# Patient Record
Sex: Female | Born: 1960 | Race: White | Hispanic: No | Marital: Married | Smoking: Never smoker
Health system: Southern US, Community
[De-identification: ages and names within clinical notes are randomized; demographics above are authoritative.]

## PROBLEM LIST (undated history)

## (undated) DIAGNOSIS — Z8041 Family history of malignant neoplasm of ovary: Secondary | ICD-10-CM

## (undated) DIAGNOSIS — Z807 Family history of other malignant neoplasms of lymphoid, hematopoietic and related tissues: Secondary | ICD-10-CM

## (undated) DIAGNOSIS — Z803 Family history of malignant neoplasm of breast: Secondary | ICD-10-CM

## (undated) DIAGNOSIS — K219 Gastro-esophageal reflux disease without esophagitis: Secondary | ICD-10-CM

## (undated) DIAGNOSIS — M542 Cervicalgia: Secondary | ICD-10-CM

## (undated) DIAGNOSIS — Z923 Personal history of irradiation: Secondary | ICD-10-CM

## (undated) DIAGNOSIS — F419 Anxiety disorder, unspecified: Secondary | ICD-10-CM

## (undated) DIAGNOSIS — C801 Malignant (primary) neoplasm, unspecified: Secondary | ICD-10-CM

## (undated) DIAGNOSIS — M1990 Unspecified osteoarthritis, unspecified site: Secondary | ICD-10-CM

## (undated) DIAGNOSIS — Z800 Family history of malignant neoplasm of digestive organs: Secondary | ICD-10-CM

## (undated) DIAGNOSIS — E039 Hypothyroidism, unspecified: Secondary | ICD-10-CM

## (undated) HISTORY — PX: CHOLECYSTECTOMY: SHX55

## (undated) HISTORY — DX: Family history of other malignant neoplasms of lymphoid, hematopoietic and related tissues: Z80.7

## (undated) HISTORY — DX: Unspecified osteoarthritis, unspecified site: M19.90

## (undated) HISTORY — DX: Family history of malignant neoplasm of digestive organs: Z80.0

## (undated) HISTORY — PX: TUBAL LIGATION: SHX77

## (undated) HISTORY — PX: ANKLE FRACTURE SURGERY: SHX122

## (undated) HISTORY — DX: Family history of malignant neoplasm of breast: Z80.3

## (undated) HISTORY — DX: Family history of malignant neoplasm of ovary: Z80.41

---

## 1998-09-01 ENCOUNTER — Other Ambulatory Visit: Admission: RE | Admit: 1998-09-01 | Discharge: 1998-09-01 | Payer: Self-pay | Admitting: Obstetrics & Gynecology

## 1999-12-17 ENCOUNTER — Other Ambulatory Visit: Admission: RE | Admit: 1999-12-17 | Discharge: 1999-12-17 | Payer: Self-pay | Admitting: Obstetrics & Gynecology

## 2001-04-03 ENCOUNTER — Other Ambulatory Visit: Admission: RE | Admit: 2001-04-03 | Discharge: 2001-04-03 | Payer: Self-pay | Admitting: Obstetrics & Gynecology

## 2001-04-23 ENCOUNTER — Encounter: Admission: RE | Admit: 2001-04-23 | Discharge: 2001-05-09 | Payer: Self-pay | Admitting: Orthopedic Surgery

## 2002-04-16 ENCOUNTER — Other Ambulatory Visit: Admission: RE | Admit: 2002-04-16 | Discharge: 2002-04-16 | Payer: Self-pay | Admitting: Obstetrics & Gynecology

## 2002-08-20 ENCOUNTER — Ambulatory Visit (HOSPITAL_BASED_OUTPATIENT_CLINIC_OR_DEPARTMENT_OTHER): Admission: RE | Admit: 2002-08-20 | Discharge: 2002-08-20 | Payer: Self-pay | Admitting: Orthopaedic Surgery

## 2003-08-19 ENCOUNTER — Other Ambulatory Visit: Admission: RE | Admit: 2003-08-19 | Discharge: 2003-08-19 | Payer: Self-pay | Admitting: Obstetrics & Gynecology

## 2004-08-23 ENCOUNTER — Other Ambulatory Visit: Admission: RE | Admit: 2004-08-23 | Discharge: 2004-08-23 | Payer: Self-pay | Admitting: Obstetrics & Gynecology

## 2004-12-21 ENCOUNTER — Ambulatory Visit: Payer: Self-pay | Admitting: Family Medicine

## 2005-02-01 ENCOUNTER — Ambulatory Visit: Payer: Self-pay | Admitting: Family Medicine

## 2005-07-27 ENCOUNTER — Ambulatory Visit: Payer: Self-pay | Admitting: Family Medicine

## 2005-09-21 ENCOUNTER — Other Ambulatory Visit: Admission: RE | Admit: 2005-09-21 | Discharge: 2005-09-21 | Payer: Self-pay | Admitting: Obstetrics & Gynecology

## 2006-01-04 ENCOUNTER — Ambulatory Visit: Payer: Self-pay | Admitting: Family Medicine

## 2006-11-17 ENCOUNTER — Ambulatory Visit: Payer: Self-pay | Admitting: Family Medicine

## 2006-11-27 ENCOUNTER — Ambulatory Visit: Payer: Self-pay | Admitting: Family Medicine

## 2010-06-17 ENCOUNTER — Encounter: Admission: RE | Admit: 2010-06-17 | Discharge: 2010-06-17 | Payer: Self-pay | Admitting: Obstetrics & Gynecology

## 2011-01-24 ENCOUNTER — Other Ambulatory Visit: Payer: Self-pay | Admitting: Obstetrics & Gynecology

## 2011-01-27 ENCOUNTER — Other Ambulatory Visit: Payer: Self-pay | Admitting: Obstetrics & Gynecology

## 2011-01-27 DIAGNOSIS — Z1231 Encounter for screening mammogram for malignant neoplasm of breast: Secondary | ICD-10-CM

## 2011-02-02 ENCOUNTER — Ambulatory Visit
Admission: RE | Admit: 2011-02-02 | Discharge: 2011-02-02 | Disposition: A | Discharge status: Home / Self Care | Payer: BC Managed Care – PPO | Source: Ambulatory Visit | Attending: Obstetrics & Gynecology | Admitting: Obstetrics & Gynecology

## 2011-02-02 DIAGNOSIS — Z1231 Encounter for screening mammogram for malignant neoplasm of breast: Secondary | ICD-10-CM

## 2011-03-25 NOTE — Op Note (Signed)
NAME:  Dawn Rogers, Dawn Rogers                          ACCOUNT NO.:  [id number]   MEDICAL RECORD NO.:  [id number]                   PATIENT TYPE:  AMB   LOCATION:  DSC                                  FACILITY:  [facility]   PHYSICIAN:  Lubertha Basque. Jerl Santos, M.D.             DATE OF BIRTH:  1960/12/08   DATE OF PROCEDURE:  DATE OF DISCHARGE:                                 OPERATIVE REPORT   PREOPERATIVE DIAGNOSIS:  Left ankle bimalleolar fracture.   POSTOPERATIVE DIAGNOSIS:  Left ankle bimalleolar fracture.   PROCEDURE:  Open reduction, internal fixation, left ankle bimalleolar  fracture.   ATTENDING SURGEON:  Lubertha Basque. Jerl Santos, M.D.   ASSISTANT:  Lindwood Qua, PA   ANESTHESIA:  General.   INDICATIONS FOR PROCEDURE:  The patient is a 50 year old woman, who fell  about five days ago and twisted her ankle.  She suffered a displaced  bimalleolar ankle fracture.  She was seen in the office a few days ago and  was offered ORIF, in hopes of realigning her joint and minimizing chance of  degenerative arthritis once this heals.  The procedure was discussed with  the patient and informed operative consent was obtained after discussing the  possibility complications of reaction to anesthesia, infection,  neurovascular injury, and nonunion.   DESCRIPTION OF PROCEDURE:  The patient was taken to the operating suite,  where general anesthetic was induced without difficulty.  She was positioned  supine with a bump under the left hip.  She was prepped and draped in the  normal sterile fashion.  After administration of IV antibiotic, the left leg  was elevated, exsanguinated, and a tourniquet inflated about the calf.  A  lateral incision was made with dissection down to the fibular fracture.  This was reduced by manipulation with a crab claw forceps.  Once this was  brought out to length, fluoroscopy was used to confirm adequacy of  reduction.  I then stabilized this with a six hole, one-third tubular  side  plate with good purchase achieved on each of the six screws, three of which  were on either side of the fracture.   Attention was then turned toward the medial aspect.  A separate incision was  made there with dissection down to the large medial malleolar fracture which  was displaced about 5 mm.  This was reduced anatomically and stabilized with  two partially threaded cancellous screws, also from the Synthes set.  Fluoroscopy was again used to confirm adequacy of reduction and placement of  hardware.  We did end up changing one of the lower screws on the fibula  plate, as it appeared to stick into the mortise a millimeter or two.  The  wounds were then irrigated.  I made some final x-ray views and saved these,  which I read myself.  The tourniquet was deflated and a mild amount of  bleeding was easily controlled with  Bovie cautery.  Subcutaneous tissues  were approximated with 2-0 undyed Vicryl and skin was closed with staples on  both sides.  Marcaine was injected about the wounds, followed by Adaptic  with a dry gauze dressing and a posterior splint of plaster with the ankle  in neutral position.  Estimated blood use and intraoperative fluids can be  obtained from Anesthesia records.  Accurate tourniquet which was less than  an hour.   DISPOSITION:  The patient was extubated in the operating room and taken to  recovery room in stable condition.  The plans were for her to go home the  same day and follow up in the office in less than a week.  I will contact  her by phone tonight.                                               Lubertha Basque Jerl Santos, M.D.    PGD/MEDQ  D:  08/20/2002  T:  08/21/2002  Job:  [id number]

## 2011-07-01 ENCOUNTER — Other Ambulatory Visit: Payer: Self-pay | Admitting: Family Medicine

## 2011-07-01 DIAGNOSIS — M5412 Radiculopathy, cervical region: Secondary | ICD-10-CM

## 2011-07-04 ENCOUNTER — Ambulatory Visit
Admission: RE | Admit: 2011-07-04 | Discharge: 2011-07-04 | Disposition: A | Discharge status: Home / Self Care | Payer: BC Managed Care – PPO | Source: Ambulatory Visit | Attending: Family Medicine | Admitting: Family Medicine

## 2011-07-04 DIAGNOSIS — M5412 Radiculopathy, cervical region: Secondary | ICD-10-CM

## 2011-07-18 ENCOUNTER — Ambulatory Visit: Payer: BC Managed Care – PPO | Attending: Family Medicine | Admitting: Physical Therapy

## 2011-07-18 DIAGNOSIS — M2560 Stiffness of unspecified joint, not elsewhere classified: Secondary | ICD-10-CM | POA: Insufficient documentation

## 2011-07-18 DIAGNOSIS — IMO0001 Reserved for inherently not codable concepts without codable children: Secondary | ICD-10-CM | POA: Insufficient documentation

## 2011-07-18 DIAGNOSIS — M542 Cervicalgia: Secondary | ICD-10-CM | POA: Insufficient documentation

## 2011-07-18 DIAGNOSIS — R5381 Other malaise: Secondary | ICD-10-CM | POA: Insufficient documentation

## 2011-07-21 ENCOUNTER — Ambulatory Visit: Payer: BC Managed Care – PPO | Admitting: Physical Therapy

## 2011-07-26 ENCOUNTER — Ambulatory Visit: Payer: BC Managed Care – PPO | Admitting: Physical Therapy

## 2011-07-29 ENCOUNTER — Ambulatory Visit: Payer: BC Managed Care – PPO | Admitting: *Deleted

## 2011-08-01 ENCOUNTER — Ambulatory Visit: Payer: BC Managed Care – PPO | Admitting: Physical Therapy

## 2011-08-03 ENCOUNTER — Ambulatory Visit: Payer: BC Managed Care – PPO | Admitting: Physical Therapy

## 2011-08-04 ENCOUNTER — Encounter: Payer: BC Managed Care – PPO | Admitting: Physical Therapy

## 2011-08-09 ENCOUNTER — Ambulatory Visit: Payer: BC Managed Care – PPO | Attending: Family Medicine | Admitting: *Deleted

## 2011-08-09 DIAGNOSIS — R5381 Other malaise: Secondary | ICD-10-CM | POA: Insufficient documentation

## 2011-08-09 DIAGNOSIS — M2560 Stiffness of unspecified joint, not elsewhere classified: Secondary | ICD-10-CM | POA: Insufficient documentation

## 2011-08-09 DIAGNOSIS — IMO0001 Reserved for inherently not codable concepts without codable children: Secondary | ICD-10-CM | POA: Insufficient documentation

## 2011-08-09 DIAGNOSIS — M542 Cervicalgia: Secondary | ICD-10-CM | POA: Insufficient documentation

## 2011-08-11 ENCOUNTER — Ambulatory Visit: Payer: BC Managed Care – PPO | Admitting: *Deleted

## 2011-08-15 ENCOUNTER — Ambulatory Visit: Payer: BC Managed Care – PPO | Admitting: Physical Therapy

## 2011-08-18 ENCOUNTER — Ambulatory Visit: Payer: BC Managed Care – PPO | Admitting: *Deleted

## 2011-08-23 ENCOUNTER — Ambulatory Visit: Payer: BC Managed Care – PPO | Admitting: Physical Therapy

## 2011-08-25 ENCOUNTER — Ambulatory Visit: Payer: BC Managed Care – PPO | Admitting: Physical Therapy

## 2011-08-29 ENCOUNTER — Encounter: Payer: BC Managed Care – PPO | Admitting: Physical Therapy

## 2011-08-31 ENCOUNTER — Encounter: Payer: BC Managed Care – PPO | Admitting: Physical Therapy

## 2012-01-13 ENCOUNTER — Other Ambulatory Visit: Payer: Self-pay | Admitting: Obstetrics & Gynecology

## 2012-01-13 DIAGNOSIS — Z1231 Encounter for screening mammogram for malignant neoplasm of breast: Secondary | ICD-10-CM

## 2012-02-07 ENCOUNTER — Ambulatory Visit
Admission: RE | Admit: 2012-02-07 | Discharge: 2012-02-07 | Disposition: A | Discharge status: Home / Self Care | Payer: BC Managed Care – PPO | Source: Ambulatory Visit | Attending: Obstetrics & Gynecology | Admitting: Obstetrics & Gynecology

## 2012-02-07 DIAGNOSIS — Z1231 Encounter for screening mammogram for malignant neoplasm of breast: Secondary | ICD-10-CM

## 2012-12-28 ENCOUNTER — Other Ambulatory Visit: Payer: Self-pay | Admitting: Obstetrics & Gynecology

## 2012-12-28 DIAGNOSIS — Z1231 Encounter for screening mammogram for malignant neoplasm of breast: Secondary | ICD-10-CM

## 2013-02-13 ENCOUNTER — Ambulatory Visit
Admission: RE | Admit: 2013-02-13 | Discharge: 2013-02-13 | Disposition: A | Discharge status: Home / Self Care | Payer: BC Managed Care – PPO | Source: Ambulatory Visit | Attending: Obstetrics & Gynecology | Admitting: Obstetrics & Gynecology

## 2013-02-13 DIAGNOSIS — Z1231 Encounter for screening mammogram for malignant neoplasm of breast: Secondary | ICD-10-CM

## 2014-02-12 ENCOUNTER — Other Ambulatory Visit: Payer: Self-pay | Admitting: Obstetrics & Gynecology

## 2014-02-12 DIAGNOSIS — Z1231 Encounter for screening mammogram for malignant neoplasm of breast: Secondary | ICD-10-CM

## 2014-02-21 ENCOUNTER — Other Ambulatory Visit: Payer: Self-pay | Admitting: Obstetrics & Gynecology

## 2014-02-21 DIAGNOSIS — N644 Mastodynia: Secondary | ICD-10-CM

## 2014-03-03 DIAGNOSIS — E039 Hypothyroidism, unspecified: Secondary | ICD-10-CM | POA: Insufficient documentation

## 2014-03-04 ENCOUNTER — Ambulatory Visit
Admission: RE | Admit: 2014-03-04 | Discharge: 2014-03-04 | Disposition: A | Discharge status: Home / Self Care | Payer: BC Managed Care – PPO | Source: Ambulatory Visit | Attending: Obstetrics & Gynecology | Admitting: Obstetrics & Gynecology

## 2014-03-04 DIAGNOSIS — N644 Mastodynia: Secondary | ICD-10-CM

## 2014-10-03 NOTE — Care Management Note (Signed)
    Page 1 of 1   10/01/2014     5:02:00 PM CARE MANAGEMENT NOTE 10/01/2014  Patient:  Dawn Rogers,Dawn Rogers   Account Number:  [id number]  Date Initiated:  10/01/2014  Documentation initiated by:  Kemaya Dorner  Subjective/Objective Assessment:   hypotension     Action/Plan:   CM to follow for disposition needs   Anticipated DC Date:  10/03/2014   Anticipated DC Plan:  HOME/SELF CARE         Choice offered to / List presented to:             Status of service:  Completed, signed off Medicare Important Message given?  YES (If response is "NO", the following Medicare IM given date fields will be blank) Date Medicare IM given:  10/01/2014 Medicare IM given by:  Saga Balthazar Date Additional Medicare IM given:   Additional Medicare IM given by:    Discharge Disposition:  HOME/SELF CARE  Per UR Regulation:  Reviewed for med. necessity/level of care/duration of stay  If discussed at [place] of Stay Meetings, dates discussed:    Comments:  Dawn Brock RN, BSN, M[facility], CCM  Nurse - Case Manager,  ([facility] [place])  [phone]  10/01/2014 Social:  From home Home DME:  Home oxygen active. Dispo Plan:  Home / [place] CM will continue to monitor for disposition needs

## 2015-03-02 ENCOUNTER — Other Ambulatory Visit: Payer: Self-pay

## 2015-03-02 DIAGNOSIS — Z1231 Encounter for screening mammogram for malignant neoplasm of breast: Secondary | ICD-10-CM

## 2015-03-11 ENCOUNTER — Other Ambulatory Visit: Payer: Self-pay | Admitting: Obstetrics & Gynecology

## 2015-03-11 ENCOUNTER — Other Ambulatory Visit: Payer: Self-pay

## 2015-03-11 ENCOUNTER — Ambulatory Visit
Admission: RE | Admit: 2015-03-11 | Discharge: 2015-03-11 | Disposition: A | Discharge status: Home / Self Care | Payer: BC Managed Care – PPO | Source: Ambulatory Visit

## 2015-03-11 ENCOUNTER — Encounter (INDEPENDENT_AMBULATORY_CARE_PROVIDER_SITE_OTHER): Payer: Self-pay

## 2015-03-11 DIAGNOSIS — R234 Changes in skin texture: Secondary | ICD-10-CM

## 2015-03-11 DIAGNOSIS — Z1231 Encounter for screening mammogram for malignant neoplasm of breast: Secondary | ICD-10-CM

## 2015-10-07 ENCOUNTER — Other Ambulatory Visit: Payer: Self-pay | Admitting: Obstetrics & Gynecology

## 2015-10-07 DIAGNOSIS — N6459 Other signs and symptoms in breast: Secondary | ICD-10-CM

## 2015-10-16 ENCOUNTER — Ambulatory Visit
Admission: RE | Admit: 2015-10-16 | Discharge: 2015-10-16 | Disposition: A | Discharge status: Home / Self Care | Payer: BC Managed Care – PPO | Source: Ambulatory Visit | Attending: Obstetrics & Gynecology | Admitting: Obstetrics & Gynecology

## 2015-10-16 DIAGNOSIS — N6459 Other signs and symptoms in breast: Secondary | ICD-10-CM

## 2016-06-21 ENCOUNTER — Other Ambulatory Visit: Payer: Self-pay | Admitting: Obstetrics & Gynecology

## 2016-06-21 DIAGNOSIS — N6310 Unspecified lump in the right breast, unspecified quadrant: Secondary | ICD-10-CM

## 2016-06-24 ENCOUNTER — Other Ambulatory Visit: Payer: BC Managed Care – PPO

## 2016-06-29 ENCOUNTER — Ambulatory Visit
Admission: RE | Admit: 2016-06-29 | Discharge: 2016-06-29 | Disposition: A | Discharge status: Home / Self Care | Payer: BC Managed Care – PPO | Source: Ambulatory Visit | Attending: Obstetrics & Gynecology | Admitting: Obstetrics & Gynecology

## 2016-06-29 DIAGNOSIS — N6310 Unspecified lump in the right breast, unspecified quadrant: Secondary | ICD-10-CM

## 2016-12-06 ENCOUNTER — Other Ambulatory Visit: Payer: Self-pay | Admitting: Obstetrics & Gynecology

## 2016-12-06 DIAGNOSIS — N6310 Unspecified lump in the right breast, unspecified quadrant: Secondary | ICD-10-CM

## 2017-01-03 ENCOUNTER — Other Ambulatory Visit: Payer: BC Managed Care – PPO

## 2017-01-03 ENCOUNTER — Inpatient Hospital Stay: Admission: RE | Admit: 2017-01-03 | Payer: BC Managed Care – PPO | Source: Ambulatory Visit

## 2017-01-11 ENCOUNTER — Ambulatory Visit
Admission: RE | Admit: 2017-01-11 | Discharge: 2017-01-11 | Disposition: A | Discharge status: Home / Self Care | Payer: BC Managed Care – PPO | Source: Ambulatory Visit | Attending: Obstetrics & Gynecology | Admitting: Obstetrics & Gynecology

## 2017-01-11 DIAGNOSIS — N6310 Unspecified lump in the right breast, unspecified quadrant: Secondary | ICD-10-CM

## 2017-06-02 ENCOUNTER — Other Ambulatory Visit: Payer: Self-pay | Admitting: Obstetrics & Gynecology

## 2017-06-02 DIAGNOSIS — Z1231 Encounter for screening mammogram for malignant neoplasm of breast: Secondary | ICD-10-CM

## 2017-07-06 ENCOUNTER — Ambulatory Visit
Admission: RE | Admit: 2017-07-06 | Discharge: 2017-07-06 | Disposition: A | Discharge status: Home / Self Care | Payer: BC Managed Care – PPO | Source: Ambulatory Visit | Attending: Obstetrics & Gynecology | Admitting: Obstetrics & Gynecology

## 2017-07-06 ENCOUNTER — Encounter: Payer: Self-pay | Admitting: Radiology

## 2017-07-06 DIAGNOSIS — Z1231 Encounter for screening mammogram for malignant neoplasm of breast: Secondary | ICD-10-CM

## 2018-07-11 ENCOUNTER — Other Ambulatory Visit: Payer: Self-pay | Admitting: Obstetrics & Gynecology

## 2018-07-11 DIAGNOSIS — Z1231 Encounter for screening mammogram for malignant neoplasm of breast: Secondary | ICD-10-CM

## 2018-07-12 ENCOUNTER — Ambulatory Visit
Admission: RE | Admit: 2018-07-12 | Discharge: 2018-07-12 | Disposition: A | Discharge status: Home / Self Care | Payer: BC Managed Care – PPO | Source: Ambulatory Visit | Attending: Family Medicine | Admitting: Family Medicine

## 2018-07-12 ENCOUNTER — Other Ambulatory Visit: Payer: Self-pay | Admitting: Family Medicine

## 2018-07-12 ENCOUNTER — Ambulatory Visit
Admission: RE | Admit: 2018-07-12 | Discharge: 2018-07-12 | Disposition: A | Discharge status: Home / Self Care | Payer: BC Managed Care – PPO | Source: Ambulatory Visit | Attending: Obstetrics & Gynecology | Admitting: Obstetrics & Gynecology

## 2018-07-12 DIAGNOSIS — M542 Cervicalgia: Secondary | ICD-10-CM

## 2018-07-12 DIAGNOSIS — Z1231 Encounter for screening mammogram for malignant neoplasm of breast: Secondary | ICD-10-CM

## 2018-07-16 ENCOUNTER — Other Ambulatory Visit: Payer: Self-pay | Admitting: Obstetrics & Gynecology

## 2018-07-16 ENCOUNTER — Other Ambulatory Visit: Payer: Self-pay | Admitting: Family Medicine

## 2018-07-16 DIAGNOSIS — R928 Other abnormal and inconclusive findings on diagnostic imaging of breast: Secondary | ICD-10-CM

## 2018-07-16 DIAGNOSIS — M5090 Cervical disc disorder, unspecified, unspecified cervical region: Secondary | ICD-10-CM

## 2018-07-16 DIAGNOSIS — M542 Cervicalgia: Secondary | ICD-10-CM

## 2018-07-19 ENCOUNTER — Ambulatory Visit
Admission: RE | Admit: 2018-07-19 | Discharge: 2018-07-19 | Disposition: A | Discharge status: Home / Self Care | Payer: BC Managed Care – PPO | Source: Ambulatory Visit | Attending: Obstetrics & Gynecology | Admitting: Obstetrics & Gynecology

## 2018-07-19 ENCOUNTER — Other Ambulatory Visit: Payer: Self-pay | Admitting: Obstetrics & Gynecology

## 2018-07-19 DIAGNOSIS — R928 Other abnormal and inconclusive findings on diagnostic imaging of breast: Secondary | ICD-10-CM

## 2018-07-19 DIAGNOSIS — N6489 Other specified disorders of breast: Secondary | ICD-10-CM

## 2018-07-21 ENCOUNTER — Ambulatory Visit
Admission: RE | Admit: 2018-07-21 | Discharge: 2018-07-21 | Disposition: A | Discharge status: Home / Self Care | Payer: BC Managed Care – PPO | Source: Ambulatory Visit | Attending: Family Medicine | Admitting: Family Medicine

## 2018-07-21 DIAGNOSIS — M5090 Cervical disc disorder, unspecified, unspecified cervical region: Secondary | ICD-10-CM

## 2018-07-21 DIAGNOSIS — M542 Cervicalgia: Secondary | ICD-10-CM

## 2018-07-23 ENCOUNTER — Ambulatory Visit
Admission: RE | Admit: 2018-07-23 | Discharge: 2018-07-23 | Disposition: A | Discharge status: Home / Self Care | Payer: BC Managed Care – PPO | Source: Ambulatory Visit | Attending: Obstetrics & Gynecology | Admitting: Obstetrics & Gynecology

## 2018-07-23 DIAGNOSIS — N6489 Other specified disorders of breast: Secondary | ICD-10-CM

## 2018-07-24 ENCOUNTER — Encounter: Payer: Self-pay | Admitting: Student

## 2018-07-26 ENCOUNTER — Telehealth: Payer: Self-pay | Admitting: Hematology

## 2018-07-26 NOTE — Telephone Encounter (Signed)
Spoke to patient to confirm afternoon [facility] appointment for 9/25, packet will be emailed to patient

## 2018-07-27 ENCOUNTER — Telehealth: Payer: Self-pay | Admitting: Hematology

## 2018-07-27 ENCOUNTER — Encounter: Payer: Self-pay | Admitting: *Deleted

## 2018-07-27 DIAGNOSIS — Z170 Estrogen receptor positive status [ER+]: Principal | ICD-10-CM

## 2018-07-27 DIAGNOSIS — C50212 Malignant neoplasm of upper-inner quadrant of left female breast: Secondary | ICD-10-CM | POA: Insufficient documentation

## 2018-07-27 NOTE — Telephone Encounter (Signed)
Spoke with patient to confirm afternoon BC for 9/25, packet e-mailed to patient

## 2018-07-31 ENCOUNTER — Encounter: Payer: Self-pay | Admitting: General Surgery

## 2018-07-31 NOTE — Progress Notes (Signed)
[place]   [phone] Fax:([phone] [place] Note   Patient Care Team: Dione Housekeeper, MD as PCP - General (Family Medicine) Fanny Skates, MD as Consulting Physician (General Surgery) Truitt Merle, MD as Consulting Physician (Hematology) Gery Pray, MD as Consulting Physician (Radiation Oncology) 08/01/2018  Referral from: [facility]:  Newly diagnosed left breast cancer   Oncology History   Cancer Staging Malignant neoplasm of upper-inner quadrant of left breast in female, estrogen receptor positive ([place]) Staging form: Breast, AJCC 8th Edition - Clinical stage from 07/23/2018: Stage IA (cT1b, cN0, cM0, G1, ER+, PR+, HER2-) - Signed by Truitt Merle, MD on 08/01/2018       Malignant neoplasm of upper-inner quadrant of left breast in female, estrogen receptor positive ([place])   07/13/2018 Mammogram    07/13/2018 Screening Mammogram IMPRESSION: Further evaluation is suggested for possible distortion in the left breast    07/19/2018 Breast US     07/19/2018 Breast US IMPRESSION: 0.6 cm irregular mass with associated architectural distortion in the 9:30 position of the left breast 4 cm from the nipple. Findings are suspicious for malignancy.  Negative for left axillary lymphadenopathy    07/19/2018 Mammogram    07/19/2018 Diagnostic Mammogram IMPRESSION: 0.6 cm irregular mass with associated architectural distortion in the 9:30 position of the left breast 4 cm from the nipple. Findings are suspicious for malignancy.  Negative for left axillary lymphadenopathy.    07/23/2018 Cancer Staging    Staging form: Breast, AJCC 8th Edition - Clinical stage from 07/23/2018: Stage IA (cT1b, cN0, cM0, G1, ER+, PR+, HER2-) - Signed by Truitt Merle, MD on 08/01/2018    07/23/2018 Receptors her2    ADDITIONAL INFORMATION: PROGNOSTIC INDICATORS Results: IMMUNOHISTOCHEMICAL AND MORPHOMETRIC ANALYSIS  PERFORMED MANUALLY The tumor cells are Negative for Her2 (1+). Estrogen Receptor: 90%, POSITIVE, STRONG STAINING INTENSITY Progesterone Receptor: 90%, POSITIVE, STRONG STAINING INTENSITY Proliferation Marker Ki67: 10%    07/23/2018 Pathology Results    Diagnosis Breast, left, needle core biopsy, 9:30 o'clock, 4 cm fn - INVASIVE DUCTAL CARCINOMA, GRADE I. SEE NOTE.    07/27/2018 Initial Diagnosis    Malignant neoplasm of upper-inner quadrant of left breast in female, estrogen receptor positive ([place])      HISTORY OF PRESENTING ILLNESS:  Dawn Rogers 57 y.o. female is here because of newly diagnosed left breast cancer.   She had routine screening mammography on 07/13/2018 at the breast center with results showing possible distortion in the left breast.. She underwent left diagnostic mammography with tomography and left breast ultrasonography at [facility] on 07/19/2018 showing a 0.6 cm irregular mass with associated architectural distortion in the 9:30 position of the left breast 4 cm from the nipple. Findings are suspicious for malignancy. It was negative for left axillary lymphadenopathy.  Accordingly on 07/23/2018 she proceeded to left breast biopsy with pathology confirming left invasive ductal carcinoma.  Today, she is here with her family members.She feels well today and denied feeling tired. She states that she has multiple impinged nerves in her neck, for which she tried steroid injections and PT. She can still use her hands, but states that she feels that she lost some of her hand strength.   Family history is positive for multiple cancers.     GYN HISTORY  Menarchal: 13 LMP: March 2019, was irregular before G2P2: 2 pregnancies   MEDICAL HISTORY:  History reviewed. No pertinent past medical history.  SURGICAL HISTORY:  Past Surgical History:  Procedure Laterality Date  . CHOLECYSTECTOMY    . TUBAL LIGATION      SOCIAL HISTORY: Social History   Socioeconomic  History  . Marital status: Married    Spouse name: Not on file  . Number of children: Not on file  . Years of education: Not on file  . Highest education level: Not on file  Occupational History  . Not on file  Social Needs  . Financial resource strain: Not on file  . Food insecurity:    Worry: Not on file    Inability: Not on file  . Transportation needs:    Medical: Not on file    Non-medical: Not on file  Tobacco Use  . Smoking status: Never Smoker  . Smokeless tobacco: Never Used  Substance and Sexual Activity  . Alcohol use: Never    Frequency: Never  . Drug use: Never  . Sexual activity: Not on file  Lifestyle  . Physical activity:    Days per week: Not on file    Minutes per session: Not on file  . Stress: Not on file  Relationships  . Social connections:    Talks on phone: Not on file    Gets together: Not on file    Attends religious service: Not on file    Active member of club or organization: Not on file    Attends meetings of clubs or organizations: Not on file    Relationship status: Not on file  . Intimate partner violence:    Fear of current or ex partner: Not on file    Emotionally abused: Not on file    Physically abused: Not on file    Forced sexual activity: Not on file  Other Topics Concern  . Not on file  Social History Narrative  . Not on file    FAMILY HISTORY: Family History  Problem Relation Age of Onset  . Breast cancer Sister 84  . Lung cancer Sister   . Leukemia Mother        ? possible leukemia?    . Cancer Paternal Grandfather        unsure kind of cancer  . Lymphoma Daughter   . Cancer Paternal Aunt        unsure kind of cancer   . Lung cancer Paternal Aunt   . Esophageal cancer Father   . Pancreatic cancer Paternal Aunt     ALLERGIES:  has No Known Allergies.  MEDICATIONS:  Current Outpatient Medications  Medication Sig Dispense Refill  . cyclobenzaprine (FLEXERIL) 5 MG tablet Take 5 mg by mouth 3 (three) times  daily as needed for muscle spasms.    . ergocalciferol (VITAMIN D2) 50000 units capsule Take 50,000 Units by mouth once a week.    Marland Kitchen FLUoxetine (PROZAC) 10 MG capsule Take 30 mg by mouth daily.    Marland Kitchen levothyroxine (SYNTHROID, LEVOTHROID) 25 MCG tablet Take 25 mcg by mouth daily before breakfast.    . LORazepam (ATIVAN) 0.5 MG tablet Take 0.5 mg by mouth every 6 (six) hours as needed for anxiety.    Marland Kitchen omeprazole (PRILOSEC) 20 MG capsule Take 20 mg by mouth daily.     No current facility-administered medications for this visit.     REVIEW OF SYSTEMS:   Constitutional: Denies fevers, chills or abnormal night sweats Eyes: Denies blurriness of vision, double vision or watery eyes Ears, nose, mouth, throat, and face: Denies mucositis or sore throat Respiratory: Denies cough, dyspnea  or wheezes Cardiovascular: Denies palpitation, chest discomfort or lower extremity swelling Gastrointestinal:  Denies nausea, heartburn or change in bowel habits Skin: Denies abnormal skin rashes Lymphatics: Denies new lymphadenopathy or easy bruising Neurological:Denies numbness, tingling or new weaknesses (+) cervical nerve impingement with mild bilateral hand weakness Behavioral/Psych: Mood is stable, no new changes  All other systems were reviewed with the patient and are negative.  PHYSICAL EXAMINATION:  ECOG PERFORMANCE STATUS: 0 - Asymptomatic  Vitals:   08/01/18 1318  BP: 139/69  Pulse: (!) 58  Resp: 18  Temp: 98.2 F (36.8 C)  SpO2: 100%   Filed Weights   08/01/18 1318  Weight: 158 lb 12.8 oz (72 kg)    GENERAL:alert, no distress and comfortable SKIN: skin color, texture, turgor are normal, no rashes or significant lesions EYES: normal, conjunctiva are pink and non-injected, sclera clear OROPHARYNX:no exudate, no erythema and lips, buccal mucosa, and tongue normal  NECK: supple, thyroid normal size, non-tender, without nodularity LYMPH:  no palpable lymphadenopathy in the cervical, axillary  or inguinal LUNGS: clear to auscultation and percussion with normal breathing effort HEART: regular rate & rhythm and no murmurs and no lower extremity edema ABDOMEN:abdomen soft, non-tender and normal bowel sounds Musculoskeletal:no cyanosis of digits and no clubbing  PSYCH: alert & oriented x 3 with fluent speech NEURO: no focal motor/sensory deficits BREAST: No palpable masses, skin changes, nipple discharge or retraction, or obvious asymmetry   LABORATORY DATA:  I have reviewed the data as listed CBC Latest Ref Rng & Units 08/01/2018  WBC 3.9 - 10.3 K/uL 6.9  Hemoglobin 11.6 - 15.9 g/dL 13.3  Hematocrit 34.8 - 46.6 % 39.6  Platelets 145 - 400 K/uL 255    CMP Latest Ref Rng & Units 08/01/2018  Glucose 70 - 99 mg/dL 90  BUN 6 - 20 mg/dL 10  Creatinine 0.44 - 1.00 mg/dL 0.83  Sodium 135 - 145 mmol/L 139  Potassium 3.5 - 5.1 mmol/L 4.3  Chloride 98 - 111 mmol/L 105  CO2 22 - 32 mmol/L 27  Calcium 8.9 - 10.3 mg/dL 8.9  Total Protein 6.5 - 8.1 g/dL 6.3(L)  Total Bilirubin 0.3 - 1.2 mg/dL 0.4  Alkaline Phos 38 - 126 U/L 62  AST 15 - 41 U/L 18  ALT 0 - 44 U/L 23    PATHOLOGY:   07/23/2018 Surgical Pathology  ADDITIONAL INFORMATION: PROGNOSTIC INDICATORS Results: IMMUNOHISTOCHEMICAL AND MORPHOMETRIC ANALYSIS PERFORMED MANUALLY The tumor cells are Negative for Her2 (1+). Estrogen Receptor: 90%, POSITIVE, STRONG STAINING INTENSITY Progesterone Receptor: 90%, POSITIVE, STRONG STAINING INTENSITY Proliferation Marker Ki67: 10% REFERENCE RANGE ESTROGEN RECEPTOR NEGATIVE 0% POSITIVE =>1% REFERENCE RANGE PROGESTERONE RECEPTOR NEGATIVE 0% POSITIVE =>1% All controls stained appropriately  Diagnosis Breast, left, needle core biopsy, 9:30 o'clock, 4 cm fn - INVASIVE DUCTAL CARCINOMA, GRADE I. SEE NOTE.  Diagnosis Note Dr. Lyndon Code has reviewed this case and concurs with the above interpretation. Breast prognostic profile is pending and will be reported in an addendum. [facility] was notified on 07/24/18. (NDK:gt, 07/24/18)ical Information Specimen Comment In formalin at 2:30; extracted less than 5 min; 5 mm mass with distortion Specimen(s) Obtained: Breast, left, needle core biopsy, 9:30 o'clock, 4 cm fn Specimen Clinical Information [facility] Received in formalin labeled with the patient's name and "Left breast 9:30 4 cm FN", are 3 core(s) of tan-yellow, fibroadipose tissue, ranging from 0.8 x 0.2 x 0.2 cm to 1.4 x 0.2 x 0.2 cm. The specimen is entirely submitted in  1 cassette(s). Time in formalin 2:30 pm on 07/23/18. Cold ischemic time less than 5 mins. Craig Staggers 07/23/18 ) Stain(s) used in Diagnosis: The following stain(s) were used in diagnosing the case: ER-ACIS, PR-ACIS, Her2 by IHC, KI-67-ACIS. The control(s) stained appropriately.  RADIOGRAPHIC STUDIES: I have personally reviewed the radiological images as listed and agreed with the findings in the report.  07/19/2018 Breast US IMPRESSION: 0.6 cm irregular mass with associated architectural distortion in the 9:30 position of the left breast 4 cm from the nipple. Findings are suspicious for malignancy.  Negative for left axillary lymphadenopathy  07/19/2018 Diagnostic Mammogram IMPRESSION: 0.6 cm irregular mass with associated architectural distortion in the 9:30 position of the left breast 4 cm from the nipple. Findings are suspicious for malignancy.  Negative for left axillary lymphadenopathy.  07/13/2018 Screening Mammogram IMPRESSION: Further evaluation is suggested for possible distortion in the left breast   Dg Cervical Spine 2 Or 3 Views  Result Date: 07/13/2018 CLINICAL DATA:  Neck pain with radiation into both hands. No recent injury. EXAM: CERVICAL SPINE - 2-3 VIEW COMPARISON:  MRI 07/04/2011. FINDINGS: Diffuse severe multilevel degenerative change again noted of the cervical spine. Degenerative changes most prominent C5-C6-C6-C7. 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 No acute bony abnormality identified. No evidence of fracture or dislocation. Biapical pleural thickening noted consistent scarring. IMPRESSION: Diffuse severe multilevel degenerative change again noted throughout the cervical spine. Degenerative changes are most prominent at C5-C6 and C6-C7. 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 No acute abnormality identified. Electronically Signed   By: Marcello Moores  Register   On: 07/13/2018 06:44   Mr Cervical Spine Wo Contrast  Result Date: 07/21/2018 CLINICAL DATA:  Cervical disc disease and neck pain EXAM: MRI CERVICAL SPINE WITHOUT CONTRAST TECHNIQUE: Multiplanar, multisequence MR imaging of the cervical spine was performed. No intravenous contrast was administered. COMPARISON:  07/12/2018 radiography and 07/04/2011 MRI FINDINGS: Alignment: Mild C4-5 anterolisthesis, accentuated by ridging Vertebrae: C3-4 incomplete segmentation across the posterior disc space. No fracture, discitis, or aggressive bone lesion. Mild marrow edema about the left C7-T1 facet. Cord: Negative Posterior Fossa, vertebral arteries, paraspinal tissues: Negative Disc levels: C2-3: Chronic bulky left degenerative facet spurring with moderate left foraminal narrowing. C3-4: Incomplete segmentation. No degenerative changes or impingement C4-5: Asymmetric bulky right degenerative facet spurring. Right uncovertebral ridging. Moderate right foraminal narrowing. Mild spinal stenosis C5-6: Disc narrowing with bulging and endplate/uncovertebral ridging. Biforaminal impingement. Mild noncompressive spinal stenosis. C6-7: Disc narrowing and bulging with asymmetric right uncovertebral ridging. Negative facets. Right foraminal narrowing that is mild by T2 and more moderate based on sagittal and gradient imaging. C7-T1:Facet arthropathy on the left with marrow edema. Negative disc. No impingement IMPRESSION: 1. Diffuse disc and facet degeneration except at the  partially segmented C3-4 level. 2. C2-3 moderate left foraminal narrowing. 3. C4-5 moderate right foraminal narrowing. 4. C5-6 advanced bilateral foraminal narrowing. 5. C6-7 mild to moderate right foraminal narrowing. 6. Mild and noncompressive spinal stenosis at C5-6 and C6-7. Electronically Signed   By: Monte Fantasia M.D.   On: 07/21/2018 10:07   US Breast Ltd Uni Left Inc Axilla  Result Date: 07/19/2018 CLINICAL DATA:  57 year old patient recalled from recent screening mammogram for possible distortion in the left breast. EXAM: DIGITAL DIAGNOSTIC LEFT MAMMOGRAM WITH CAD AND TOMO ULTRASOUND LEFT BREAST COMPARISON:  07/12/2018 ACR Breast Density Category d: The breast tissue is extremely dense, which lowers the sensitivity of mammography. FINDINGS: Spot compression views with tomography of the upper inner  quadrant of the left breast confirm an area of architectural distortion with a central mass. There are a few punctate calcifications within the mass. Mammographic images were processed with CAD. On physical exam, no mass is palpated in the upper inner quadrant of the left breast Targeted ultrasound is performed, showing an irregular hypoechoic mass at 9:30 position 4 cm from the nipple with a feeding vessel along the superficial margin. At least one of the punctate calcifications can be seen within the mass on ultrasound. Mass measures 0.6 x 0.5 x 0.5 cm and causes some distortion of the surrounding tissue. Within approximately 8 mm of this solid mass there is a 5 mm complicated cyst. Ultrasound of the left axilla is negative for lymphadenopathy. IMPRESSION: 0.6 cm irregular mass with associated architectural distortion in the 9:30 position of the left breast 4 cm from the nipple. Findings are suspicious for malignancy. Negative for left axillary lymphadenopathy. RECOMMENDATION: Ultrasound-guided left breast biopsy is recommended and is being scheduled for the patient. Today's findings and the procedure of  breast biopsy were discussed with the patient and her husband today. I have discussed the findings and recommendations with the patient. Results were also provided in writing at the conclusion of the visit. If applicable, a reminder letter will be sent to the patient regarding the next appointment. BI-RADS CATEGORY  5: Highly suggestive of malignancy. Electronically Signed   By: Curlene Dolphin M.D.   On: 07/19/2018 12:58   Mm Diag Breast Tomo Uni Left  Result Date: 07/19/2018 CLINICAL DATA:  57 year old patient recalled from recent screening mammogram for possible distortion in the left breast. EXAM: DIGITAL DIAGNOSTIC LEFT MAMMOGRAM WITH CAD AND TOMO ULTRASOUND LEFT BREAST COMPARISON:  07/12/2018 ACR Breast Density Category d: The breast tissue is extremely dense, which lowers the sensitivity of mammography. FINDINGS: Spot compression views with tomography of the upper inner quadrant of the left breast confirm an area of architectural distortion with a central mass. There are a few punctate calcifications within the mass. Mammographic images were processed with CAD. On physical exam, no mass is palpated in the upper inner quadrant of the left breast Targeted ultrasound is performed, showing an irregular hypoechoic mass at 9:30 position 4 cm from the nipple with a feeding vessel along the superficial margin. At least one of the punctate calcifications can be seen within the mass on ultrasound. Mass measures 0.6 x 0.5 x 0.5 cm and causes some distortion of the surrounding tissue. Within approximately 8 mm of this solid mass there is a 5 mm complicated cyst. Ultrasound of the left axilla is negative for lymphadenopathy. IMPRESSION: 0.6 cm irregular mass with associated architectural distortion in the 9:30 position of the left breast 4 cm from the nipple. Findings are suspicious for malignancy. Negative for left axillary lymphadenopathy. RECOMMENDATION: Ultrasound-guided left breast biopsy is recommended and is  being scheduled for the patient. Today's findings and the procedure of breast biopsy were discussed with the patient and her husband today. I have discussed the findings and recommendations with the patient. Results were also provided in writing at the conclusion of the visit. If applicable, a reminder letter will be sent to the patient regarding the next appointment. BI-RADS CATEGORY  5: Highly suggestive of malignancy. Electronically Signed   By: Curlene Dolphin M.D.   On: 07/19/2018 12:58   Mm 3d Screen Breast Bilateral  Result Date: 07/13/2018 CLINICAL DATA:  Screening. EXAM: DIGITAL SCREENING BILATERAL MAMMOGRAM WITH TOMO AND CAD COMPARISON:  Previous exam(s). ACR Breast Density  Category d: The breast tissue is extremely dense, which lowers the sensitivity of mammography. FINDINGS: In the left breast, possible distortion warrants further evaluation. In the right breast, no findings suspicious for malignancy. Images were processed with CAD. IMPRESSION: Further evaluation is suggested for possible distortion in the left breast. RECOMMENDATION: Diagnostic mammogram and possibly ultrasound of the left breast. (Code:[id number]-L-[id number]) The patient will be contacted regarding the findings, and additional imaging will be scheduled. BI-RADS CATEGORY  0: Incomplete. Need additional imaging evaluation and/or prior mammograms for comparison. Electronically Signed   By: Lovey Newcomer M.D.   On: 07/13/2018 13:17   Mm Clip Placement Left  Result Date: 07/23/2018 CLINICAL DATA:  Ultrasound-guided core needle biopsy was performed of a 5-6 mm mass in the upper inner quadrant of the left breast. EXAM: DIAGNOSTIC LEFT MAMMOGRAM POST ULTRASOUND BIOPSY COMPARISON:  Previous exam(s). FINDINGS: Mammographic images were obtained following ultrasound guided biopsy of left breast mass. The ribbon shaped biopsy clip is satisfactorily positioned within the mass/distortion, and confirmed on tomographic images in the MLO projection. 2D images were  performed in the CC and 90 degree lateral projections, and the clip is in good position. IMPRESSION: Satisfactory position of ribbon shaped biopsy clip in the left breast. Final Assessment: Post Procedure Mammograms for Marker Placement Electronically Signed   By: Curlene Dolphin M.D.   On: 07/23/2018 14:55   [place] Lt Breast Bx W Loc Dev 1st Lesion Img Bx Spec US Guide  Addendum Date: 07/30/2018   ADDENDUM REPORT: 07/25/2018 08:00 ADDENDUM: Pathology revealed GRADE I INVASIVE DUCTAL CARCINOMA, of the LEFT breast, 9:30 o'clock, 4 cm fn. This was found to be concordant by Dr. Curlene Dolphin. Pathology results were discussed with the patient by telephone. The patient reported doing well after the biopsy with tenderness at the site. Post biopsy instructions and care were reviewed and questions were answered. The patient was encouraged to call [facility] for any additional concerns. The patient was referred to [facility] on August 01, 2018. A bilateral breast MRI is recommended due to breast density and family history. Pathology results reported by Roselind Messier, RN on 07/25/2018. Electronically Signed   By: Curlene Dolphin M.D.   On: 07/25/2018 08:00   Result Date: 07/30/2018 CLINICAL DATA:  57 year old patient presents for biopsy of a an approximately 5-6 mm hypoechoic mass in the 9:30 position of the left breast 4 cm from the nipple. EXAM: ULTRASOUND GUIDED LEFT BREAST CORE NEEDLE BIOPSY COMPARISON:  Previous exam(s). FINDINGS: I met with the patient and we discussed the procedure of ultrasound-guided biopsy, including benefits and alternatives. We discussed the high likelihood of a successful procedure. We discussed the risks of the procedure, including infection, bleeding, tissue injury, clip migration, and inadequate sampling. Informed written consent was given. The usual time-out protocol was performed  immediately prior to the procedure. Lesion quadrant: Upper inner quadrant Using sterile technique and 1% Lidocaine as local anesthetic, under direct ultrasound visualization, a 12 gauge spring-loaded device was used to perform biopsy of a hypoechoic mass using a medial approach. At the conclusion of the procedure a tissue marker clip was deployed into the biopsy cavity. Follow up 2 view mammogram was performed and dictated separately. IMPRESSION: Ultrasound guided biopsy of the left breast. No apparent complications. Electronically Signed: By: Curlene Dolphin M.D. On: 07/23/2018 14:43    ASSESSMENT & PLAN:  Dawn Rogers is a 57 y.o. lovely Perimenopausal female  with a history of   1. Malignant Neoplasm of Upper-inner Quadrant of Left Breast. 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 Grade I -She had a screening Mammogram on 07/13/2018, which showed possible distortion in the left breast. -A diagnostic Mammogram on 07/19/2018 showed 0.6 cm irregular mass with associated architectural distortion in the 9:30 position of the left breast 4 cm from the nipple. Findings were suspicious for malignancy. -Pathology from left breast biopsy confirmed invasive ductal carcinoma, grade 1, ER PR strongly positive, and HER-2 negative. -Due to her very dense breast tissue, will obtain a breast MRI  -She saw Dr. Sondra Come and Dr Dalbert Batman. Radiation and surgery treatment options were discussed.  Is a candidate for lumpectomy and sentinel lymph node biopsy. -She also was offered genetic testing due to her strong family history of cancer. -I reviewed her imaging and pathology results and discussed treatment options. I discussed that she will likely not need chemotherapy, given the low-grade small tumor, and favorable biology (ER PR strongly positive and HER-2 negative).  -However if her tumor is more than 1 cm, or grade 2 and >45m, I will obtain Oncotype on her surgical sample to see if she would benefit from adjuvant  chemotherapy. ---Giving her strongly ER and PR positivity of the tumor cells, and her peri-menopause status, I recommend adjuvant endocrine therapy with tamoxifen. The potential side effects, which includes but not limited to, hot flash, skin and vaginal dryness, slightly increased risk of cardiovascular disease and cataract, small risk of thrombosis and endometrial cancer, were discussed with her in great details. Preventive strategies for thrombosis, such as being physically active, using compression stocks, avoid cigarette smoking, etc., were reviewed with her. I also recommend her to follow-up with her gynecologist once a year, and watch for vaginal spotting or bleeding, as a clinically sign of endometrial cancer, etc. She voiced good understanding, and agrees to proceed. Will start after she completes adjuvant breast radiation. -I reviewed her lab today, CBC and CMP unremarkable.   PLAN: -She will have genetic testing  -Breast MRI will be scheduled -She will have surgery with Dr. IDalbert Batmanand I will see her after her radiation  -no need Oncotype unless tumor >1cm or Grade 2-3 with tumor >59mon surgical path    No orders of the defined types were placed in this encounter.   All questions were answered. The patient knows to call the clinic with any problems, questions or concerns. I spent 35 minutes counseling the patient face to face. The total time spent in the appointment was 45 minutes and more than 50% was on counseling.     YaTruitt MerleMD 08/01/2018   I,Dierdre Searlesweik am acting as scribe for Dr. YaTruitt Merle I have reviewed the above documentation for accuracy and completeness, and I agree with the above.

## 2018-08-01 ENCOUNTER — Other Ambulatory Visit: Payer: Self-pay | Admitting: *Deleted

## 2018-08-01 ENCOUNTER — Other Ambulatory Visit: Payer: Self-pay | Admitting: General Surgery

## 2018-08-01 ENCOUNTER — Inpatient Hospital Stay: Payer: BC Managed Care – PPO | Attending: Hematology | Admitting: Hematology

## 2018-08-01 ENCOUNTER — Encounter: Payer: Self-pay | Admitting: Hematology

## 2018-08-01 ENCOUNTER — Inpatient Hospital Stay: Payer: BC Managed Care – PPO

## 2018-08-01 ENCOUNTER — Ambulatory Visit
Admission: RE | Admit: 2018-08-01 | Discharge: 2018-08-01 | Disposition: A | Discharge status: Home / Self Care | Payer: BC Managed Care – PPO | Source: Ambulatory Visit | Attending: Radiation Oncology | Admitting: Radiation Oncology

## 2018-08-01 VITALS — BP 139/69 | HR 58 | Temp 98.2°F | Resp 18 | Ht 64.0 in | Wt 158.8 lb

## 2018-08-01 DIAGNOSIS — C50212 Malignant neoplasm of upper-inner quadrant of left female breast: Secondary | ICD-10-CM

## 2018-08-01 DIAGNOSIS — Z170 Estrogen receptor positive status [ER+]: Principal | ICD-10-CM

## 2018-08-01 LAB — CMP (CANCER CENTER ONLY)
ALT: 23 U/L (ref 0–44)
AST: 18 U/L (ref 15–41)
Albumin: 3.5 g/dL (ref 3.5–5.0)
Alkaline Phosphatase: 62 U/L (ref 38–126)
Anion gap: 7 (ref 5–15)
BUN: 10 mg/dL (ref 6–20)
CO2: 27 mmol/L (ref 22–32)
Calcium: 8.9 mg/dL (ref 8.9–10.3)
Chloride: 105 mmol/L (ref 98–111)
Creatinine: 0.83 mg/dL (ref 0.44–1.00)
Glucose, Bld: 90 mg/dL (ref 70–99)
POTASSIUM: 4.3 mmol/L (ref 3.5–5.1)
Sodium: 139 mmol/L (ref 135–145)
Total Bilirubin: 0.4 mg/dL (ref 0.3–1.2)
Total Protein: 6.3 g/dL — ABNORMAL LOW (ref 6.5–8.1)

## 2018-08-01 LAB — CBC WITH DIFFERENTIAL (CANCER CENTER ONLY)
Basophils Absolute: 0 10*3/uL (ref 0.0–0.1)
Basophils Relative: 1 %
Eosinophils Absolute: 0.1 10*3/uL (ref 0.0–0.5)
Eosinophils Relative: 2 %
HEMATOCRIT: 39.6 % (ref 34.8–46.6)
HEMOGLOBIN: 13.3 g/dL (ref 11.6–15.9)
LYMPHS ABS: 1.8 10*3/uL (ref 0.9–3.3)
Lymphocytes Relative: 27 %
MCH: 32.2 pg (ref 25.1–34.0)
MCHC: 33.5 g/dL (ref 31.5–36.0)
MCV: 96.1 fL (ref 79.5–101.0)
Monocytes Absolute: 0.7 10*3/uL (ref 0.1–0.9)
Monocytes Relative: 10 %
NEUTROS ABS: 4.2 10*3/uL (ref 1.5–6.5)
NEUTROS PCT: 60 %
Platelet Count: 255 10*3/uL (ref 145–400)
RBC: 4.12 MIL/uL (ref 3.70–5.45)
RDW: 12.3 % (ref 11.2–14.5)
WBC: 6.9 10*3/uL (ref 3.9–10.3)

## 2018-08-01 NOTE — Progress Notes (Signed)
Radiation Oncology         [phone]) [phone] ________________________________  Multidisciplinary Breast Oncology Clinic [facility]) Initial Outpatient Consultation  Name: Dawn Rogers MRN: [id number]  Date: 08/01/2018  DOB: [birth year]  LF:[id number], Hollice Espy, MD  Fanny Skates, MD   REFERRING PHYSICIAN: Fanny Skates, MD  DIAGNOSIS: There were no encounter diagnoses. 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 female who is presenting to the office today for evaluation of her newly diagnosed breast cancer. She is doing well overall.   She had routine screening mammography on 07/12/2018 showing a possible abnormality in the left breast. She underwent bilateral diagnostic mammography with tomography and left breast ultrasonography on 07/19/2018 showing: 0.6 cm irregular mass with associated architectural distortion in the 9:30 position of the left breast 4 cm from the nipple. Findings are suspicious for malignancy. Negative for left axillary lymphadenopathy.   Accordingly on 07/23/2018 she proceeded to biopsy of the left breast area in question. The pathology from this procedure showed: Breast, left, needle core biopsy, 9:30 o'clock, 4 cm fn with invasive ductal carcinoma, grade I. Prognostic indicators significant for: estrogen receptor, 90% positive and progesterone receptor, 90% positive, both with strong staining intensity. Proliferation marker Ki67 at 10%. HER2 negative.  She has a family hx of breast cancer in her sister (diagnosed at ~49 y.o.).    The patient was referred today for presentation in  the multidisciplinary conference.  Radiology studies and pathology slides were presented there for review and discussion of treatment options.  A consensus was discussed regarding potential next steps.  PREVIOUS RADIATION THERAPY: No  PAST MEDICAL HISTORY:  has no past medical history on file.    PAST SURGICAL HISTORY: Past Surgical History:  Procedure Laterality Date  . CHOLECYSTECTOMY    . TUBAL LIGATION      FAMILY HISTORY: family history includes Breast cancer (age of onset: 70) in her sister; Cancer in her paternal aunt and paternal grandfather; Esophageal cancer in her father; Leukemia in her mother; Lung cancer in her paternal aunt and sister; Lymphoma in her daughter; Pancreatic cancer in her paternal aunt.  SOCIAL HISTORY:  reports that she has never smoked. She has never used smokeless tobacco. She reports that she does not drink alcohol or use drugs.  ALLERGIES: Patient has no known allergies.  MEDICATIONS:  Current Outpatient Medications  Medication Sig Dispense Refill  . cyclobenzaprine (FLEXERIL) 5 MG tablet Take 5 mg by mouth 3 (three) times daily as needed for muscle spasms.    . ergocalciferol (VITAMIN D2) 50000 units capsule Take 50,000 Units by mouth once a week.    Marland Kitchen FLUoxetine (PROZAC) 10 MG capsule Take 30 mg by mouth daily.    Marland Kitchen levothyroxine (SYNTHROID, LEVOTHROID) 25 MCG tablet Take 25 mcg by mouth daily before breakfast.    . LORazepam (ATIVAN) 0.5 MG tablet Take 0.5 mg by mouth every 6 (six) hours as needed for anxiety.    Marland Kitchen omeprazole (PRILOSEC) 20 MG capsule Take 20 mg by mouth daily.     No current facility-administered medications for this encounter.     REVIEW OF  SYSTEMS:  A 10+ POINT REVIEW OF SYSTEMS WAS OBTAINED including neurology, dermatology, psychiatry, cardiac, respiratory, lymph, extremities, GI, GU, musculoskeletal, constitutional, reproductive, HEENT. All pertinent positives are noted in the HPI. All others are negative.   PHYSICAL  EXAM: Vitals with BMI 08/01/2018  Height 5' 4"   Weight 158 lbs 13 oz  BMI 78.46  Systolic 962  Diastolic 69  Pulse 58  Respirations 18  Lungs are clear to auscultation bilaterally. Heart has regular rate and rhythm. No palpable cervical, supraclavicular, or axillary adenopathy. Abdomen soft, non-tender, normal bowel sounds. Breast: Right breast with no palpable mass, nipple discharge, or bleeding. Fibrocystic changes noted in the central aspect of the breast. Left breast with biopsy site in the UIQ, no palpable mass, nipple discharge or bleeding. Patient has fibrocystic changes centrally to the breast.    KPS = 100  100 - Normal; no complaints; no evidence of disease. 90   - Able to carry on normal activity; minor signs or symptoms of disease. 80   - Normal activity with effort; some signs or symptoms of disease. 61   - Cares for self; unable to carry on normal activity or to do active work. 60   - Requires occasional assistance, but is able to care for most of his personal needs. 50   - Requires considerable assistance and frequent medical care. 51   - Disabled; requires special care and assistance. 88   - Severely disabled; hospital admission is indicated although death not imminent. 1   - Very sick; hospital admission necessary; active supportive treatment necessary. 10   - Moribund; fatal processes progressing rapidly. 0     - Dead  Karnofsky DA, Abelmann [place], Craver LS and Burchenal [facility] [phone]) The use of the nitrogen mustards in the palliative treatment of carcinoma: with particular reference to bronchogenic carcinoma Cancer 1 634-56  LABORATORY DATA:  Lab Results  Component Value Date   WBC 6.9 08/01/2018   HGB 13.3 08/01/2018   HCT 39.6 08/01/2018   MCV 96.1 08/01/2018   PLT 255 08/01/2018   Lab Results  Component Value Date   NA 139 08/01/2018   K 4.3 08/01/2018   CL 105 08/01/2018   CO2 27 08/01/2018   Lab Results  Component Value Date   ALT 23 08/01/2018   AST  18 08/01/2018   ALKPHOS 62 08/01/2018   BILITOT 0.4 08/01/2018    RADIOGRAPHY: Dg Cervical Spine 2 Or 3 Views  Result Date: 07/13/2018 CLINICAL DATA:  Neck pain with radiation into both hands. No recent injury. EXAM: CERVICAL SPINE - 2-3 VIEW COMPARISON:  MRI 07/04/2011. FINDINGS: Diffuse severe multilevel degenerative change again noted of the cervical spine. Degenerative changes most prominent C5-C6-C6-C7. 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 No acute bony abnormality identified. No evidence of fracture or dislocation. Biapical pleural thickening noted consistent scarring. IMPRESSION: Diffuse severe multilevel degenerative change again noted throughout the cervical spine. Degenerative changes are most prominent at C5-C6 and C6-C7. 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 No acute abnormality identified. Electronically Signed   By: Marcello Moores  Register   On: 07/13/2018 06:44   Mr Cervical Spine Wo Contrast  Result Date: 07/21/2018 CLINICAL DATA:  Cervical disc disease and neck pain EXAM: MRI CERVICAL SPINE WITHOUT CONTRAST TECHNIQUE: Multiplanar, multisequence MR imaging of the cervical spine was performed. No intravenous contrast was administered. COMPARISON:  07/12/2018 radiography and 07/04/2011 MRI FINDINGS: Alignment: Mild C4-5 anterolisthesis, accentuated by ridging Vertebrae: C3-4 incomplete segmentation across  the posterior disc space. No fracture, discitis, or aggressive bone lesion. Mild marrow edema about the left C7-T1 facet. Cord: Negative Posterior Fossa, vertebral arteries, paraspinal tissues: Negative Disc levels: C2-3: Chronic bulky left degenerative facet spurring with moderate left foraminal narrowing. C3-4: Incomplete segmentation. No degenerative changes or impingement C4-5: Asymmetric bulky right degenerative facet spurring. Right uncovertebral ridging. Moderate right foraminal narrowing. Mild spinal stenosis C5-6: Disc narrowing with bulging  and endplate/uncovertebral ridging. Biforaminal impingement. Mild noncompressive spinal stenosis. C6-7: Disc narrowing and bulging with asymmetric right uncovertebral ridging. Negative facets. Right foraminal narrowing that is mild by T2 and more moderate based on sagittal and gradient imaging. C7-T1:Facet arthropathy on the left with marrow edema. Negative disc. No impingement IMPRESSION: 1. Diffuse disc and facet degeneration except at the partially segmented C3-4 level. 2. C2-3 moderate left foraminal narrowing. 3. C4-5 moderate right foraminal narrowing. 4. C5-6 advanced bilateral foraminal narrowing. 5. C6-7 mild to moderate right foraminal narrowing. 6. Mild and noncompressive spinal stenosis at C5-6 and C6-7. Electronically Signed   By: Monte Fantasia M.D.   On: 07/21/2018 10:07   US Breast Ltd Uni Left Inc Axilla  Result Date: 07/19/2018 CLINICAL DATA:  57 year old patient recalled from recent screening mammogram for possible distortion in the left breast. EXAM: DIGITAL DIAGNOSTIC LEFT MAMMOGRAM WITH CAD AND TOMO ULTRASOUND LEFT BREAST COMPARISON:  07/12/2018 ACR Breast Density Category d: The breast tissue is extremely dense, which lowers the sensitivity of mammography. FINDINGS: Spot compression views with tomography of the upper inner quadrant of the left breast confirm an area of architectural distortion with a central mass. There are a few punctate calcifications within the mass. Mammographic images were processed with CAD. On physical exam, no mass is palpated in the upper inner quadrant of the left breast Targeted ultrasound is performed, showing an irregular hypoechoic mass at 9:30 position 4 cm from the nipple with a feeding vessel along the superficial margin. At least one of the punctate calcifications can be seen within the mass on ultrasound. Mass measures 0.6 x 0.5 x 0.5 cm and causes some distortion of the surrounding tissue. Within approximately 8 mm of this solid mass there is a 5 mm  complicated cyst. Ultrasound of the left axilla is negative for lymphadenopathy. IMPRESSION: 0.6 cm irregular mass with associated architectural distortion in the 9:30 position of the left breast 4 cm from the nipple. Findings are suspicious for malignancy. Negative for left axillary lymphadenopathy. RECOMMENDATION: Ultrasound-guided left breast biopsy is recommended and is being scheduled for the patient. Today's findings and the procedure of breast biopsy were discussed with the patient and her husband today. I have discussed the findings and recommendations with the patient. Results were also provided in writing at the conclusion of the visit. If applicable, a reminder letter will be sent to the patient regarding the next appointment. BI-RADS CATEGORY  5: Highly suggestive of malignancy. Electronically Signed   By: Curlene Dolphin M.D.   On: 07/19/2018 12:58   Mm Diag Breast Tomo Uni Left  Result Date: 07/19/2018 CLINICAL DATA:  57 year old patient recalled from recent screening mammogram for possible distortion in the left breast. EXAM: DIGITAL DIAGNOSTIC LEFT MAMMOGRAM WITH CAD AND TOMO ULTRASOUND LEFT BREAST COMPARISON:  07/12/2018 ACR Breast Density Category d: The breast tissue is extremely dense, which lowers the sensitivity of mammography. FINDINGS: Spot compression views with tomography of the upper inner quadrant of the left breast confirm an area of architectural distortion with a central mass. There are a few punctate calcifications  within the mass. Mammographic images were processed with CAD. On physical exam, no mass is palpated in the upper inner quadrant of the left breast Targeted ultrasound is performed, showing an irregular hypoechoic mass at 9:30 position 4 cm from the nipple with a feeding vessel along the superficial margin. At least one of the punctate calcifications can be seen within the mass on ultrasound. Mass measures 0.6 x 0.5 x 0.5 cm and causes some distortion of the surrounding  tissue. Within approximately 8 mm of this solid mass there is a 5 mm complicated cyst. Ultrasound of the left axilla is negative for lymphadenopathy. IMPRESSION: 0.6 cm irregular mass with associated architectural distortion in the 9:30 position of the left breast 4 cm from the nipple. Findings are suspicious for malignancy. Negative for left axillary lymphadenopathy. RECOMMENDATION: Ultrasound-guided left breast biopsy is recommended and is being scheduled for the patient. Today's findings and the procedure of breast biopsy were discussed with the patient and her husband today. I have discussed the findings and recommendations with the patient. Results were also provided in writing at the conclusion of the visit. If applicable, a reminder letter will be sent to the patient regarding the next appointment. BI-RADS CATEGORY  5: Highly suggestive of malignancy. Electronically Signed   By: Curlene Dolphin M.D.   On: 07/19/2018 12:58   Mm 3d Screen Breast Bilateral  Result Date: 07/13/2018 CLINICAL DATA:  Screening. EXAM: DIGITAL SCREENING BILATERAL MAMMOGRAM WITH TOMO AND CAD COMPARISON:  Previous exam(s). ACR Breast Density Category d: The breast tissue is extremely dense, which lowers the sensitivity of mammography. FINDINGS: In the left breast, possible distortion warrants further evaluation. In the right breast, no findings suspicious for malignancy. Images were processed with CAD. IMPRESSION: Further evaluation is suggested for possible distortion in the left breast. RECOMMENDATION: Diagnostic mammogram and possibly ultrasound of the left breast. (Code:[id number]-87M) The patient will be contacted regarding the findings, and additional imaging will be scheduled. BI-RADS CATEGORY  0: Incomplete. Need additional imaging evaluation and/or prior mammograms for comparison. Electronically Signed   By: Lovey Newcomer M.D.   On: 07/13/2018 13:17   Mm Clip Placement Left  Result Date: 07/23/2018 CLINICAL DATA:   Ultrasound-guided core needle biopsy was performed of a 5-6 mm mass in the upper inner quadrant of the left breast. EXAM: DIAGNOSTIC LEFT MAMMOGRAM POST ULTRASOUND BIOPSY COMPARISON:  Previous exam(s). FINDINGS: Mammographic images were obtained following ultrasound guided biopsy of left breast mass. The ribbon shaped biopsy clip is satisfactorily positioned within the mass/distortion, and confirmed on tomographic images in the MLO projection. 2D images were performed in the CC and 90 degree lateral projections, and the clip is in good position. IMPRESSION: Satisfactory position of ribbon shaped biopsy clip in the left breast. Final Assessment: Post Procedure Mammograms for Marker Placement Electronically Signed   By: Curlene Dolphin M.D.   On: 07/23/2018 14:55   [place] Lt Breast Bx W Loc Dev 1st Lesion Img Bx Spec US Guide  Addendum Date: 07/30/2018   ADDENDUM REPORT: 07/25/2018 08:00 ADDENDUM: Pathology revealed GRADE I INVASIVE DUCTAL CARCINOMA, of the LEFT breast, 9:30 o'clock, 4 cm fn. This was found to be concordant by Dr. Curlene Dolphin. Pathology results were discussed with the patient by telephone. The patient reported doing well after the biopsy with tenderness at the site. Post biopsy instructions and care were reviewed and questions were answered. The patient was encouraged to call [facility] for any additional concerns. The patient was referred to  [facility] on August 01, 2018. A bilateral breast MRI is recommended due to breast density and family history. Pathology results reported by Roselind Messier, RN on 07/25/2018. Electronically Signed   By: Curlene Dolphin M.D.   On: 07/25/2018 08:00   Result Date: 07/30/2018 CLINICAL DATA:  57 year old patient presents for biopsy of a an approximately 5-6 mm hypoechoic mass in the 9:30 position of the left breast 4 cm from the nipple. EXAM: ULTRASOUND GUIDED LEFT  BREAST CORE NEEDLE BIOPSY COMPARISON:  Previous exam(s). FINDINGS: I met with the patient and we discussed the procedure of ultrasound-guided biopsy, including benefits and alternatives. We discussed the high likelihood of a successful procedure. We discussed the risks of the procedure, including infection, bleeding, tissue injury, clip migration, and inadequate sampling. Informed written consent was given. The usual time-out protocol was performed immediately prior to the procedure. Lesion quadrant: Upper inner quadrant Using sterile technique and 1% Lidocaine as local anesthetic, under direct ultrasound visualization, a 12 gauge spring-loaded device was used to perform biopsy of a hypoechoic mass using a medial approach. At the conclusion of the procedure a tissue marker clip was deployed into the biopsy cavity. Follow up 2 view mammogram was performed and dictated separately. IMPRESSION: Ultrasound guided biopsy of the left breast. No apparent complications. Electronically Signed: By: Curlene Dolphin M.D. 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   Today, I talked to the patient and family about the findings and work-up thus far.  We discussed the natural history of breast cancer and general treatment, highlighting the role of radiotherapy in the management.  We discussed the available radiation techniques, and focused on the details of logistics and delivery.  We reviewed the anticipated acute and late sequelae associated with radiation in this setting.  The patient was encouraged to ask questions that I answered to the best of my ability.    PLAN:  1. Left Lumpectomy with sentinel node procedure 2. MRI of the breasts given the breast density    3. Adjuvant Radiation Therapy 4. Aromatase Inhibitor 5.  Genetics   ------------------------------------------------  Blair Promise, PhD, MD   This document serves as a record of services personally performed by Gery Pray, MD. It was created on his behalf by Wilburn Mylar, a trained medical scribe. The creation of this record is based on the scribe's personal observations and the provider's statements to them. This document has been checked and approved by the attending provider.

## 2018-08-01 NOTE — Progress Notes (Signed)
[place] Psychosocial Distress Screening Spiritual Care  Met with Hester in [facility] to introduce [facility] team/resources, reviewing distress screen per protocol.  The patient scored a 2 on the Psychosocial Distress Thermometer which indicates mild distress. Also assessed for distress and other psychosocial needs.   ONCBCN DISTRESS SCREENING 08/01/2018  Screening Type Initial Screening  Distress experienced in past week (1-10) 2  Emotional problem type Adjusting to illness  Information Concerns Type Lack of info about treatment  Physical Problem type Bathing/dressing;Tingling hands/feet;Skin dry/itchy  Referral to support programs Yes    The patient brought her husband and two daughters to [facility] for support. The patient reported having "peace" and feeling supported by her family and church community. The patient expressed that her distress is now at a 0, since she has received information about her treatment. The patient is not currently interested in utilizing any Patient and Family Support resources, but she is aware of them if a need arises.  Follow up needed: No.  Doris Cheadle, Counseling Intern [phone]

## 2018-08-02 DIAGNOSIS — C801 Malignant (primary) neoplasm, unspecified: Secondary | ICD-10-CM

## 2018-08-02 HISTORY — DX: Malignant (primary) neoplasm, unspecified: C80.1

## 2018-08-03 ENCOUNTER — Telehealth: Payer: Self-pay | Admitting: *Deleted

## 2018-08-03 NOTE — Telephone Encounter (Signed)
Spoke with pt concerning [place] from 9.25.19. Denies questions or concerns regarding dx or treatment care plan. Discussed oncotype testing and recommendations per Dr. Burr Medico. Encourage pt to call with needs. Received verbal understanding.

## 2018-08-06 ENCOUNTER — Inpatient Hospital Stay: Payer: BC Managed Care – PPO

## 2018-08-06 ENCOUNTER — Ambulatory Visit (HOSPITAL_COMMUNITY)
Admission: RE | Admit: 2018-08-06 | Discharge: 2018-08-06 | Disposition: A | Discharge status: Home / Self Care | Payer: BC Managed Care – PPO | Source: Ambulatory Visit | Attending: General Surgery | Admitting: General Surgery

## 2018-08-06 ENCOUNTER — Inpatient Hospital Stay (HOSPITAL_BASED_OUTPATIENT_CLINIC_OR_DEPARTMENT_OTHER): Payer: BC Managed Care – PPO | Admitting: Genetics

## 2018-08-06 DIAGNOSIS — Z803 Family history of malignant neoplasm of breast: Secondary | ICD-10-CM

## 2018-08-06 DIAGNOSIS — Z800 Family history of malignant neoplasm of digestive organs: Secondary | ICD-10-CM

## 2018-08-06 DIAGNOSIS — Z7183 Encounter for nonprocreative genetic counseling: Secondary | ICD-10-CM

## 2018-08-06 DIAGNOSIS — C50212 Malignant neoplasm of upper-inner quadrant of left female breast: Secondary | ICD-10-CM | POA: Diagnosis not present

## 2018-08-06 DIAGNOSIS — Z8041 Family history of malignant neoplasm of ovary: Secondary | ICD-10-CM

## 2018-08-06 DIAGNOSIS — Z807 Family history of other malignant neoplasms of lymphoid, hematopoietic and related tissues: Secondary | ICD-10-CM

## 2018-08-06 DIAGNOSIS — N6342 Unspecified lump in left breast, subareolar: Secondary | ICD-10-CM | POA: Insufficient documentation

## 2018-08-06 DIAGNOSIS — Z170 Estrogen receptor positive status [ER+]: Secondary | ICD-10-CM | POA: Insufficient documentation

## 2018-08-06 MED ORDER — GADOBUTROL 1 MMOL/ML IV SOLN
7.0000 mL | Freq: Once | INTRAVENOUS | Status: AC | PRN
  Administered 2018-08-06: 7 mL via INTRAVENOUS

## 2018-08-07 ENCOUNTER — Encounter: Payer: Self-pay | Admitting: Genetics

## 2018-08-07 ENCOUNTER — Other Ambulatory Visit: Payer: Self-pay | Admitting: General Surgery

## 2018-08-07 DIAGNOSIS — Z8041 Family history of malignant neoplasm of ovary: Principal | ICD-10-CM

## 2018-08-07 DIAGNOSIS — Z807 Family history of other malignant neoplasms of lymphoid, hematopoietic and related tissues: Secondary | ICD-10-CM | POA: Insufficient documentation

## 2018-08-07 DIAGNOSIS — Z803 Family history of malignant neoplasm of breast: Secondary | ICD-10-CM

## 2018-08-07 DIAGNOSIS — Z800 Family history of malignant neoplasm of digestive organs: Secondary | ICD-10-CM

## 2018-08-07 HISTORY — PX: BREAST LUMPECTOMY: SHX2

## 2018-08-07 NOTE — Progress Notes (Signed)
REFERRING PROVIDER: Truitt Merle, [place] [place], [place] [address]  PRIMARY PROVIDER:  Dione Housekeeper, MD  PRIMARY REASON FOR VISIT:  1. Family history of ovarian cancer   2. Family history of lymphoma   3. Family history of breast cancer   4. Family history of esophageal cancer     HISTORY OF PRESENT ILLNESS:   Dawn Rogers, a 57 y.o. female, was seen for a [facility] cancer genetics consultation at the request of Dr. Burr Medico due to a personal and family history of cancer.  Dawn Rogers presents to clinic today to discuss the possibility of a hereditary predisposition to cancer, genetic testing, and to further clarify her future cancer risks, as well as potential cancer risks for family members.   In Sept 2019, at the age of 85, Dawn Rogers was diagnosed with Invasive ductal carcinoma, ER/PR+, HER2- of the left breast. She is undergoing MRI later today and currently plans to have a lumpectomy followed by adjuvant radiation and antiestrogen therapy.  However, she states the results of genetic testing may impact her surgical decision.   CANCER HISTORY:  Oncology History   Cancer Staging Malignant neoplasm of upper-inner quadrant of left breast in female, estrogen receptor positive ([place]) Staging form: Breast, AJCC 8th Edition - Clinical stage from 07/23/2018: Stage IA (cT1b, cN0, cM0, G1, ER+, PR+, HER2-) - Signed by Truitt Merle, MD on 08/01/2018       Malignant neoplasm of upper-inner quadrant of left breast in female, estrogen receptor positive ([place])   07/13/2018 Mammogram    07/13/2018 Screening Mammogram IMPRESSION: Further evaluation is suggested for possible distortion in the left breast    07/19/2018 Breast US     07/19/2018 Breast US IMPRESSION: 0.6 cm irregular mass with associated architectural distortion in the 9:30 position of the left breast 4 cm from the nipple. Findings are suspicious for malignancy.  Negative for left axillary lymphadenopathy    07/19/2018  Mammogram    07/19/2018 Diagnostic Mammogram IMPRESSION: 0.6 cm irregular mass with associated architectural distortion in the 9:30 position of the left breast 4 cm from the nipple. Findings are suspicious for malignancy.  Negative for left axillary lymphadenopathy.    07/23/2018 Cancer Staging    Staging form: Breast, AJCC 8th Edition - Clinical stage from 07/23/2018: Stage IA (cT1b, cN0, cM0, G1, ER+, PR+, HER2-) - Signed by Truitt Merle, MD on 08/01/2018    07/23/2018 Receptors her2    ADDITIONAL INFORMATION: PROGNOSTIC INDICATORS Results: IMMUNOHISTOCHEMICAL AND MORPHOMETRIC ANALYSIS PERFORMED MANUALLY The tumor cells are Negative for Her2 (1+). Estrogen Receptor: 90%, POSITIVE, STRONG STAINING INTENSITY Progesterone Receptor: 90%, POSITIVE, STRONG STAINING INTENSITY Proliferation Marker Ki67: 10%    07/23/2018 Pathology Results    Diagnosis Breast, left, needle core biopsy, 9:30 o'clock, 4 cm fn - INVASIVE DUCTAL CARCINOMA, GRADE I. SEE NOTE.    07/27/2018 Initial Diagnosis    Malignant neoplasm of upper-inner quadrant of left breast in female, estrogen receptor positive ([place])      HORMONAL RISK FACTORS:  Menarche was at age 27.  First live birth at age 75.  Ovaries intact: yes.  Hysterectomy: no.  Menopausal status: perimenopausal.  Colonoscopy: yes; at 30, reportedly normal, told next one in 10 years.  Past Medical History:  Diagnosis Date  . Family history of breast cancer   . Family history of esophageal cancer   . Family history of lymphoma   . Family history of ovarian cancer     Past Surgical History:  Procedure Laterality  Date  . CHOLECYSTECTOMY    . TUBAL LIGATION      Social History   Socioeconomic History  . Marital status: Married    Spouse name: Not on file  . Number of children: Not on file  . Years of education: Not on file  . Highest education level: Not on file  Occupational History  . Not on file  Social Needs  . Financial resource  strain: Not on file  . Food insecurity:    Worry: Not on file    Inability: Not on file  . Transportation needs:    Medical: Not on file    Non-medical: Not on file  Tobacco Use  . Smoking status: Never Smoker  . Smokeless tobacco: Never Used  Substance and Sexual Activity  . Alcohol use: Never    Frequency: Never  . Drug use: Never  . Sexual activity: Not on file  Lifestyle  . Physical activity:    Days per week: Not on file    Minutes per session: Not on file  . Stress: Not on file  Relationships  . Social connections:    Talks on phone: Not on file    Gets together: Not on file    Attends religious service: Not on file    Active member of club or organization: Not on file    Attends meetings of clubs or organizations: Not on file    Relationship status: Not on file  Other Topics Concern  . Not on file  Social History Narrative  . Not on file     FAMILY HISTORY:  We obtained a detailed, 4-generation family history.  Significant diagnoses are listed below: Family History  Problem Relation Age of Onset  . Breast cancer Sister 43  . Lung cancer Sister   . Leukemia Mother        'on the verge of leukemia'  . Melanoma Mother   . Cancer Paternal Grandfather        unsure kind of cancer- mass on neck  . Lymphoma Daughter   . Cancer Paternal Aunt        unsure kind of cancer   . Lung cancer Paternal Aunt   . Esophageal cancer Father 53  . Ovarian cancer Paternal Aunt 32       also cancer in colon and liver-unk if primary or met    Dawn Rogers has 2 daughters ages 27 and 32.  Her oldest daughter has a history of lymphoma.  She has 1 grandson and 1 granddaughter.  Dawn Rogers has 2 full brothers (ages 20 and 52), and a paternal half brother who is 69- no history of cancer in these relatives.  Dawn Rogers has a sister who was dx with breast cancer at 35.  She also had lung cancer and died at 21.   Dawn Rogers father: died of esophageal cancer at 57.  Paternal aunts/Uncles:  9-10 paternal aunts/uncles.  1 paternal aunt was dx with ovarian cancer at 60.  She also had colon and liver cancer- unk if primaries or mets. 1 other paternal aunt died of cancer, but the type of cancer is unknown.  Paternal cousins: no known history of cancer. Limited info  Paternal grandfather: died of cancer- type unk but she remembers a mass on his neck Paternal grandmother:died on old age, no hx of cancer  Ms. Pilat's mother: died at 46.  She had a hx of melanoma- she had several mohs procedures.  She also was "on  the verge of leukemia".   Maternal Aunts/Uncles: 9-10 maternal aunts/uncles, no cancer the patient is aware of.  Maternal cousins: 1 cousin with cancer- type unk.  No other known hx of cancer Maternal grandfather: died of age related disease, no cancer Maternal grandmother:died of age related disease, no hx of cancer  Ms. Mcneary is unaware of previous family history of genetic testing for hereditary cancer risks. Patient's maternal ancestors are of N. European descent, and paternal ancestors are of N. European descent. There is no reported Ashkenazi Jewish ancestry. There is no known consanguinity.  GENETIC COUNSELING ASSESSMENT: TRISTYN PHARRIS is a 57 y.o. female with a personal and family history which is somewhat suggestive of a Hereditary Cancer Predisposition Syndrome. We, therefore, discussed and recommended the following at today's visit.   DISCUSSION: We reviewed the characteristics, features and inheritance patterns of hereditary cancer syndromes. We also discussed genetic testing, including the appropriate family members to test, the process of testing, insurance coverage and turn-around-time for results. We discussed the implications of a negative, positive and/or variant of uncertain significant result. In order to get genetic test results in a timely manner so that Ms. Bundrick can use these genetic test results for surgical decisions, we recommended Ms. Lines pursue genetic  testing for the Breast Cancer STAT panel. The STAT Breast cancer panel offered by [facility] includes sequencing and rearrangement analysis for the following 9 genes:  ATM, BRCA1, BRCA2, CDH1, CHEK2, PALB2, PTEN, STK11 and TP53.    We then recommend Ms. Chivers pursue reflex genetic testing to the Multi-Cancer gene panel.   The Multi-Cancer Panel offered by [facility] includes sequencing and/or deletion duplication testing of the following 91 genes: AIP, ALK, APC, ATM, AXIN2, BAP1, BARD1, BLM, BMPR1A, BRCA1, BRCA2, BRIP1, BUB1B, CASR, CDC73, CDH1, CDK4, CDKN1B, CDKN1C, CDKN2A, CEBPA, CEP57, CHEK2, CTNNA1, DICER1, DIS3L2, EGFR, ENG, EPCAM, FH, FLCN, GALNT12, GATA2, GPC3, GREM1, HOXB13, HRAS, KIT, MAX, MEN1, MET, MITF, MLH1, MLH3, MSH2, MSH3, MSH6, MUTYH, NBN, NF1, NF2, NTHL1, PALB2, PDGFRA, PHOX2B, PMS2, POLD1, POLE, POT1, PRKAR1A, PTCH1, PTEN, RAD50, RAD51C, RAD51D, RB1, RECQL4, RET, RNF43, RPS20, RUNX1, SDHA, SDHAF2, SDHB, SDHC, SDHD, SMAD4, SMARCA4, SMARCB1, SMARCE1, STK11, SUFU, TERC, TERT, TMEM127, TP53, TSC1, TSC2, VHL, WRN, WT1  We discussed that only 5-10% of cancers are associated with a Hereditary cancer predisposition syndrome.  One of the most common hereditary cancer syndromes that increases breast cancer risk is called Hereditary Breast and Ovarian Cancer (HBOC) syndrome.  This syndrome is caused by mutations in the BRCA1 and BRCA2 genes.  This syndrome increases an individual's lifetime risk to develop breast, ovarian, pancreatic, and other types of cancer.  There are also many other cancer predisposition syndromes caused by mutations in several other genes.  We discussed that if she is found to have a mutation in one of these genes, it may impact surgical decisions, and alter future medical management recommendations such as increased cancer screenings and consideration of risk reducing surgeries.  A positive result could also have implications for the patient's family members.  A Negative result  would mean we were unable to identify a hereditary component to her cancer, but does not rule out the possibility of a hereditary basis for her cancer.  There could be mutations that are undetectable by current technology, or in genes not yet tested or identified to increase cancer risk.    We discussed the potential to find a Variant of Uncertain Significance or VUS.  These are variants that have not yet been identified as  pathogenic or benign, and it is unknown if this variant is associated with increased cancer risk or if this is a normal finding.  Most VUS's are reclassified to benign or likely benign.   It should not be used to make medical management decisions. With time, we suspect the lab will determine the significance of any VUS's identified if any.   Based on Ms. Wix's personal and family history of cancer, she meets medical criteria for genetic testing. Despite that she meets criteria, she may still have an out of pocket cost. The laboratory can provide her with an estimate of her OOP cost. she was given the contact information of the laboratory if she has further questions.   PLAN: After considering the risks, benefits, and limitations, Ms. Brazell  provided informed consent to pursue genetic testing and the blood sample was sent to [facility] for analysis of the Breast Cancer STAT panel with plans to reflex to the Multi-cancer panel. Preliminary results should be available within approximately 5-10 days' time, at which point they will be disclosed by telephone to Ms. Kalmbach, as will any additional recommendations warranted by these results. Ms. Rinella will receive a summary of her genetic counseling visit and a copy of her results once available. This information will also be available in Epic. We encouraged Ms. Eichelberger to remain in contact with cancer genetics annually so that we can continuously update the family history and inform her of any changes in cancer genetics and testing that  may be of benefit for her family. Ms. Cancelliere questions were answered to her satisfaction today. Our contact information was provided should additional questions or concerns arise.  Based on Ms. Vitug's family history, we recommended her paternal relatives also, have genetic counseling and testing. Ms. Narang will let us know if we can be of any assistance in coordinating genetic counseling and/or testing for this family member.   Lastly, we encouraged Ms. Mcdougald to remain in contact with cancer genetics annually so that we can continuously update the family history and inform her of any changes in cancer genetics and testing that may be of benefit for this family.   Ms.  Atchley questions were answered to her satisfaction today. Our contact information was provided should additional questions or concerns arise. Thank you for the referral and allowing [place] to share in the care of your patient.   Tana Felts, MS, [facility] Certified Genetic Counselor [email or web address] phone: [phone]  The patient was seen for a total of 40 minutes in face-to-face genetic counseling.  The patient was accompanied today by her daughter and grandson. This patient was discussed with Drs. Magrinat, Lindi Adie and/or Burr Medico who agrees with the above.

## 2018-08-08 ENCOUNTER — Other Ambulatory Visit: Payer: Self-pay | Admitting: General Surgery

## 2018-08-08 DIAGNOSIS — C50212 Malignant neoplasm of upper-inner quadrant of left female breast: Secondary | ICD-10-CM

## 2018-08-14 ENCOUNTER — Ambulatory Visit
Admission: RE | Admit: 2018-08-14 | Discharge: 2018-08-14 | Disposition: A | Discharge status: Home / Self Care | Payer: BC Managed Care – PPO | Source: Ambulatory Visit | Attending: General Surgery | Admitting: General Surgery

## 2018-08-14 DIAGNOSIS — C50212 Malignant neoplasm of upper-inner quadrant of left female breast: Secondary | ICD-10-CM

## 2018-08-15 ENCOUNTER — Other Ambulatory Visit: Payer: Self-pay | Admitting: General Surgery

## 2018-08-15 DIAGNOSIS — R9389 Abnormal findings on diagnostic imaging of other specified body structures: Secondary | ICD-10-CM

## 2018-08-16 ENCOUNTER — Telehealth: Payer: Self-pay | Admitting: Genetics

## 2018-08-16 NOTE — Telephone Encounter (Signed)
Revealed negative genetic testing.   This normal result is reassuring and indicates that it is unlikely Dawn Rogers's cancer is due to a hereditary cause.  It is unlikely that there is an increased risk of another cancer due to a mutation in one of these genes.  However, genetic testing is not perfect, and cannot definitively rule out a hereditary cause.  It will be important for her to keep in contact with genetics to learn if any additional testing may be needed in the future.     Recommended she and her family continue to follow all of their healthcare providers' recommendations regarding cancer management and screening.

## 2018-08-17 ENCOUNTER — Encounter: Payer: Self-pay | Admitting: Genetics

## 2018-08-17 ENCOUNTER — Ambulatory Visit: Payer: Self-pay | Admitting: Genetics

## 2018-08-17 DIAGNOSIS — Z803 Family history of malignant neoplasm of breast: Secondary | ICD-10-CM

## 2018-08-17 DIAGNOSIS — Z170 Estrogen receptor positive status [ER+]: Secondary | ICD-10-CM

## 2018-08-17 DIAGNOSIS — Z807 Family history of other malignant neoplasms of lymphoid, hematopoietic and related tissues: Secondary | ICD-10-CM

## 2018-08-17 DIAGNOSIS — Z1379 Encounter for other screening for genetic and chromosomal anomalies: Secondary | ICD-10-CM

## 2018-08-17 DIAGNOSIS — Z8041 Family history of malignant neoplasm of ovary: Secondary | ICD-10-CM

## 2018-08-17 DIAGNOSIS — C50212 Malignant neoplasm of upper-inner quadrant of left female breast: Secondary | ICD-10-CM

## 2018-08-17 DIAGNOSIS — Z800 Family history of malignant neoplasm of digestive organs: Secondary | ICD-10-CM

## 2018-08-17 NOTE — Progress Notes (Signed)
HPI:  Dawn Rogers was previously seen in the [place] clinic on 08/06/2018 due to a personal and family history of cancer and concerns regarding a hereditary predisposition to cancer. Please refer to our prior cancer genetics clinic note for more information regarding Dawn Rogers's medical, social and family histories, and our assessment and recommendations, at the time. Dawn Rogers recent genetic test results were disclosed to her, as well as recommendations warranted by these results. These results and recommendations are discussed in more detail below.  CANCER HISTORY:  Oncology History   Cancer Staging Malignant neoplasm of upper-inner quadrant of left breast in female, estrogen receptor positive ([place]) Staging form: Breast, AJCC 8th Edition - Clinical stage from 07/23/2018: Stage IA (cT1b, cN0, cM0, G1, ER+, PR+, HER2-) - Signed by Truitt Merle, MD on 08/01/2018       Malignant neoplasm of upper-inner quadrant of left breast in female, estrogen receptor positive ([place])   07/13/2018 Mammogram    07/13/2018 Screening Mammogram IMPRESSION: Further evaluation is suggested for possible distortion in the left breast    07/19/2018 Breast US     07/19/2018 Breast US IMPRESSION: 0.6 cm irregular mass with associated architectural distortion in the 9:30 position of the left breast 4 cm from the nipple. Findings are suspicious for malignancy.  Negative for left axillary lymphadenopathy    07/19/2018 Mammogram    07/19/2018 Diagnostic Mammogram IMPRESSION: 0.6 cm irregular mass with associated architectural distortion in the 9:30 position of the left breast 4 cm from the nipple. Findings are suspicious for malignancy.  Negative for left axillary lymphadenopathy.    07/23/2018 Cancer Staging    Staging form: Breast, AJCC 8th Edition - Clinical stage from 07/23/2018: Stage IA (cT1b, cN0, cM0, G1, ER+, PR+, HER2-) - Signed by Truitt Merle, MD on 08/01/2018    07/23/2018 Receptors her2   ADDITIONAL INFORMATION: PROGNOSTIC INDICATORS Results: IMMUNOHISTOCHEMICAL AND MORPHOMETRIC ANALYSIS PERFORMED MANUALLY The tumor cells are Negative for Her2 (1+). Estrogen Receptor: 90%, POSITIVE, STRONG STAINING INTENSITY Progesterone Receptor: 90%, POSITIVE, STRONG STAINING INTENSITY Proliferation Marker Ki67: 10%    07/23/2018 Pathology Results    Diagnosis Breast, left, needle core biopsy, 9:30 o'clock, 4 cm fn - INVASIVE DUCTAL CARCINOMA, GRADE I. SEE NOTE.    07/27/2018 Initial Diagnosis    Malignant neoplasm of upper-inner quadrant of left breast in female, estrogen receptor positive ([place])    08/16/2018 Genetic Testing    The Multi-Cancer Panel offered by [facility] includes sequencing and/or deletion duplication testing of the following 91 genes: AIP, ALK, APC, ATM, AXIN2, BAP1, BARD1, BLM, BMPR1A, BRCA1, BRCA2, BRIP1, BUB1B, CASR, CDC73, CDH1, CDK4, CDKN1B, CDKN1C, CDKN2A, CEBPA, CEP57, CHEK2, CTNNA1, DICER1, DIS3L2, EGFR, ENG, EPCAM, FH, FLCN, GALNT12, GATA2, GPC3, GREM1, HOXB13, HRAS, KIT, MAX, MEN1, MET, MITF, MLH1, MLH3, MSH2, MSH3, MSH6, MUTYH, NBN, NF1, NF2, NTHL1, PALB2, PDGFRA, PHOX2B, PMS2, POLD1, POLE, POT1, PRKAR1A, PTCH1, PTEN, RAD50, RAD51C, RAD51D, RB1, RECQL4, RET, RNF43, RPS20, RUNX1, SDHA, SDHAF2, SDHB, SDHC, SDHD, SMAD4, SMARCA4, SMARCB1, SMARCE1, STK11, SUFU, TERC, TERT, TMEM127, TP53, TSC1, TSC2, VHL, WRN, WT1  Results: Negative, no pathogenic variants identified.  The date of this test report is 08/16/2018.       FAMILY HISTORY:  We obtained a detailed, 4-generation family history.  Significant diagnoses are listed below: Family History  Problem Relation Age of Onset  . Breast cancer Sister 58  . Lung cancer Sister   . Leukemia Mother        'on the verge of leukemia'  . Melanoma  Mother   . Cancer Paternal Grandfather        unsure kind of cancer- mass on neck  . Lymphoma Daughter   . Cancer Paternal Aunt        unsure kind of cancer   . Lung cancer  Paternal Aunt   . Esophageal cancer Father 28  . Ovarian cancer Paternal Aunt 39       also cancer in colon and liver-unk if primary or met    Dawn Rogers has 2 daughters ages 44 and 70.  Her oldest daughter has a history of lymphoma.  She has 1 grandson and 1 granddaughter.  Dawn Rogers has 2 full brothers (ages 46 and 43), and a paternal half brother who is 40- no history of cancer in these relatives.  Dawn Rogers has a sister who was dx with breast cancer at 31.  She also had lung cancer and died at 87.   Dawn Rogers father: died of esophageal cancer at 66.  Paternal aunts/Uncles: 9-10 paternal aunts/uncles.  1 paternal aunt was dx with ovarian cancer at 36.  She also had colon and liver cancer- unk if primaries or mets. 1 other paternal aunt died of cancer, but the type of cancer is unknown.  Paternal cousins: no known history of cancer. Limited info  Paternal grandfather: died of cancer- type unk but she remembers a mass on his neck Paternal grandmother:died on old age, no hx of cancer  Dawn Rogers's mother: died at 48.  She had a hx of melanoma- she had several mohs procedures.  She also was "on the verge of leukemia".   Maternal Aunts/Uncles: 9-10 maternal aunts/uncles, no cancer the patient is aware of.  Maternal cousins: 1 cousin with cancer- type unk.  No other known hx of cancer Maternal grandfather: died of age related disease, no cancer Maternal grandmother:died of age related disease, no hx of cancer  Dawn Rogers is unaware of previous family history of genetic testing for hereditary cancer risks. Patient's maternal ancestors are of N. European descent, and paternal ancestors are of N. European descent. There is no reported Ashkenazi Jewish ancestry. There is no known consanguinity.  GENETIC TEST RESULTS: Genetic testing performed through [facility]'s Multi-Cancer Panel reported out on 08/16/2018 showed no pathogenic mutations. The Multi-Cancer Panel offered by [facility] includes sequencing  and/or deletion duplication testing of the following 91 genes: AIP, ALK, APC, ATM, AXIN2, BAP1, BARD1, BLM, BMPR1A, BRCA1, BRCA2, BRIP1, BUB1B, CASR, CDC73, CDH1, CDK4, CDKN1B, CDKN1C, CDKN2A, CEBPA, CEP57, CHEK2, CTNNA1, DICER1, DIS3L2, EGFR, ENG, EPCAM, FH, FLCN, GALNT12, GATA2, GPC3, GREM1, HOXB13, HRAS, KIT, MAX, MEN1, MET, MITF, MLH1, MLH3, MSH2, MSH3, MSH6, MUTYH, NBN, NF1, NF2, NTHL1, PALB2, PDGFRA, PHOX2B, PMS2, POLD1, POLE, POT1, PRKAR1A, PTCH1, PTEN, RAD50, RAD51C, RAD51D, RB1, RECQL4, RET, RNF43, RPS20, RUNX1, SDHA, SDHAF2, SDHB, SDHC, SDHD, SMAD4, SMARCA4, SMARCB1, SMARCE1, STK11, SUFU, TERC, TERT, TMEM127, TP53, TSC1, TSC2, VHL, WRN, WT1  The test report will be scanned into EPIC and will be located under the Molecular Pathology section of the Results Review tab. A portion of the result report is included below for reference.     We discussed with Dawn Rogers that because current genetic testing is not perfect, it is possible there may be a gene mutation in one of these genes that current testing cannot detect, but that chance is small.  We also discussed, that there could be another gene that has not yet been discovered, or that we have not yet tested, that is responsible for  the cancer diagnoses in the family. It is also possible there is a hereditary cause for the cancer in the family that Dawn Rogers did not inherit and therefore was not identified in her testing.  Therefore, it is important to remain in touch with cancer genetics in the future so that we can continue to offer Dawn Rogers the most up to date genetic testing.   ADDITIONAL GENETIC TESTING: We discussed with Dawn Rogers that her genetic testing was fairly extensive.  If there are are genes identified to increase cancer risk that can be analyzed in the future, we would be happy to discuss and coordinate this testing at that time.    CANCER SCREENING RECOMMENDATIONS: Dawn Rogers test result is considered negative (normal).  This means  that we have not identified a hereditary cause for her personal and family history of cancer at this time.   While reassuring, this does not definitively rule out a hereditary predisposition to cancer. It is still possible that there could be genetic mutations that are undetectable by current technology, or genetic mutations in genes that have not been tested or identified to increase cancer risk.  Therefore, it is recommended she continue to follow the cancer management and screening guidelines provided by her oncology and primary healthcare provider. An individual's cancer risk is not determined by genetic test results alone.  Overall cancer risk assessment includes additional factors such as personal medical history, family history, etc.  These should be used to make a personalized plan for cancer prevention and surveillance.    RECOMMENDATIONS FOR FAMILY MEMBERS:  Relatives in this family might be at some increased risk of developing cancer, over the general population risk, simply due to the family history of cancer.  We recommended women in this family have a yearly mammogram beginning at age 10, or 53 years younger than the earliest onset of cancer, an annual clinical breast exam, and perform monthly breast self-exams. Women in this family should also have a gynecological exam as recommended by their primary provider. All family members should have a colonoscopy by age 49 (or as directed by their doctors).  All family members should inform their physicians about the family history of cancer so their doctors can make the most appropriate screening recommendations for them.   It is also possible there is a hereditary cause for the cancer in Dawn Rogers's family that she did not inherit and therefore was not identified in her.   Therefore, we recommended siblings and maternal relatives also have genetic counseling and testing. Dawn Rogers will let us know if we can be of any assistance in coordinating genetic  counseling and/or testing for these family members.   FOLLOW-UP: Lastly, we discussed with Dawn Rogers that cancer genetics is a rapidly advancing field and it is possible that new genetic tests will be appropriate for her and/or her family members in the future. We encouraged her to remain in contact with cancer genetics on an annual basis so we can update her personal and family histories and let her know of advances in cancer genetics that may benefit this family.   Our contact number was provided. Dawn Rogers questions were answered to her satisfaction, and she knows she is welcome to call us at anytime with additional questions or concerns.   Ferol Luz, MS, [facility] Certified Genetic Counselor [email or web address]

## 2018-08-20 ENCOUNTER — Ambulatory Visit
Admission: RE | Admit: 2018-08-20 | Discharge: 2018-08-20 | Disposition: A | Discharge status: Home / Self Care | Payer: BC Managed Care – PPO | Source: Ambulatory Visit | Attending: General Surgery | Admitting: General Surgery

## 2018-08-20 DIAGNOSIS — R9389 Abnormal findings on diagnostic imaging of other specified body structures: Secondary | ICD-10-CM

## 2018-08-20 MED ORDER — GADOBENATE DIMEGLUMINE 529 MG/ML IV SOLN
15.0000 mL | Freq: Once | INTRAVENOUS | Status: AC | PRN
  Administered 2018-08-20: 15 mL via INTRAVENOUS

## 2018-08-22 ENCOUNTER — Other Ambulatory Visit: Payer: Self-pay | Admitting: General Surgery

## 2018-08-22 DIAGNOSIS — Z170 Estrogen receptor positive status [ER+]: Principal | ICD-10-CM

## 2018-08-22 DIAGNOSIS — C50212 Malignant neoplasm of upper-inner quadrant of left female breast: Secondary | ICD-10-CM

## 2018-08-24 ENCOUNTER — Encounter (HOSPITAL_BASED_OUTPATIENT_CLINIC_OR_DEPARTMENT_OTHER): Payer: Self-pay | Admitting: *Deleted

## 2018-08-24 ENCOUNTER — Other Ambulatory Visit: Payer: Self-pay

## 2018-08-26 NOTE — H&P (Signed)
Dawn Rogers Location: [facility] Patient #: [id number] DOB: January 31, 1961 Married / Language: English / Race: White Female        History of Present Illness        This is a 57 year old female who returns to discuss surgical management of her left breast cancer. Her PCP is Dr. Edrick Oh. She was originally seen in the [facility] by Dr. Burr Medico, Dr. Sondra Come and me on August 01, 2018. Originally referred by Curlene Dolphin at [facility]. Her husband and 2 daughters are with her throughout the encounter today.      Recent mammogram showed an area of distortion on the left with a 6 mm mass at the 9:30 position on the left 4 cm from the nipple. Ultrasound of the left axilla was negative. Very dense breast category D. 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 HER-2 negative. Ki-67 10%. Genetic testing is negative. She is interested in breast conservation Subsequently she has had breast MRI which shows a 2.4 cm area of non-mass-like enhancement in the left rest of the 9 o'clock position and a 4 mm nodule behind the left nipple. Second look ultrasound did not show any abnormality in the left breast or the left subareolar area.  MRI guided biopsies were performed on October 14. I discussed the imaging findings in the pathologic findings with Dr. Miquel Dunn today.  Subareolar biopsy is fibrocystic change and Dr. Miquel Dunn thinks this is concordant. The non-mass enhancement also shows fibrocystic disease but Dr. Miquel Dunn thinks this is discordant. She states the clip migrated a bit. She thinks that the entire area should be excised with double bracketed radio active seed localization medial and lateral and that is what we are going to do.      Comorbidities are minimal. Laparoscopic cholecystectomy. Tubal ligation. Left ankle fracture Family history reveals sister was diagnosed with breast cancer age 63. The sister died of lung cancer. Father had esophageal  cancer. Mother had borderline leukemia. One daughter was treated for heart disease Social history reveals she is married with 2 daughter, lives in [place], denies alcohol or tobacco.      I had a long talk with the patient and her family today. We talked about lumpectomy, sentinel node biopsy, radiation therapy. We talked about mastectomy with or without reconstruction. We talked about survival risk. We talked about recurrence risk. In the end she is interested in breast conservation and I think that she is a good candidate for that. She knows the decisions about radiation therapy and chemotherapy will be made after the final pathology is done and she sees the other doctors       She'll be scheduled for injection blue dye left breast, left breast lumpectomy with double bracketed radioactive seed localization, and left axillary sentinel lymph node biopsy We going to ask that the radioactive seeds be placed at the medial and lateral extent of the enhancement. This was Dr. Clarise Cruz recommendation I discussed the indications, details, techniques, and numerous risk of the surgery with her and her family. She is aware of the risk of bleeding, infection, cosmetic deformity, nerve damage with chronic pain, reoperation for positive margins were multiple positive nodes, arm swelling, shoulder disability, arm numbness. She understands all these issues. All her questions are answered. She agrees with this plan.   Allergies  No Known Allergies  Allergies Reconciled   Medication History  Ativan (0.5MG Tablet, Oral) Active. CeleBREX (200MG Capsule, Oral) Active. Cyclobenzaprine HCl (10MG Tablet, Oral)  Active. Duexis (800-26.6MG Tablet, Oral) Active. Finasteride (5MG Tablet, Oral) Active. Flonase (50MCG/DOSE Inhaler, Nasal) Active. FLUoxetine HCl (20MG Capsule, Oral) Active. Synthroid (25MCG Tablet, Oral) Active. Vitamin D (Ergocalciferol) (50000UNIT Capsule, Oral) Active. Medications  Reconciled  Vitals  Weight: 157.13 lb Height: 64in Body Surface Area: 1.77 m Body Mass Index: 26.97 kg/m  Temp.: 98.62F  Pulse: 86 (Regular)  Resp.: 18 (Unlabored)  P.OX: 98% (Room air)       Physical Exam  General Mental Status-Alert. General Appearance-Not in acute distress. Build & Nutrition-Well nourished. Posture-Normal posture. Gait-Normal. Note: BMI 26   Head and Neck Head-normocephalic, atraumatic with no lesions or palpable masses. Trachea-midline. Thyroid Gland Characteristics - normal size and consistency and no palpable nodules.  Chest and Lung Exam Chest and lung exam reveals -on auscultation, normal breath sounds, no adventitious sounds and normal vocal resonance.  Breast Note: Breasts are medium to medium large in size. Ecchymoses have mostly resolved. No mass or hematoma. No adenopathy on either side.   Cardiovascular Cardiovascular examination reveals -normal heart sounds, regular rate and rhythm with no murmurs and femoral artery auscultation bilaterally reveals normal pulses, no bruits, no thrills.  Abdomen Inspection Inspection of the abdomen reveals - No Hernias. Palpation/Percussion Palpation and Percussion of the abdomen reveal - Soft, Non Tender, No Rigidity (guarding), No hepatosplenomegaly and No Palpable abdominal masses. Note: Scars from laparoscopic cholecystectomy and tubal ligation well-healed.   Neurologic Neurologic evaluation reveals -alert and oriented x 3 with no impairment of recent or remote memory, normal attention span and ability to concentrate, normal sensation and normal coordination.  Musculoskeletal Normal Exam - Bilateral-Upper Extremity Strength Normal and Lower Extremity Strength Normal.    Assessment & Plan PRIMARY CANCER OF UPPER INNER QUADRANT OF LEFT FEMALE BREAST (C50.212)   Your recent MRI guided biopsies show no cancer I have reviewed the pathology and the x-ray  findings with the radiologist, Dr. Miquel Dunn. The biopsy behind the nipple is benign fibrocystic disease, and she and I  are both  comfortable that nothing further needs to be done there However, the additional biopsy in the medial left breast, which is benign fibrocystic disease, is felt to be discordant. The radiologist is suspicious and wants this area excised This means that to do a lumpectomy would have to excise a 2.5 cm area of tissue with a margin of extra tissue around it. We think that your lymph nodes are negative, but they will have to be tested as a precaution  We have discussed the options of lumpectomy and sentinel node biopsy and compared that to mastectomy with or without reconstruction. We have discussed the issues of recurrence and survival Your genetic testing is negative, and so  your risk of recurrence and or a second cancer in the future is average for women that have had one breast cancer, , not increased. It is your preference to choose breast conservation surgery, and I think that you're a good candidate for that  you'll be scheduled for injection blue dye left breast, left breast lumpectomy with dual bracketed radioactive seed localization, left axillary sentinel lymph node biopsy. I have discussed the indications, techniques, and risk of the surgery in detail with you, her husband and family  After the surgery is done you will see the other cancer doctors to decide about radiation therapy, antiestrogen therapy, and possibly chemotherapy depending on the results Dr. Dalbert Batman will also continue to follow you  FAMILY HISTORY OF BREAST CANCER IN SISTER (Z80.3) HISTORY OF LAPAROSCOPIC CHOLECYSTECTOMY (Z90.49)  Edsel Petrin. Dalbert Batman, M.D., [facility] General and Minimally invasive Surgery Breast and Colorectal Surgery Office:   [phone] Pager:   [phone]

## 2018-08-27 ENCOUNTER — Other Ambulatory Visit: Payer: Self-pay | Admitting: General Surgery

## 2018-08-27 DIAGNOSIS — Z170 Estrogen receptor positive status [ER+]: Principal | ICD-10-CM

## 2018-08-27 DIAGNOSIS — C50212 Malignant neoplasm of upper-inner quadrant of left female breast: Secondary | ICD-10-CM

## 2018-08-30 ENCOUNTER — Ambulatory Visit
Admission: RE | Admit: 2018-08-30 | Discharge: 2018-08-30 | Disposition: A | Discharge status: Home / Self Care | Payer: BC Managed Care – PPO | Source: Ambulatory Visit | Attending: General Surgery | Admitting: General Surgery

## 2018-08-30 DIAGNOSIS — C50212 Malignant neoplasm of upper-inner quadrant of left female breast: Secondary | ICD-10-CM

## 2018-08-30 DIAGNOSIS — Z170 Estrogen receptor positive status [ER+]: Principal | ICD-10-CM

## 2018-08-30 NOTE — Progress Notes (Signed)
CHG pre-surgical wash given to patient with instructions for use. Pre-surgical Ensure given to patient as well with instructions to finish before 07:45am DOS. Patient verbalizes understanding.

## 2018-08-31 ENCOUNTER — Ambulatory Visit (HOSPITAL_BASED_OUTPATIENT_CLINIC_OR_DEPARTMENT_OTHER)
Admission: RE | Admit: 2018-08-31 | Discharge: 2018-08-31 | Disposition: A | Discharge status: Home / Self Care | Payer: BC Managed Care – PPO | Source: Ambulatory Visit | Attending: General Surgery | Admitting: General Surgery

## 2018-08-31 ENCOUNTER — Ambulatory Visit
Admission: RE | Admit: 2018-08-31 | Discharge: 2018-08-31 | Disposition: A | Discharge status: Home / Self Care | Payer: BC Managed Care – PPO | Source: Ambulatory Visit | Attending: General Surgery | Admitting: General Surgery

## 2018-08-31 ENCOUNTER — Encounter (HOSPITAL_BASED_OUTPATIENT_CLINIC_OR_DEPARTMENT_OTHER)
Admission: RE | Disposition: A | Discharge status: Home / Self Care | Payer: Self-pay | Source: Ambulatory Visit | Attending: General Surgery

## 2018-08-31 ENCOUNTER — Other Ambulatory Visit: Payer: Self-pay

## 2018-08-31 ENCOUNTER — Encounter (HOSPITAL_COMMUNITY)
Admission: RE | Admit: 2018-08-31 | Discharge: 2018-08-31 | Disposition: A | Discharge status: Home / Self Care | Payer: BC Managed Care – PPO | Source: Ambulatory Visit | Attending: General Surgery | Admitting: General Surgery

## 2018-08-31 ENCOUNTER — Ambulatory Visit (HOSPITAL_BASED_OUTPATIENT_CLINIC_OR_DEPARTMENT_OTHER): Payer: BC Managed Care – PPO | Admitting: Anesthesiology

## 2018-08-31 ENCOUNTER — Encounter (HOSPITAL_BASED_OUTPATIENT_CLINIC_OR_DEPARTMENT_OTHER): Payer: Self-pay | Admitting: Certified Registered"

## 2018-08-31 DIAGNOSIS — C50212 Malignant neoplasm of upper-inner quadrant of left female breast: Secondary | ICD-10-CM

## 2018-08-31 DIAGNOSIS — N6082 Other benign mammary dysplasias of left breast: Secondary | ICD-10-CM | POA: Diagnosis not present

## 2018-08-31 DIAGNOSIS — Z170 Estrogen receptor positive status [ER+]: Principal | ICD-10-CM

## 2018-08-31 DIAGNOSIS — K219 Gastro-esophageal reflux disease without esophagitis: Secondary | ICD-10-CM | POA: Insufficient documentation

## 2018-08-31 DIAGNOSIS — E039 Hypothyroidism, unspecified: Secondary | ICD-10-CM | POA: Diagnosis not present

## 2018-08-31 DIAGNOSIS — N6489 Other specified disorders of breast: Secondary | ICD-10-CM | POA: Diagnosis not present

## 2018-08-31 DIAGNOSIS — Z79899 Other long term (current) drug therapy: Secondary | ICD-10-CM | POA: Diagnosis not present

## 2018-08-31 DIAGNOSIS — N6012 Diffuse cystic mastopathy of left breast: Secondary | ICD-10-CM | POA: Diagnosis not present

## 2018-08-31 DIAGNOSIS — Z803 Family history of malignant neoplasm of breast: Secondary | ICD-10-CM | POA: Insufficient documentation

## 2018-08-31 HISTORY — DX: Hypothyroidism, unspecified: E03.9

## 2018-08-31 HISTORY — DX: Anxiety disorder, unspecified: F41.9

## 2018-08-31 HISTORY — PX: BREAST LUMPECTOMY WITH RADIOACTIVE SEED AND SENTINEL LYMPH NODE BIOPSY: SHX6550

## 2018-08-31 HISTORY — DX: Cervicalgia: M54.2

## 2018-08-31 HISTORY — DX: Malignant (primary) neoplasm, unspecified: C80.1

## 2018-08-31 HISTORY — DX: Gastro-esophageal reflux disease without esophagitis: K21.9

## 2018-08-31 SURGERY — BREAST LUMPECTOMY WITH RADIOACTIVE SEED AND SENTINEL LYMPH NODE BIOPSY
Anesthesia: General | Site: Breast | Laterality: Left

## 2018-08-31 MED ORDER — CELECOXIB 200 MG PO CAPS
ORAL_CAPSULE | ORAL | Status: AC
  Filled 2018-08-31: qty 1

## 2018-08-31 MED ORDER — EPHEDRINE SULFATE 50 MG/ML IJ SOLN
INTRAMUSCULAR | Status: DC | PRN
  Administered 2018-08-31: 10 mg via INTRAVENOUS

## 2018-08-31 MED ORDER — TECHNETIUM TC 99M SULFUR COLLOID FILTERED
1.0000 | Freq: Once | INTRAVENOUS | Status: AC | PRN
  Administered 2018-08-31: 1 via INTRADERMAL

## 2018-08-31 MED ORDER — DEXAMETHASONE SODIUM PHOSPHATE 4 MG/ML IJ SOLN
INTRAMUSCULAR | Status: DC | PRN
  Administered 2018-08-31: 10 mg via INTRAVENOUS

## 2018-08-31 MED ORDER — GABAPENTIN 300 MG PO CAPS: 300.0000 mg | ORAL_CAPSULE | ORAL | Status: DC

## 2018-08-31 MED ORDER — FENTANYL CITRATE (PF) 100 MCG/2ML IJ SOLN
INTRAMUSCULAR | Status: AC
  Filled 2018-08-31: qty 2

## 2018-08-31 MED ORDER — ACETAMINOPHEN 500 MG PO TABS: 1000.0000 mg | ORAL_TABLET | ORAL | Status: DC

## 2018-08-31 MED ORDER — CEFAZOLIN SODIUM-DEXTROSE 2-4 GM/100ML-% IV SOLN
2.0000 g | INTRAVENOUS | Status: AC
  Administered 2018-08-31: 2 g via INTRAVENOUS

## 2018-08-31 MED ORDER — ACETAMINOPHEN 500 MG PO TABS
1000.0000 mg | ORAL_TABLET | ORAL | Status: AC
  Administered 2018-08-31: 1000 mg via ORAL

## 2018-08-31 MED ORDER — CHLORHEXIDINE GLUCONATE CLOTH 2 % EX PADS: 6.0000 | MEDICATED_PAD | Freq: Once | CUTANEOUS | Status: DC

## 2018-08-31 MED ORDER — GABAPENTIN 300 MG PO CAPS
300.0000 mg | ORAL_CAPSULE | ORAL | Status: AC
  Administered 2018-08-31: 300 mg via ORAL

## 2018-08-31 MED ORDER — LIDOCAINE HCL (CARDIAC) PF 100 MG/5ML IV SOSY
PREFILLED_SYRINGE | INTRAVENOUS | Status: DC | PRN
  Administered 2018-08-31: 30 mg via INTRAVENOUS

## 2018-08-31 MED ORDER — GABAPENTIN 300 MG PO CAPS
ORAL_CAPSULE | ORAL | Status: AC
  Filled 2018-08-31: qty 1

## 2018-08-31 MED ORDER — SODIUM CHLORIDE 0.9 % IJ SOLN
INTRAVENOUS | Status: DC | PRN
  Administered 2018-08-31: 5 mL via INTRAMUSCULAR

## 2018-08-31 MED ORDER — CLONIDINE HCL (ANALGESIA) 100 MCG/ML EP SOLN
EPIDURAL | Status: DC | PRN
  Administered 2018-08-31: 75 ug

## 2018-08-31 MED ORDER — HYDROCODONE-ACETAMINOPHEN 5-325 MG PO TABS: 1.0000 | ORAL_TABLET | Freq: Four times a day (QID) | ORAL | 0 refills | Status: DC | PRN

## 2018-08-31 MED ORDER — ONDANSETRON HCL 4 MG/2ML IJ SOLN: 4.0000 mg | Freq: Once | INTRAMUSCULAR | Status: DC | PRN

## 2018-08-31 MED ORDER — BUPIVACAINE-EPINEPHRINE (PF) 0.25% -1:200000 IJ SOLN
INTRAMUSCULAR | Status: DC | PRN
  Administered 2018-08-31: 16 mL via PERINEURAL

## 2018-08-31 MED ORDER — PROPOFOL 500 MG/50ML IV EMUL
INTRAVENOUS | Status: DC | PRN
  Administered 2018-08-31: 25 ug/kg/min via INTRAVENOUS

## 2018-08-31 MED ORDER — FENTANYL CITRATE (PF) 100 MCG/2ML IJ SOLN
50.0000 ug | INTRAMUSCULAR | Status: DC | PRN
  Administered 2018-08-31: 50 ug via INTRAVENOUS
  Administered 2018-08-31: 100 ug via INTRAVENOUS

## 2018-08-31 MED ORDER — MIDAZOLAM HCL 2 MG/2ML IJ SOLN
INTRAMUSCULAR | Status: AC
  Filled 2018-08-31: qty 2

## 2018-08-31 MED ORDER — ROPIVACAINE HCL 7.5 MG/ML IJ SOLN
INTRAMUSCULAR | Status: DC | PRN
  Administered 2018-08-31: 25 mL via PERINEURAL

## 2018-08-31 MED ORDER — SCOPOLAMINE 1 MG/3DAYS TD PT72: 1.0000 | MEDICATED_PATCH | Freq: Once | TRANSDERMAL | Status: DC | PRN

## 2018-08-31 MED ORDER — LACTATED RINGERS IV SOLN
INTRAVENOUS | Status: DC
  Administered 2018-08-31 (×2): via INTRAVENOUS

## 2018-08-31 MED ORDER — METHYLENE BLUE 0.5 % INJ SOLN
INTRAVENOUS | Status: AC
  Filled 2018-08-31: qty 10

## 2018-08-31 MED ORDER — CELECOXIB 200 MG PO CAPS: 200.0000 mg | ORAL_CAPSULE | ORAL | Status: DC

## 2018-08-31 MED ORDER — CELECOXIB 200 MG PO CAPS
200.0000 mg | ORAL_CAPSULE | ORAL | Status: AC
  Administered 2018-08-31: 200 mg via ORAL

## 2018-08-31 MED ORDER — ONDANSETRON HCL 4 MG/2ML IJ SOLN
INTRAMUSCULAR | Status: DC | PRN
  Administered 2018-08-31: 4 mg via INTRAVENOUS

## 2018-08-31 MED ORDER — ACETAMINOPHEN 500 MG PO TABS
ORAL_TABLET | ORAL | Status: AC
  Filled 2018-08-31: qty 2

## 2018-08-31 MED ORDER — MIDAZOLAM HCL 2 MG/2ML IJ SOLN
1.0000 mg | INTRAMUSCULAR | Status: DC | PRN
  Administered 2018-08-31: 2 mg via INTRAVENOUS

## 2018-08-31 MED ORDER — OXYCODONE HCL 5 MG PO TABS: 5.0000 mg | ORAL_TABLET | Freq: Once | ORAL | Status: DC | PRN

## 2018-08-31 MED ORDER — CEFAZOLIN SODIUM-DEXTROSE 2-4 GM/100ML-% IV SOLN: 2.0000 g | INTRAVENOUS | Status: DC

## 2018-08-31 MED ORDER — FENTANYL CITRATE (PF) 100 MCG/2ML IJ SOLN: 25.0000 ug | INTRAMUSCULAR | Status: DC | PRN

## 2018-08-31 MED ORDER — ACETAMINOPHEN 160 MG/5ML PO SOLN: 325.0000 mg | ORAL | Status: DC | PRN

## 2018-08-31 MED ORDER — MEPERIDINE HCL 25 MG/ML IJ SOLN: 6.2500 mg | INTRAMUSCULAR | Status: DC | PRN

## 2018-08-31 MED ORDER — CEFAZOLIN SODIUM-DEXTROSE 2-4 GM/100ML-% IV SOLN
INTRAVENOUS | Status: AC
  Filled 2018-08-31: qty 100

## 2018-08-31 MED ORDER — PROPOFOL 10 MG/ML IV BOLUS
INTRAVENOUS | Status: DC | PRN
  Administered 2018-08-31: 120 mg via INTRAVENOUS

## 2018-08-31 MED ORDER — OXYCODONE HCL 5 MG/5ML PO SOLN: 5.0000 mg | Freq: Once | ORAL | Status: DC | PRN

## 2018-08-31 MED ORDER — ACETAMINOPHEN 325 MG PO TABS: 325.0000 mg | ORAL_TABLET | ORAL | Status: DC | PRN

## 2018-08-31 SURGICAL SUPPLY — 65 items
ADH SKN CLS APL DERMABOND .7 (GAUZE/BANDAGES/DRESSINGS) ×1
APPLIER CLIP 11 MED OPEN (CLIP) ×3
APR CLP MED 11 20 MLT OPN (CLIP) ×1
BANDAGE ACE 6X5 VEL STRL LF (GAUZE/BANDAGES/DRESSINGS) IMPLANT
BINDER BREAST LRG (GAUZE/BANDAGES/DRESSINGS) ×2 IMPLANT
BINDER BREAST MEDIUM (GAUZE/BANDAGES/DRESSINGS) IMPLANT
BINDER BREAST XLRG (GAUZE/BANDAGES/DRESSINGS) IMPLANT
BINDER BREAST XXLRG (GAUZE/BANDAGES/DRESSINGS) IMPLANT
BLADE HEX COATED 2.75 (ELECTRODE) ×3 IMPLANT
BLADE SURG 15 STRL LF DISP TIS (BLADE) ×2 IMPLANT
BLADE SURG 15 STRL SS (BLADE) ×3
CANISTER SUCT 1200ML W/VALVE (MISCELLANEOUS) ×3 IMPLANT
CHLORAPREP W/TINT 26ML (MISCELLANEOUS) ×3 IMPLANT
CLIP APPLIE 11 MED OPEN (CLIP) ×1 IMPLANT
COVER BACK TABLE 60X90IN (DRAPES) ×3 IMPLANT
COVER MAYO STAND STRL (DRAPES) ×3 IMPLANT
COVER PROBE W GEL 5X96 (DRAPES) ×3 IMPLANT
COVER WAND RF STERILE (DRAPES) IMPLANT
DECANTER SPIKE VIAL GLASS SM (MISCELLANEOUS) IMPLANT
DERMABOND ADVANCED (GAUZE/BANDAGES/DRESSINGS) ×2
DERMABOND ADVANCED .7 DNX12 (GAUZE/BANDAGES/DRESSINGS) IMPLANT
DEVICE DUBIN W/COMP PLATE 8390 (MISCELLANEOUS) ×3 IMPLANT
DRAPE LAPAROSCOPIC ABDOMINAL (DRAPES) ×3 IMPLANT
DRAPE UTILITY XL STRL (DRAPES) ×3 IMPLANT
DRSG PAD ABDOMINAL 8X10 ST (GAUZE/BANDAGES/DRESSINGS) ×2 IMPLANT
ELECT REM PT RETURN 9FT ADLT (ELECTROSURGICAL) ×3
ELECTRODE REM PT RTRN 9FT ADLT (ELECTROSURGICAL) ×1 IMPLANT
GAUZE SPONGE 4X4 12PLY STRL (GAUZE/BANDAGES/DRESSINGS) ×3 IMPLANT
GAUZE SPONGE 4X4 12PLY STRL LF (GAUZE/BANDAGES/DRESSINGS) IMPLANT
GLOVE BIOGEL PI IND STRL 7.0 (GLOVE) IMPLANT
GLOVE BIOGEL PI INDICATOR 7.0 (GLOVE) ×2
GLOVE ECLIPSE 6.5 STRL STRAW (GLOVE) ×2 IMPLANT
GLOVE EUDERMIC 7 POWDERFREE (GLOVE) ×3 IMPLANT
GOWN STRL REUS W/ TWL LRG LVL3 (GOWN DISPOSABLE) ×1 IMPLANT
GOWN STRL REUS W/ TWL XL LVL3 (GOWN DISPOSABLE) ×1 IMPLANT
GOWN STRL REUS W/TWL LRG LVL3 (GOWN DISPOSABLE) ×6
GOWN STRL REUS W/TWL XL LVL3 (GOWN DISPOSABLE) ×3
ILLUMINATOR WAVEGUIDE N/F (MISCELLANEOUS) IMPLANT
KIT MARKER MARGIN INK (KITS) ×3 IMPLANT
LIGHT WAVEGUIDE WIDE FLAT (MISCELLANEOUS) IMPLANT
NDL HYPO 25X1 1.5 SAFETY (NEEDLE) ×2 IMPLANT
NDL SAFETY ECLIPSE 18X1.5 (NEEDLE) ×1 IMPLANT
NEEDLE HYPO 18GX1.5 SHARP (NEEDLE) ×3
NEEDLE HYPO 25X1 1.5 SAFETY (NEEDLE) ×6 IMPLANT
NS IRRIG 1000ML POUR BTL (IV SOLUTION) ×3 IMPLANT
PACK BASIN DAY SURGERY FS (CUSTOM PROCEDURE TRAY) ×3 IMPLANT
PAD ALCOHOL SWAB (MISCELLANEOUS) ×3 IMPLANT
PENCIL BUTTON HOLSTER BLD 10FT (ELECTRODE) ×3 IMPLANT
SHEET MEDIUM DRAPE 40X70 STRL (DRAPES) ×3 IMPLANT
SLEEVE SCD COMPRESS KNEE MED (MISCELLANEOUS) ×3 IMPLANT
SPONGE LAP 18X18 RF (DISPOSABLE) ×4 IMPLANT
SPONGE LAP 4X18 RFD (DISPOSABLE) ×1 IMPLANT
SUT MNCRL AB 4-0 PS2 18 (SUTURE) ×6 IMPLANT
SUT SILK 2 0 SH (SUTURE) ×3 IMPLANT
SUT VIC AB 2-0 CT1 27 (SUTURE)
SUT VIC AB 2-0 CT1 TAPERPNT 27 (SUTURE) IMPLANT
SUT VIC AB 3-0 SH 27 (SUTURE)
SUT VIC AB 3-0 SH 27X BRD (SUTURE) IMPLANT
SUT VICRYL 3-0 CR8 SH (SUTURE) ×5 IMPLANT
SYR 10ML LL (SYRINGE) ×6 IMPLANT
TOWEL GREEN STERILE FF (TOWEL DISPOSABLE) ×6 IMPLANT
TOWEL OR NON WOVEN STRL DISP B (DISPOSABLE) ×1 IMPLANT
TUBE CONNECTING 20'X1/4 (TUBING) ×1
TUBE CONNECTING 20X1/4 (TUBING) ×2 IMPLANT
YANKAUER SUCT BULB TIP NO VENT (SUCTIONS) ×3 IMPLANT

## 2018-08-31 NOTE — Progress Notes (Signed)
Emotional support during breast injections °

## 2018-08-31 NOTE — Interval H&P Note (Signed)
History and Physical Interval Note:  08/31/2018 11:40 AM  Dawn Rogers  has presented today for surgery, with the diagnosis of LEFT BREAST CANCER  The various methods of treatment have been discussed with the patient and family. After consideration of risks, benefits and other options for treatment, the patient has consented to  Procedure(s): LEFT DOUBLE BRACKETED LUMPECTOMY WITH RADIOACTIVE SEED X'S 2, LEFT SENTINEL LYMPH NODE BIOPSY, INJECT BLUE DYE LEFT BREAST (Left) as a surgical intervention .  The patient's history has been reviewed, patient examined, no change in status, stable for surgery.  I have reviewed the patient's chart and labs.  Questions were answered to the patient's satisfaction.     Adin Hector

## 2018-08-31 NOTE — Anesthesia Preprocedure Evaluation (Signed)
Anesthesia Evaluation  Patient identified by MRN, date of birth, ID band Patient awake    Reviewed: Allergy & Precautions, H&P , NPO status , Patient's Chart, lab work & pertinent test results, reviewed documented beta blocker date and time   Airway Mallampati: II  TM Distance: >3 FB Neck ROM: full    Dental no notable dental hx.    Pulmonary neg pulmonary ROS,    Pulmonary exam normal breath sounds clear to auscultation       Cardiovascular Exercise Tolerance: Good negative cardio ROS   Rhythm:regular Rate:Normal     Neuro/Psych negative neurological ROS  negative psych ROS   GI/Hepatic Neg liver ROS, GERD  Medicated,  Endo/Other  Hypothyroidism   Renal/GU negative Renal ROS  negative genitourinary   Musculoskeletal   Abdominal   Peds  Hematology negative hematology ROS (+)   Anesthesia Other Findings   Reproductive/Obstetrics negative OB ROS                             Anesthesia Physical Anesthesia Plan  ASA: II  Anesthesia Plan: General   Post-op Pain Management: GA combined w/ Regional for post-op pain   Induction:   PONV Risk Score and Plan: 3 and Treatment may vary due to age or medical condition, Dexamethasone and Midazolam  Airway Management Planned: Oral ETT and LMA  Additional Equipment: None  Intra-op Plan:   Post-operative Plan: Extubation in OR  Informed Consent: I have reviewed the patients History and Physical, chart, labs and discussed the procedure including the risks, benefits and alternatives for the proposed anesthesia with the patient or authorized representative who has indicated his/her understanding and acceptance.   Dental Advisory Given  Plan Discussed with: CRNA, Anesthesiologist and Surgeon  Anesthesia Plan Comments: (Discussed both nerve block for pain relief post-op and GA; including NV, sore throat, dental injury, and pulmonary  complications)        Anesthesia Quick Evaluation

## 2018-08-31 NOTE — Op Note (Signed)
Patient Name:           Dawn Rogers   Date of Surgery:        08/31/2018  Pre op Diagnosis:      Cancer left breast upper inner quadrant  Post op Diagnosis:    Same  Procedure:                 Inject blue dye left breast                                      Left breast lumpectomy with double bracketed radioactive seed localization                                      Left axillary deep sentinel lymph node biopsy  Surgeon:                     Edsel Petrin. Dalbert Batman, M.D., FACS  Assistant:                      OR staff  Operative Indications:    This is a 57 year old female who is brought to the operating room for definitive surgical management of her left breast cancer. Her PCP is Dr. Edrick Oh. She was originally seen in the [facility] by Dr. Burr Medico, Dr. Sondra Come and me on August 01, 2018. Originally referred by Curlene Dolphin at [facility]. Her husband and 2 daughters are with her throughout the encounter today.      Recent mammogram showed an area of distortion on the left with a 6 mm mass at the 9:30 position on the left 4 cm from the nipple. Ultrasound of the left axilla was negative. Very dense breast category D. 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 HER-2 negative. Ki-67 10%. Genetic testing is negative. She is interested in breast conservation Subsequently she has had breast MRI which shows a 2.4 cm area of non-mass-like enhancement in the left rest of the 9 o'clock position and a 4 mm nodule behind the left nipple. Second look ultrasound did not show any abnormality in the left breast or the left subareolar area.  MRI guided biopsies were performed on October 14. I discussed the imaging findings in the pathologic findings with Dr. Miquel Dunn today.  Subareolar biopsy is fibrocystic change and Dr. Miquel Dunn thinks this is concordant. The non-mass enhancement also shows fibrocystic disease but Dr. Miquel Dunn thinks this is discordant. She states the clip  migrated a bit. She thinks that the entire area should be excised with double bracketed radio active seed localization medial and lateral and that is what we are going to do      I had a long talk with the patient and her family today. We talked about lumpectomy, sentinel node biopsy, radiation therapy. We talked about mastectomy with or without reconstruction. We talked about survival risk. We talked about recurrence risk. In the end she is interested in breast conservation and I think that she is a good candidate for that. She knows the decisions about radiation therapy and chemotherapy will be made after the final pathology is done and she sees the other doctors       She'll be scheduled for injection blue dye left breast, left breast lumpectomy with double bracketed radioactive  seed localization, and left axillary sentinel lymph node biopsy We going to ask that the radioactive seeds be placed at the medial and lateral extent of the enhancement. This was Dr. Clarise Cruz recommendation   Operative Findings:       The radioactive seeds were placed on a medial and lateral position.  They appear to be 5 or 6 cm apart on the imaging studies and at the time of surgery.  To get all of this out I had to make a superiorly placed circumareolar incision and extend that medially.  The specimen was probably 7 cm in transverse dimension at least.  The specimen mammogram looked good and contained both seeds and both marker clips.  I found 3 axillary sentinel lymph nodes.  Procedure in Detail:          Pectoral block was performed in the holding area.  Radionuclide was injected into the left breast in the holding area.  The patient was taken to the operating room and underwent general anesthesia with LMA device.  Surgical timeout was performed.  Intravenous antibiotics were given.  Following alcohol prep I injected 5 cc of dilute methylene blue into the left breast subareolar area and massaged the breast for a few  minutes.  The entire left chest wall and axilla were then prepped and draped in a sterile fashion.  0.25% Marcaine with epinephrine was used as a local infiltration anesthetic.      Using the neoprobe I scanned the left breast and found both radioactive seeds.  One was directly behind the nipple and one was somewhat medially placed.  I made a superiorly placed circumareolar incision at the areolar margin and extended that medially for about 2-1/2 cm.  Lumpectomy was performed using neoprobe and electrocautery.  Relatively large specimen was removed.  Specimen mammogram looked good as described above.  The specimen was sent to the lab where the seeds were retrieved.  The wound was irrigated.  Hemostasis was achieved with electrocautery.  5 metal marker clips were placed in the walls of the lumpectomy cavity.  The breast tissues were reapproximated in several layers with interrupted 3-0 Vicryl sutures and the skin reapproximated with subcuticular 4-0 Monocryl and Dermabond.     A transverse incision was made at the hairline.  Of the left axilla.  Dissection was carried down through the subcutaneous tissue and the clavipectoral fascia was divided.Marland Kitchen  Retractors were placed.  Using the neoprobe I found 3 sentinel lymph nodes.  These were all very hot and all very blue.  That was all that I found.  These were sent as separate specimens.  Hemostasis was excellent and achieved with electrocautery and metal clips.  The wound was irrigated.  The clavipectoral fascia was closed with interrupted 3-0 Vicryl sutures and the skin closed with a running subcuticular 4-0 Monocryl and Dermabond.  Dry bandages and a breast binder were placed.  The patient tolerated the procedure well was taken to PACU in stable condition.  EBL 20-30 cc.  Counts correct.  Complications none.    Addendum: I logged onto the [employer] and reviewed her prescription medication history.     Edsel Petrin. Dalbert Batman, M.D., FACS General and Minimally  Invasive Surgery Breast and Colorectal Surgery  08/31/2018 2:02 PM

## 2018-08-31 NOTE — Anesthesia Procedure Notes (Signed)
Procedure Name: LMA Insertion Date/Time: 08/31/2018 12:25 PM Performed by: Signe Colt, CRNA Pre-anesthesia Checklist: Patient identified, Emergency Drugs available, Suction available and Patient being monitored Patient Re-evaluated:Patient Re-evaluated prior to induction Oxygen Delivery Method: Circle system utilized Preoxygenation: Pre-oxygenation with 100% oxygen Induction Type: IV induction Ventilation: Mask ventilation without difficulty LMA: LMA inserted LMA Size: 4.0 Number of attempts: 1 Airway Equipment and Method: Bite block Placement Confirmation: positive ETCO2 Tube secured with: Tape Dental Injury: Teeth and Oropharynx as per pre-operative assessment

## 2018-08-31 NOTE — Progress Notes (Signed)
Assisted Dr. Oddono with left, ultrasound guided, pectoralis block. Side rails up, monitors on throughout procedure. See vital signs in flow sheet. Tolerated Procedure well. °

## 2018-08-31 NOTE — Anesthesia Procedure Notes (Signed)
Anesthesia Regional Block: Pectoralis block   Pre-Anesthetic Checklist: ,, timeout performed, Correct Patient, Correct Site, Correct Laterality, Correct Procedure, Correct Position, site marked, Risks and benefits discussed,  Surgical consent,  Pre-op evaluation,  At surgeon's request and post-op pain management  Laterality: Right  Prep: chloraprep       Needles:  Injection technique: Single-shot  Needle Type: Echogenic Stimulator Needle     Needle Length: 5cm  Needle Gauge: 22     Additional Needles:   Procedures:, nerve stimulator,,, ultrasound used (permanent image in chart),,,,  Narrative:  Start time: 08/31/2018 10:20 AM End time: 08/31/2018 10:25 AM Injection made incrementally with aspirations every 5 mL.  Performed by: Personally  Anesthesiologist: Janeece Riggers, MD  Additional Notes: Functioning IV was confirmed and monitors were applied.  A 22mm 22ga Arrow echogenic stimulator needle was used. Sterile prep and drape,hand hygiene and sterile gloves were used. Ultrasound guidance: relevant anatomy identified, needle position confirmed, local anesthetic spread visualized around nerve(s)., vascular puncture avoided.  Image printed for medical record. Negative aspiration and negative test dose prior to incremental administration of local anesthetic. The patient tolerated the procedure well.

## 2018-08-31 NOTE — Transfer of Care (Signed)
Immediate Anesthesia Transfer of Care Note  Patient: Dawn Rogers  Procedure(s) Performed: LEFT DOUBLE BRACKETED LUMPECTOMY WITH RADIOACTIVE SEED X'S 2, LEFT SENTINEL LYMPH NODE BIOPSY, INJECT BLUE DYE LEFT BREAST (Left Breast)  Patient Location: PACU  Anesthesia Type:GA combined with regional for post-op pain  Level of Consciousness: sedated and patient cooperative  Airway & Oxygen Therapy: Patient Spontanous Breathing and Patient connected to face mask oxygen  Post-op Assessment: Report given to RN and Post -op Vital signs reviewed and stable  Post vital signs: Reviewed and stable  Last Vitals:  Vitals Value Taken Time  BP    Temp    Pulse 92 08/31/2018  1:58 PM  Resp    SpO2 100 % 08/31/2018  1:58 PM  Vitals shown include unvalidated device data.  Last Pain:  Vitals:   08/31/18 0956  TempSrc: Oral  PainSc: 0-No pain      Patients Stated Pain Goal: 1 (58/59/29 2446)  Complications: No apparent anesthesia complications

## 2018-08-31 NOTE — Discharge Instructions (Signed)
[facility] °Office Phone Number [phone] ° °BREAST BIOPSY/ LUMPECTOMY: POST OP INSTRUCTIONS ° °Always review your discharge instruction sheet given to you by the facility where your surgery was performed. ° °IF YOU HAVE DISABILITY OR FAMILY LEAVE FORMS, YOU MUST BRING THEM TO THE OFFICE FOR PROCESSING.  DO NOT GIVE THEM TO YOUR DOCTOR. ° °1. A prescription for pain medication may be given to you upon discharge.  Take your pain medication as prescribed, if needed.  If narcotic pain medicine is not needed, then you may take acetaminophen (Tylenol) or ibuprofen (Advil) as needed. °2. Take your usually prescribed medications unless otherwise directed °3. If you need a refill on your pain medication, please contact your pharmacy.  They will contact our office to request authorization.  Prescriptions will not be filled after 5pm or on week-ends. °4. You should eat very light the first 24 hours after surgery, such as soup, crackers, pudding, etc.  Resume your normal diet the day after surgery. °5. Most patients will experience some swelling and bruising in the breast.  Ice packs and a good support bra will help.  Swelling and bruising can take several days to resolve.  °6. It is common to experience some constipation if taking pain medication after surgery.  Increasing fluid intake and taking a stool softener will usually help or prevent this problem from occurring.  A mild laxative (Milk of Magnesia or Miralax) should be taken according to package directions if there are no bowel movements after 48 hours. °7. Unless discharge instructions indicate otherwise, you may remove your bandages 24-48 hours after surgery, and you may shower at that time.  You may have steri-strips (small skin tapes) in place directly over the incision.  These strips should be left on the skin for 7-10 days.  If your surgeon used skin glue on the incision, you may shower in 24 hours.  The glue will flake off over the next 2-3  weeks.  Any sutures or staples will be removed at the office during your follow-up visit. °8. ACTIVITIES:  You may resume regular daily activities (gradually increasing) beginning the next day.  Wearing a good support bra or sports bra minimizes pain and swelling.  You may have sexual intercourse when it is comfortable. °a. You may drive when you no longer are taking prescription pain medication, you can comfortably wear a seatbelt, and you can safely maneuver your car and apply brakes. °b. RETURN TO WORK:  ______________________________________________________________________________________ °9. You should see your doctor in the office for a follow-up appointment approximately two weeks after your surgery.  Your doctor’s nurse will typically make your follow-up appointment when she calls you with your pathology report.  Expect your pathology report 2-3 business days after your surgery.  You may call to check if you do not hear from us after three days. °10. OTHER INSTRUCTIONS: _______________________________________________________________________________________________ _____________________________________________________________________________________________________________________________________ °_____________________________________________________________________________________________________________________________________ °_____________________________________________________________________________________________________________________________________ ° °WHEN TO CALL YOUR DOCTOR: °1. Fever over 101.0 °2. Nausea and/or vomiting. °3. Extreme swelling or bruising. °4. Continued bleeding from incision. °5. Increased pain, redness, or drainage from the incision. ° °The clinic staff is available to answer your questions during regular business hours.  Please don’t hesitate to call and ask to speak to one of the nurses for clinical concerns.  If you have a medical emergency, go to the nearest emergency  room or call 911.  A surgeon from [facility] is always on call at the hospital. ° °For further questions, please visit [email or web address]  ° °  Post Anesthesia Home Care Instructions ° °Activity: °Get plenty of rest for the remainder of the day. A responsible individual must stay with you for 24 hours following the procedure.  °For the next 24 hours, DO NOT: °-Drive a car °-Operate machinery °-Drink alcoholic beverages °-Take any medication unless instructed by your physician °-Make any legal decisions or sign important papers. ° °Meals: °Start with liquid foods such as gelatin or soup. Progress to regular foods as tolerated. Avoid greasy, spicy, heavy foods. If nausea and/or vomiting occur, drink only clear liquids until the nausea and/or vomiting subsides. Call your physician if vomiting continues. ° °Special Instructions/Symptoms: °Your throat may feel dry or sore from the anesthesia or the breathing tube placed in your throat during surgery. If this causes discomfort, gargle with warm salt water. The discomfort should disappear within 24 hours. ° °If you had a scopolamine patch placed behind your ear for the management of post- operative nausea and/or vomiting: ° °1. The medication in the patch is effective for 72 hours, after which it should be removed.  Wrap patch in a tissue and discard in the trash. Wash hands thoroughly with soap and water. °2. You may remove the patch earlier than 72 hours if you experience unpleasant side effects which may include dry mouth, dizziness or visual disturbances. °3. Avoid touching the patch. Wash your hands with soap and water after contact with the patch. °  ° ° °

## 2018-09-03 ENCOUNTER — Encounter (HOSPITAL_BASED_OUTPATIENT_CLINIC_OR_DEPARTMENT_OTHER): Payer: Self-pay | Admitting: General Surgery

## 2018-09-03 NOTE — Anesthesia Postprocedure Evaluation (Signed)
Anesthesia Post Note  Patient: MONIFAH FREEHLING  Procedure(s) Performed: LEFT DOUBLE BRACKETED LUMPECTOMY WITH RADIOACTIVE SEED X'S 2, LEFT SENTINEL LYMPH NODE BIOPSY, INJECT BLUE DYE LEFT BREAST (Left Breast)     Patient location during evaluation: PACU Anesthesia Type: General Level of consciousness: awake and alert Pain management: pain level controlled Vital Signs Assessment: post-procedure vital signs reviewed and stable Respiratory status: spontaneous breathing, nonlabored ventilation, respiratory function stable and patient connected to nasal cannula oxygen Cardiovascular status: blood pressure returned to baseline and stable Postop Assessment: no apparent nausea or vomiting Anesthetic complications: no    Last Vitals:  Vitals:   08/31/18 1445 08/31/18 1502  BP: 128/66 131/69  Pulse: 92 97  Resp: 11 16  Temp:  36.7 C  SpO2: 97% 97%    Last Pain:  Vitals:   08/31/18 0956  TempSrc: Oral  PainSc: 0-No pain                 Katya Rolston

## 2018-09-04 ENCOUNTER — Encounter: Payer: Self-pay | Admitting: *Deleted

## 2018-09-04 DIAGNOSIS — C50212 Malignant neoplasm of upper-inner quadrant of left female breast: Secondary | ICD-10-CM

## 2018-09-04 DIAGNOSIS — Z170 Estrogen receptor positive status [ER+]: Principal | ICD-10-CM

## 2018-09-04 NOTE — Progress Notes (Signed)
Inform patient of Pathology report,. The cancer in her breast was 7 mm diameter.  Very small. Margins are negative All lymph nodes are negative This is very good news and she will not need any further surgery Let me know that you reached her.   hmi

## 2018-09-11 DIAGNOSIS — M4712 Other spondylosis with myelopathy, cervical region: Secondary | ICD-10-CM | POA: Insufficient documentation

## 2018-09-11 DIAGNOSIS — M5412 Radiculopathy, cervical region: Secondary | ICD-10-CM | POA: Insufficient documentation

## 2018-09-11 NOTE — Progress Notes (Signed)
Location of Breast Cancer:  Malignant neoplasm of upper-inner quadrant of left breast in female, estrogen receptor positive [facility])  Histology per Pathology Report: 08/31/18: 1. Breast, lumpectomy, left - INVASIVE DUCTAL CARCINOMA, NOTTINGHAM GRADE 1 OF 3, 0.7 CM - CALCIFICATIONS ASSOCIATED WITH CARCINOMA - MARGINS UNINVOLVED BY CARCINOMA (0.5 CM; POSTERIOR MARGIN) - COLUMNAR CELL AND FIBROCYSTIC CHANGES INCLUDING APOCRINE METAPLASIA - PSEUDOANGIOMATOUS STROMAL HYPERPLASIA - PREVIOUS BIOPSY SITE CHANGES PRESENT - SEE ONCOLOGY TABLE AND COMMENT BELOW 2. Lymph node, sentinel, biopsy, left axillary #1 - NO CARCINOMA IDENTIFIED IN ONE LYMPH NODE (0/1) 3. Lymph node, sentinel, biopsy, left axillary #2 - NO CARCINOMA IDENTIFIED IN ONE LYMPH NODE (0/1) 4. Lymph node, sentinel, biopsy, left axillary #3 - NO CARCINOMA IDENTIFIED IN ONE LYMPH NODE (0/1)    Receptor Status: ER(90%), PR (90%), Her2-neu (negative), Ki-(10%)  Did patient present with symptoms (if so, please note symptoms) or was this found on screening mammography?: She had routine screening mammography on 07/12/2018 showing a possible abnormality in the left breast. She underwent bilateral diagnostic mammography with tomography and left breast ultrasonography on 07/19/2018 showing: 0.6 cm irregular mass with associated architectural distortion in the 9:30 position of the left breast 4 cm from the nipple.   Past/Anticipated interventions by surgeon, if any:  08/31/18 Procedure:                 Inject blue dye left breast                                      Left breast lumpectomy with double bracketed radioactive seed localization                                      Left axillary deep sentinel lymph node biopsy  Surgeon:                     Edsel Petrin. Dalbert Batman, M.D., FACS  Past/Anticipated interventions by medical oncology, if any: Chemotherapy Per Dr. Burr Medico: PLAN: -She will have genetic testing  -Breast MRI will be scheduled -She will  have surgery with Dr. Dalbert Batman and I will see her after her radiation  -no need Oncotype unless tumor >1cm or Grade 2-3 with tumor >52m on surgical path    Lymphedema issues, if any: No  Pain issues, if any:  Pt reports BREAST pain, rare shooting pain in breast, which is resolving the farther out from surgery she gets. Pt reports area above axilla "feels raw" and describes the pain as "tender burning raw".   SAFETY ISSUES:  Prior radiation? No  Pacemaker/ICD? No  Possible current pregnancy? No, Pt is peri-menopausal  Is the patient on methotrexate? No  Current Complaints / other details:  Pt presents today for consult with Dr. KSondra Comefor Radiation Oncology. Pt is unaccompanied. Pt scored 4/10 on initial distress screening. SW consult offered to pt, pt declined.     JLoma Sousa RN 09/17/2018,2:17 PM

## 2018-09-17 ENCOUNTER — Encounter: Payer: Self-pay | Admitting: Radiation Oncology

## 2018-09-17 ENCOUNTER — Ambulatory Visit
Admission: RE | Admit: 2018-09-17 | Discharge: 2018-09-17 | Disposition: A | Discharge status: Home / Self Care | Payer: BC Managed Care – PPO | Source: Ambulatory Visit | Attending: Radiation Oncology | Admitting: Radiation Oncology

## 2018-09-17 ENCOUNTER — Ambulatory Visit: Payer: BC Managed Care – PPO | Admitting: Radiation Oncology

## 2018-09-17 ENCOUNTER — Other Ambulatory Visit: Payer: Self-pay

## 2018-09-17 VITALS — BP 121/75 | HR 66 | Temp 98.3°F | Resp 18 | Ht 64.0 in | Wt 160.0 lb

## 2018-09-17 DIAGNOSIS — C50212 Malignant neoplasm of upper-inner quadrant of left female breast: Secondary | ICD-10-CM | POA: Diagnosis present

## 2018-09-17 DIAGNOSIS — Z510 Encounter for antineoplastic radiation therapy: Secondary | ICD-10-CM | POA: Diagnosis not present

## 2018-09-17 DIAGNOSIS — Z170 Estrogen receptor positive status [ER+]: Secondary | ICD-10-CM | POA: Insufficient documentation

## 2018-09-17 NOTE — Progress Notes (Addendum)
Radiation Oncology         (336) [phone] ________________________________  Name: Dawn Rogers MRN: [id number]  Date: 09/17/2018  DOB: October 12, 1961  Reevaluation Visit Note  CC: Dione Housekeeper, MD  Truitt Merle, MD    ICD-10-CM   1. 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  she is doing well overall.    Since they were last seen in the office, she had on 08/31/18 a lumpectomy and sentinel node biopsies of her left side. Breast, lumpectomy, left showed invasive ductal carcinoma, Nottingham grade 1 of 3, 0.7 cm, as well as calcifications associated with carcinoma, margins uninvolved by carcinoma (0.5 cm; posterior margin), columnar cell and fibrocystic changes including apocrine metaplasia, psuedoangiomatous stromal hyperplasia, and previous bx site changes present. Her lymph node, sentinel, biopsy, left axillary #1 showed no carcinoma identified in one lymph node (0/1). Her lymph node, sentinel, biopsy, left axillary #2 showed no carcinoma identified in one lymph node (0/1). Her lymph node, sentinel, biopsy, left axillary #3 also showed no carcinoma identified in one lymph node (0/1).  On 08/20/18, she had Breast, left, needle core biopsy, medial which showed fibrocystic changes. The lateral left breast biopsy also showed fibrocystic changes.      On 08/06/18, she had a bilateral MRI of the breasts with and without contrast, which showed biopsy-proven left breast 9:30 o'clock malignancy measures 6 x 6 x 31m, additional 2.4 cm area of linear non mass enhancement in the left 9 o'clock breast, middle depth, indeterminate, as well as 4 mm enhancing nodule in the subareolar left breast, anterior depth,  indeterminate. There was no MRI evidence of lymphadenopathy  On review of systems, she reports numbness and altered sensation in her axilla region. She recently switched to natural charcoal deodorant.  she is otherwise healthy and denies any other symptoms.                  ALLERGIES:  has No Known Allergies.  Meds: Current Outpatient Medications  Medication Sig Dispense Refill  . cyclobenzaprine (FLEXERIL) 5 MG tablet Take 5 mg by mouth 3 (three) times daily as needed for muscle spasms.    . ergocalciferol (VITAMIN D2) 50000 units capsule Take 50,000 Units by mouth once a week.    .Marland KitchenFLUoxetine (PROZAC) 10 MG capsule Take 30 mg by mouth daily.    .Marland KitchenHYDROcodone-acetaminophen (NORCO) 5-325 MG tablet Take 1-2 tablets by mouth every 6 (six) hours as needed for moderate pain or severe pain. 30 tablet 0  . Ibuprofen-Famotidine (DUEXIS) 800-26.6 MG TABS Take by mouth.    . levothyroxine (SYNTHROID, LEVOTHROID) 25 MCG tablet Take 25 mcg by mouth daily before breakfast.    . LORazepam (ATIVAN) 0.5 MG tablet Take 0.5 mg by mouth every 6 (six) hours as needed for anxiety.    .Marland Kitchenomeprazole (PRILOSEC) 20 MG capsule Take 20 mg by mouth daily.     No current facility-administered medications for this encounter.     Physical Findings: The patient is in no acute distress. Patient is alert and oriented.  height is 5' 4"  (1.626 m) and weight is 160 lb (72.6 kg). Her oral temperature is 98.3 F (36.8 C). Her blood pressure  is 121/75 and her pulse is 66. Her respiration is 18 and oxygen saturation is 98%. .  No significant changes. Lungs are clear to auscultation bilaterally. Heart has regular rate and rhythm. No palpable cervical, supraclavicular, or axillary adenopathy. Abdomen soft, non-tender, normal bowel sounds. Right breast, no palpable mass, nipple discharge or bleeding. Patient does have fibrocystic changes in the central aspect of the right breast. Left breast, patient has a scar in the UIQ which is  healing well, but there is some erythema superior to the scar, question cellulitis. Patient also has a scar in the axillary region from her sentinel node procedure.   Lab Findings: Lab Results  Component Value Date   WBC 6.9 08/01/2018   HGB 13.3 08/01/2018   HCT 39.6 08/01/2018   MCV 96.1 08/01/2018   PLT 255 08/01/2018    Radiographic Findings: Nm Sentinel Node Inj-no Rpt (breast)  Result Date: 08/31/2018 Sulfur colloid was injected by the nuclear medicine technologist for melanoma sentinel node.   Mm Breast Surgical Specimen  Result Date: 08/31/2018 CLINICAL DATA:  Patient status post left breast lumpectomy. EXAM: SPECIMEN RADIOGRAPH OF THE LEFT BREAST COMPARISON:  Previous exam(s). FINDINGS: Status post excision of the left breast. The radioactive seeds and biopsy marker clips are present, completely intact, and were marked for pathology. IMPRESSION: Specimen radiograph of the left breast. Electronically Signed   By: Lovey Newcomer M.D.   On: 08/31/2018 13:53   Mm Lt Radioactive Seed Loc Mammo Guide  Result Date: 08/30/2018 CLINICAL DATA:  Patient presents for radioactive seed localization a left breast carcinoma and 2 areas of abnormal MRI enhancement, previously biopsied yielding fibrocystic changes which felt to be discordant with excision recommended. The 2 areas of abnormal MRI enhancement or localized with a cylinder and dumbbell shaped clip with both clips lying close approximation. The dumbbell shaped clip had migrated 0.6 cm lateral to the biopsy cavity. EXAM: MAMMOGRAPHIC GUIDED RADIOACTIVE SEED LOCALIZATION OF THE LEFT BREAST COMPARISON:  Previous exam(s). FINDINGS: Patient presents for radioactive seed localization prior to surgical excision. I met with the patient and we discussed the procedure of seed localization including benefits and alternatives. We discussed the high likelihood of a successful procedure. We discussed the risks of the procedure including infection,  bleeding, tissue injury and further surgery. We discussed the low dose of radioactivity involved in the procedure. Informed, written consent was given. The usual time-out protocol was performed immediately prior to the procedure. Ribbon clip: Invasive ductal carcinoma. Using mammographic guidance, sterile technique, 1% lidocaine and an I-125 radioactive seed, the ribbon shaped biopsy clip was localized using a medial approach. The follow-up mammogram images confirm the seed in the expected location and were marked for Dr. Dalbert Batman. Follow-up survey of the patient confirms presence of the radioactive seed. Order number of I-125 seed:  [id number]. Total activity:  9.450 millicuries reference Date: 07/20/2018 Dumbell and Cylinder clips: Discordant fibrocystic change. Using mammographic guidance, sterile technique, 1% lidocaine and an I-125 radioactive seed, the cylinder shaped clip and the adjacent dumbbell shaped clip were localized using a medial approach. The follow-up mammogram images confirm the seed in the expected location and were marked for Dr. Dalbert Batman. Follow-up survey of the patient confirms presence of the radioactive seed. Order number of I-125 seed:  [id number]. Total activity:  4.917 millicuries reference Date: 07/20/2018 The patient tolerated the procedure well and was released from the [facility]. She was given instructions regarding seed removal. IMPRESSION: Radioactive seed localization of the left breast. Two seeds placed.  No apparent complications. Electronically Signed   By: Lajean Manes M.D.   On: 08/30/2018 14:24   Mm Lt Rad Seed Ea Add Lesion Loc Mammo  Result Date: 08/30/2018 CLINICAL DATA:  Patient presents for radioactive seed localization a left breast carcinoma and 2 areas of abnormal MRI enhancement, previously biopsied yielding fibrocystic changes which felt to be discordant with excision recommended. The 2 areas of abnormal MRI enhancement or localized with a cylinder and dumbbell  shaped clip with both clips lying close approximation. The dumbbell shaped clip had migrated 0.6 cm lateral to the biopsy cavity. EXAM: MAMMOGRAPHIC GUIDED RADIOACTIVE SEED LOCALIZATION OF THE LEFT BREAST COMPARISON:  Previous exam(s). FINDINGS: Patient presents for radioactive seed localization prior to surgical excision. I met with the patient and we discussed the procedure of seed localization including benefits and alternatives. We discussed the high likelihood of a successful procedure. We discussed the risks of the procedure including infection, bleeding, tissue injury and further surgery. We discussed the low dose of radioactivity involved in the procedure. Informed, written consent was given. The usual time-out protocol was performed immediately prior to the procedure. Ribbon clip: Invasive ductal carcinoma. Using mammographic guidance, sterile technique, 1% lidocaine and an I-125 radioactive seed, the ribbon shaped biopsy clip was localized using a medial approach. The follow-up mammogram images confirm the seed in the expected location and were marked for Dr. Dalbert Batman. Follow-up survey of the patient confirms presence of the radioactive seed. Order number of I-125 seed:  [id number]. Total activity:  9.480 millicuries reference Date: 07/20/2018 Dumbell and Cylinder clips: Discordant fibrocystic change. Using mammographic guidance, sterile technique, 1% lidocaine and an I-125 radioactive seed, the cylinder shaped clip and the adjacent dumbbell shaped clip were localized using a medial approach. The follow-up mammogram images confirm the seed in the expected location and were marked for Dr. Dalbert Batman. Follow-up survey of the patient confirms presence of the radioactive seed. Order number of I-125 seed:  [id number]. Total activity:  7.078 millicuries reference Date: 07/20/2018 The patient tolerated the procedure well and was released from the [facility]. She was given instructions regarding seed removal.  IMPRESSION: Radioactive seed localization of the left breast. Two seeds placed. No apparent complications. Electronically Signed   By: Lajean Manes M.D.   On: 08/30/2018 14:24   Mm Clip Placement Left  Result Date: 08/20/2018 CLINICAL DATA:  Biopsy proven left breast cancer. Recent MRI showed enhancing 4 mm nodule in the subareolar region of the left breast and non masslike enhancement in the medial aspect of the left breast. EXAM: DIAGNOSTIC LEFT MAMMOGRAM POST MRI BIOPSIES COMPARISON:  Previous exam(s). FINDINGS: Mammographic images were obtained following MR guided biopsy of the left breast. Mammographic images show there is a ribbon shaped clip in the area of the previously biopsied invasive ductal carcinoma. MR guided biopsy of non masslike enhancement in the medial aspect of the left breast was performed. There is a barbell shaped clip that migrated approximately 1.6 cm lateral to the biopsied site. The biopsied area is located 1 cm from the ribbon shaped clip and the barbell shaped clip is located 2.4 cm from the ribbon shaped clip. In addition there is a cylindrical shaped clip in the central aspect of the left clip where the 4 mm nodule was biopsied. The cylindrical shaped clip is located 2.9 cm from the ribbon shaped clip. IMPRESSION: Status post MR guided core biopsies of the left breast with pathology pending. Final Assessment: Post Procedure Mammograms for Marker Placement Electronically Signed  By: Lillia Mountain M.D.   On: 08/20/2018 10:54   Mr Aundra Millet Breast Bx Johnella Moloney Dev 1st Lesion Image Bx Spec Mr Guide  Addendum Date: 08/22/2018   ADDENDUM REPORT: 08/22/2018 14:24 ADDENDUM: Pathology revealed FIBROCYSTIC CHANGES of the Left breast, both locations, medial and lateral. This was found to be discordant by Dr. Lillia Mountain, with wide excision recommended. Recommend wide excision including medial and anterior breast tissue where the non masslike enhancement was visualized. Pathology results were  discussed with the patient by telephone. The patient reported doing well after the biopsies with tenderness at the sites. Post biopsy instructions and care were reviewed and questions were answered. The patient was encouraged to call [facility] for any additional concerns. The patient has a recent diagnosis of left breast cancer and should follow her outlined treatment plan. Pathology results reported by Terie Purser, RN on 08/22/2018. Electronically Signed   By: Lillia Mountain M.D.   On: 08/22/2018 14:24   Result Date: 08/22/2018 CLINICAL DATA:  Biopsy proven malignancy in the 9:30 region of the left breast. MRI showed nonlinear masslike enhancement in the medial aspect of the left breast and a 4 mm nodule in the subareolar aspect of the left breast. MR guided core biopsies recommended. EXAM: MRI GUIDED CORE NEEDLE BIOPSY OF THE LEFT BREAST TECHNIQUE: Multiplanar, multisequence MR imaging of the left breast was performed both before and after administration of intravenous contrast. CONTRAST:  80m MULTIHANCE GADOBENATE DIMEGLUMINE 529 MG/ML IV SOLN COMPARISON:  Previous exams. FINDINGS: I met with the patient, and we discussed the procedure of MRI guided biopsy, including risks, benefits, and alternatives. Specifically, we discussed the risks of infection, bleeding, tissue injury, clip migration, and inadequate sampling. Informed, written consent was given. The usual time out protocol was performed immediately prior to the procedure. Using sterile technique, 1% Lidocaine, MRI guidance, and a 9 gauge vacuum assisted device, biopsy was performed of non masslike enhancement in the medial aspect of the left breast using a medial to lateral approach. At the conclusion of the procedure, a barbell tissue marker clip was deployed into the biopsy cavity. Follow-up 2-view mammogram was performed and dictated separately. Using sterile technique, 1% Lidocaine, MRI guidance, and a 9 gauge vacuum  assisted device, biopsy was performed of a nodule in the subareolar region of the left breast using a lateral to medial approach. At the conclusion of the procedure, a cylindrical shaped tissue marker clip was deployed into the biopsy cavity. Follow-up 2-view mammogram was performed and dictated separately. IMPRESSION: MRI guided biopsies of a nodule in the subareolar region the left breast (cylindrical shaped clip) and non masslike enhancement in the medial aspect of the left breast (dumbbell-shaped clip) no apparent complications. Electronically Signed: By: DLillia MountainM.D. On: 08/20/2018 10:49   Mr LAundra MilletBreast Bx W Loc Dev Ea Add Lesion Image Bx Spec Mr Guide  Addendum Date: 08/22/2018   ADDENDUM REPORT: 08/22/2018 14:24 ADDENDUM: Pathology revealed FIBROCYSTIC CHANGES of the Left breast, both locations, medial and lateral. This was found to be discordant by Dr. DLillia Mountain with wide excision recommended. Recommend wide excision including medial and anterior breast tissue where the non masslike enhancement was visualized. Pathology results were discussed with the patient by telephone. The patient reported doing well after the biopsies with tenderness at the sites. Post biopsy instructions and care were reviewed and questions were answered. [facility]  The patient has a recent diagnosis of left breast cancer and should follow her outlined treatment plan. Pathology results reported by Terie Purser, RN on 08/22/2018. Electronically Signed   By: Lillia Mountain M.D.   On: 08/22/2018 14:24   Result Date: 08/22/2018 CLINICAL DATA:  Biopsy proven malignancy in the 9:30 region of the left breast. MRI showed nonlinear masslike enhancement in the medial aspect of the left breast and a 4 mm nodule in the subareolar aspect of the left breast. MR guided core biopsies recommended. EXAM: MRI GUIDED CORE NEEDLE BIOPSY OF THE LEFT BREAST  TECHNIQUE: Multiplanar, multisequence MR imaging of the left breast was performed both before and after administration of intravenous contrast. CONTRAST:  70m MULTIHANCE GADOBENATE DIMEGLUMINE 529 MG/ML IV SOLN COMPARISON:  Previous exams. FINDINGS: I met with the patient, and we discussed the procedure of MRI guided biopsy, including risks, benefits, and alternatives. Specifically, we discussed the risks of infection, bleeding, tissue injury, clip migration, and inadequate sampling. Informed, written consent was given. The usual time out protocol was performed immediately prior to the procedure. Using sterile technique, 1% Lidocaine, MRI guidance, and a 9 gauge vacuum assisted device, biopsy was performed of non masslike enhancement in the medial aspect of the left breast using a medial to lateral approach. At the conclusion of the procedure, a barbell tissue marker clip was deployed into the biopsy cavity. Follow-up 2-view mammogram was performed and dictated separately. Using sterile technique, 1% Lidocaine, MRI guidance, and a 9 gauge vacuum assisted device, biopsy was performed of a nodule in the subareolar region of the left breast using a lateral to medial approach. At the conclusion of the procedure, a cylindrical shaped tissue marker clip was deployed into the biopsy cavity. Follow-up 2-view mammogram was performed and dictated separately. IMPRESSION: MRI guided biopsies of a nodule in the subareolar region the left breast (cylindrical shaped clip) and non masslike enhancement in the medial aspect of the left breast (dumbbell-shaped clip) no apparent complications. Electronically Signed: By: DLillia MountainM.D. 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  Per Dr. FErnestina Pennanote September 25 no indication for Oncotype with her tumor being less than a centimeter and grade 1.  Patient would be a good  candidate for breast conservation with radiation therapy directed at the left breast. Today, I talked to the patient about the findings and work-up thus far.  We discussed the natural history of breast cancer and general treatment, highlighting the role of radiotherapy in the management.  We discussed the available radiation techniques, and focused on the details of logistics and delivery.  We reviewed the anticipated acute and late sequelae associated with radiation in this setting.  The patient was encouraged to ask questions that I answered to the best of my ability.  A patient consent form was discussed and signed.  We retained a copy for our records.  The patient would like to proceed with radiation and will be scheduled for CT simulation.  The patient may have early cellulitis in the left breast, she is scheduled to see Dr. IDalbert Batman her surgeon, tomorrow for post-operative follow-up.    Plan:  CT simulation November 19th at 10:00 AM, with treatments likely to start after Thanksgiving.The patient would appear to be a good candidate for hypo-fractionated accelerated radiation therapy.  ____________________________________   JBlair Promise PhD, MD    This document serves as  a record of services personally performed by Gery Pray, MD. It was created on his behalf by Mary-Margaret Loma Messing, a trained medical scribe. The creation of this record is based on the scribe's personal observations and the provider's statements to them. This document has been checked and approved by the attending provider.

## 2018-09-25 ENCOUNTER — Ambulatory Visit
Admission: RE | Admit: 2018-09-25 | Discharge: 2018-09-25 | Disposition: A | Discharge status: Home / Self Care | Payer: BC Managed Care – PPO | Source: Ambulatory Visit | Attending: Radiation Oncology | Admitting: Radiation Oncology

## 2018-09-25 DIAGNOSIS — Z510 Encounter for antineoplastic radiation therapy: Secondary | ICD-10-CM | POA: Diagnosis not present

## 2018-09-25 DIAGNOSIS — Z170 Estrogen receptor positive status [ER+]: Principal | ICD-10-CM

## 2018-09-25 DIAGNOSIS — C50212 Malignant neoplasm of upper-inner quadrant of left female breast: Secondary | ICD-10-CM

## 2018-09-25 NOTE — Progress Notes (Signed)
  Radiation Oncology         (336) [phone] ________________________________  Name: Dawn Rogers MRN: [id number]  Date: 09/25/2018  DOB: 07-Feb-1961  SIMULATION AND TREATMENT PLANNING NOTE    ICD-10-CM   1. 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  Identity was confirmed.  All relevant records and images related to the planned course of therapy were reviewed.  The patient freely provided informed written consent to proceed with treatment after reviewing the details related to the planned course of therapy. The consent form was witnessed and verified by the simulation staff.  Then, the patient was set-up in a stable reproducible  supine position for radiation therapy.  CT images were obtained.  Surface markings were placed.  The CT images were loaded into the planning software.  Then the target and avoidance structures were contoured.  Treatment planning then occurred.  The radiation prescription was entered and confirmed.  Then, I designed and supervised the construction of a total of 3 medically necessary complex treatment devices.  I have requested : 3D Simulation  I have requested a DVH of the following structures: heart, lungs, lumpectomy cavity.  I have ordered:CBC  PLAN:  The patient will receive 40.05 Gy in 15 fractions followed by a boost to the lumpectomy cavity of 10 gray in 5 fractions.   Optical Surface Tracking Plan:  Since intensity modulated radiotherapy (IMRT) and 3D conformal radiation treatment methods are predicated on accurate and precise positioning for treatment, intrafraction motion monitoring is medically necessary to ensure accurate and safe treatment  delivery.  The ability to quantify intrafraction motion without excessive ionizing radiation dose can only be performed with optical surface tracking. Accordingly, surface imaging offers the opportunity to obtain 3D measurements of patient position throughout IMRT and 3D treatments without excessive radiation exposure.  I am ordering optical surface tracking for this patient's upcoming course of radiotherapy. ________________________________  Special treatment procedure was performed today due to the extra time and effort required by myself to plan and prepare this patient for deep inspiration breath hold technique.  I have determined cardiac sparing to be of benefit to this patient to prevent long term cardiac damage due to radiation of the heart.  Bellows were placed on the patient's abdomen. To facilitate cardiac sparing, the patient was coached by the radiation therapists on breath hold techniques and breathing practice was performed. Practice waveforms were obtained. The patient was then scanned while maintaining breath hold in the treatment position.  This image was then transferred over to the imaging specialist. The imaging specialist then created a fusion of the free breathing and breath hold scans using the chest wall as the stable structure. I personally reviewed the fusion in axial, coronal and sagittal image planes.  Excellent cardiac sparing was obtained.  I felt the patient is an appropriate candidate for breath hold and the patient will be treated as such.  The image fusion was then reviewed with the patient to reinforce the necessity of reproducible breath hold.    -----------------------------------  Blair Promise, PhD, MD

## 2018-09-26 ENCOUNTER — Telehealth: Payer: Self-pay | Admitting: Hematology

## 2018-09-26 NOTE — Telephone Encounter (Signed)
Called regarding 12/27

## 2018-10-01 ENCOUNTER — Ambulatory Visit: Payer: BC Managed Care – PPO | Admitting: Radiation Oncology

## 2018-10-01 DIAGNOSIS — Z510 Encounter for antineoplastic radiation therapy: Secondary | ICD-10-CM | POA: Diagnosis not present

## 2018-10-02 ENCOUNTER — Ambulatory Visit: Payer: BC Managed Care – PPO

## 2018-10-03 ENCOUNTER — Telehealth: Payer: Self-pay

## 2018-10-03 ENCOUNTER — Ambulatory Visit: Payer: BC Managed Care – PPO

## 2018-10-03 NOTE — Telephone Encounter (Signed)
Received VM from pt that surgeon, Dr. Dalbert Batman, would like to delay start of radiation for another 3 weeks due to concerns with breast. Left VM with this RN's direct number for pt conveying that once cleared by surgeon, pt would be rescheduled. Loma Sousa, RN BSN

## 2018-10-08 ENCOUNTER — Ambulatory Visit: Payer: BC Managed Care – PPO | Admitting: Radiation Oncology

## 2018-10-08 ENCOUNTER — Ambulatory Visit: Payer: BC Managed Care – PPO

## 2018-10-09 ENCOUNTER — Ambulatory Visit: Payer: BC Managed Care – PPO

## 2018-10-10 ENCOUNTER — Ambulatory Visit: Payer: BC Managed Care – PPO

## 2018-10-10 ENCOUNTER — Telehealth: Payer: Self-pay | Admitting: Hematology

## 2018-10-10 NOTE — Telephone Encounter (Signed)
Scheduled appt per 12/4 [facility] message - sent reminder letter in the mail with appt date and time

## 2018-10-11 ENCOUNTER — Ambulatory Visit: Payer: BC Managed Care – PPO

## 2018-10-12 ENCOUNTER — Ambulatory Visit: Payer: BC Managed Care – PPO

## 2018-10-15 ENCOUNTER — Ambulatory Visit: Payer: BC Managed Care – PPO

## 2018-10-16 ENCOUNTER — Ambulatory Visit: Payer: BC Managed Care – PPO

## 2018-10-17 ENCOUNTER — Ambulatory Visit: Payer: BC Managed Care – PPO

## 2018-10-18 ENCOUNTER — Ambulatory Visit: Payer: BC Managed Care – PPO

## 2018-10-19 ENCOUNTER — Ambulatory Visit: Payer: BC Managed Care – PPO

## 2018-10-22 ENCOUNTER — Telehealth: Payer: Self-pay | Admitting: Hematology

## 2018-10-22 ENCOUNTER — Ambulatory Visit: Payer: BC Managed Care – PPO

## 2018-10-22 NOTE — Telephone Encounter (Signed)
patient called about MD seeing her at the end of radiation

## 2018-10-23 ENCOUNTER — Ambulatory Visit: Payer: BC Managed Care – PPO

## 2018-10-23 ENCOUNTER — Ambulatory Visit: Payer: BC Managed Care – PPO | Admitting: Radiation Oncology

## 2018-10-24 ENCOUNTER — Ambulatory Visit: Payer: BC Managed Care – PPO

## 2018-10-25 ENCOUNTER — Ambulatory Visit: Payer: BC Managed Care – PPO

## 2018-10-26 ENCOUNTER — Ambulatory Visit: Payer: BC Managed Care – PPO

## 2018-10-29 ENCOUNTER — Ambulatory Visit
Admission: RE | Admit: 2018-10-29 | Discharge: 2018-10-29 | Disposition: A | Discharge status: Home / Self Care | Payer: BC Managed Care – PPO | Source: Ambulatory Visit | Attending: Radiation Oncology | Admitting: Radiation Oncology

## 2018-10-29 DIAGNOSIS — Z170 Estrogen receptor positive status [ER+]: Secondary | ICD-10-CM | POA: Diagnosis not present

## 2018-10-29 DIAGNOSIS — C50212 Malignant neoplasm of upper-inner quadrant of left female breast: Secondary | ICD-10-CM | POA: Insufficient documentation

## 2018-10-29 DIAGNOSIS — Z510 Encounter for antineoplastic radiation therapy: Secondary | ICD-10-CM | POA: Diagnosis not present

## 2018-10-30 ENCOUNTER — Ambulatory Visit: Payer: BC Managed Care – PPO

## 2018-10-30 ENCOUNTER — Ambulatory Visit
Admission: RE | Admit: 2018-10-30 | Discharge: 2018-10-30 | Disposition: A | Discharge status: Home / Self Care | Payer: BC Managed Care – PPO | Source: Ambulatory Visit | Attending: Radiation Oncology | Admitting: Radiation Oncology

## 2018-10-30 DIAGNOSIS — Z510 Encounter for antineoplastic radiation therapy: Secondary | ICD-10-CM | POA: Diagnosis not present

## 2018-11-01 ENCOUNTER — Ambulatory Visit
Admission: RE | Admit: 2018-11-01 | Discharge: 2018-11-01 | Disposition: A | Discharge status: Home / Self Care | Payer: BC Managed Care – PPO | Source: Ambulatory Visit | Attending: Radiation Oncology | Admitting: Radiation Oncology

## 2018-11-01 DIAGNOSIS — Z510 Encounter for antineoplastic radiation therapy: Secondary | ICD-10-CM | POA: Diagnosis not present

## 2018-11-02 ENCOUNTER — Ambulatory Visit: Payer: BC Managed Care – PPO | Admitting: Hematology

## 2018-11-02 ENCOUNTER — Ambulatory Visit
Admission: RE | Admit: 2018-11-02 | Discharge: 2018-11-02 | Disposition: A | Discharge status: Home / Self Care | Payer: BC Managed Care – PPO | Source: Ambulatory Visit | Attending: Radiation Oncology | Admitting: Radiation Oncology

## 2018-11-02 DIAGNOSIS — Z510 Encounter for antineoplastic radiation therapy: Secondary | ICD-10-CM | POA: Diagnosis not present

## 2018-11-05 ENCOUNTER — Ambulatory Visit
Admission: RE | Admit: 2018-11-05 | Discharge: 2018-11-05 | Disposition: A | Discharge status: Home / Self Care | Payer: BC Managed Care – PPO | Source: Ambulatory Visit | Attending: Radiation Oncology | Admitting: Radiation Oncology

## 2018-11-05 DIAGNOSIS — Z510 Encounter for antineoplastic radiation therapy: Secondary | ICD-10-CM | POA: Diagnosis not present

## 2018-11-06 ENCOUNTER — Ambulatory Visit
Admission: RE | Admit: 2018-11-06 | Discharge: 2018-11-06 | Disposition: A | Discharge status: Home / Self Care | Payer: BC Managed Care – PPO | Source: Ambulatory Visit | Attending: Radiation Oncology | Admitting: Radiation Oncology

## 2018-11-06 ENCOUNTER — Ambulatory Visit: Payer: BC Managed Care – PPO

## 2018-11-06 DIAGNOSIS — Z170 Estrogen receptor positive status [ER+]: Principal | ICD-10-CM

## 2018-11-06 DIAGNOSIS — C50212 Malignant neoplasm of upper-inner quadrant of left female breast: Secondary | ICD-10-CM

## 2018-11-06 DIAGNOSIS — Z510 Encounter for antineoplastic radiation therapy: Secondary | ICD-10-CM | POA: Diagnosis not present

## 2018-11-06 MED ORDER — RADIAPLEXRX EX GEL
Freq: Once | CUTANEOUS | Status: AC
  Administered 2018-11-06: 17:00:00 via TOPICAL

## 2018-11-06 MED ORDER — ALRA NON-METALLIC DEODORANT (RAD-ONC)
1.0000 "application " | Freq: Once | TOPICAL | Status: AC
  Administered 2018-11-06: 1 via TOPICAL

## 2018-11-08 ENCOUNTER — Ambulatory Visit
Admission: RE | Admit: 2018-11-08 | Discharge: 2018-11-08 | Disposition: A | Discharge status: Home / Self Care | Payer: BC Managed Care – PPO | Source: Ambulatory Visit | Attending: Radiation Oncology | Admitting: Radiation Oncology

## 2018-11-08 DIAGNOSIS — Z170 Estrogen receptor positive status [ER+]: Secondary | ICD-10-CM | POA: Insufficient documentation

## 2018-11-08 DIAGNOSIS — Z510 Encounter for antineoplastic radiation therapy: Secondary | ICD-10-CM | POA: Insufficient documentation

## 2018-11-08 DIAGNOSIS — C50212 Malignant neoplasm of upper-inner quadrant of left female breast: Secondary | ICD-10-CM | POA: Diagnosis present

## 2018-11-09 ENCOUNTER — Ambulatory Visit
Admission: RE | Admit: 2018-11-09 | Discharge: 2018-11-09 | Disposition: A | Discharge status: Home / Self Care | Payer: BC Managed Care – PPO | Source: Ambulatory Visit | Attending: Radiation Oncology | Admitting: Radiation Oncology

## 2018-11-09 DIAGNOSIS — Z510 Encounter for antineoplastic radiation therapy: Secondary | ICD-10-CM | POA: Diagnosis not present

## 2018-11-11 ENCOUNTER — Ambulatory Visit: Payer: BC Managed Care – PPO

## 2018-11-12 ENCOUNTER — Ambulatory Visit
Admission: RE | Admit: 2018-11-12 | Discharge: 2018-11-12 | Disposition: A | Discharge status: Home / Self Care | Payer: BC Managed Care – PPO | Source: Ambulatory Visit | Attending: Radiation Oncology | Admitting: Radiation Oncology

## 2018-11-12 DIAGNOSIS — Z510 Encounter for antineoplastic radiation therapy: Secondary | ICD-10-CM | POA: Diagnosis not present

## 2018-11-13 ENCOUNTER — Ambulatory Visit
Admission: RE | Admit: 2018-11-13 | Discharge: 2018-11-13 | Disposition: A | Discharge status: Home / Self Care | Payer: BC Managed Care – PPO | Source: Ambulatory Visit | Attending: Radiation Oncology | Admitting: Radiation Oncology

## 2018-11-13 DIAGNOSIS — Z510 Encounter for antineoplastic radiation therapy: Secondary | ICD-10-CM | POA: Diagnosis not present

## 2018-11-14 ENCOUNTER — Ambulatory Visit
Admission: RE | Admit: 2018-11-14 | Discharge: 2018-11-14 | Disposition: A | Discharge status: Home / Self Care | Payer: BC Managed Care – PPO | Source: Ambulatory Visit | Attending: Radiation Oncology | Admitting: Radiation Oncology

## 2018-11-14 DIAGNOSIS — Z510 Encounter for antineoplastic radiation therapy: Secondary | ICD-10-CM | POA: Diagnosis not present

## 2018-11-15 ENCOUNTER — Ambulatory Visit
Admission: RE | Admit: 2018-11-15 | Discharge: 2018-11-15 | Disposition: A | Discharge status: Home / Self Care | Payer: BC Managed Care – PPO | Source: Ambulatory Visit | Attending: Radiation Oncology | Admitting: Radiation Oncology

## 2018-11-15 DIAGNOSIS — Z510 Encounter for antineoplastic radiation therapy: Secondary | ICD-10-CM | POA: Diagnosis not present

## 2018-11-16 ENCOUNTER — Ambulatory Visit
Admission: RE | Admit: 2018-11-16 | Discharge: 2018-11-16 | Disposition: A | Discharge status: Home / Self Care | Payer: BC Managed Care – PPO | Source: Ambulatory Visit | Attending: Radiation Oncology | Admitting: Radiation Oncology

## 2018-11-16 DIAGNOSIS — Z510 Encounter for antineoplastic radiation therapy: Secondary | ICD-10-CM | POA: Diagnosis not present

## 2018-11-19 ENCOUNTER — Ambulatory Visit
Admission: RE | Admit: 2018-11-19 | Discharge: 2018-11-19 | Disposition: A | Discharge status: Home / Self Care | Payer: BC Managed Care – PPO | Source: Ambulatory Visit | Attending: Radiation Oncology | Admitting: Radiation Oncology

## 2018-11-19 ENCOUNTER — Ambulatory Visit: Payer: BC Managed Care – PPO | Admitting: Radiation Oncology

## 2018-11-19 DIAGNOSIS — Z510 Encounter for antineoplastic radiation therapy: Secondary | ICD-10-CM | POA: Diagnosis not present

## 2018-11-20 ENCOUNTER — Ambulatory Visit: Payer: BC Managed Care – PPO | Admitting: Radiation Oncology

## 2018-11-20 ENCOUNTER — Ambulatory Visit
Admission: RE | Admit: 2018-11-20 | Discharge: 2018-11-20 | Disposition: A | Discharge status: Home / Self Care | Payer: BC Managed Care – PPO | Source: Ambulatory Visit | Attending: Radiation Oncology | Admitting: Radiation Oncology

## 2018-11-20 DIAGNOSIS — Z510 Encounter for antineoplastic radiation therapy: Secondary | ICD-10-CM | POA: Diagnosis not present

## 2018-11-21 ENCOUNTER — Ambulatory Visit: Payer: BC Managed Care – PPO | Admitting: Radiation Oncology

## 2018-11-21 ENCOUNTER — Ambulatory Visit: Payer: BC Managed Care – PPO

## 2018-11-21 DIAGNOSIS — Z510 Encounter for antineoplastic radiation therapy: Secondary | ICD-10-CM | POA: Diagnosis not present

## 2018-11-22 ENCOUNTER — Ambulatory Visit: Payer: BC Managed Care – PPO

## 2018-11-22 ENCOUNTER — Ambulatory Visit
Admission: RE | Admit: 2018-11-22 | Discharge: 2018-11-22 | Disposition: A | Discharge status: Home / Self Care | Payer: BC Managed Care – PPO | Source: Ambulatory Visit | Attending: Radiation Oncology | Admitting: Radiation Oncology

## 2018-11-22 ENCOUNTER — Ambulatory Visit: Payer: BC Managed Care – PPO | Admitting: Hematology

## 2018-11-22 DIAGNOSIS — Z170 Estrogen receptor positive status [ER+]: Principal | ICD-10-CM

## 2018-11-22 DIAGNOSIS — Z510 Encounter for antineoplastic radiation therapy: Secondary | ICD-10-CM | POA: Diagnosis not present

## 2018-11-22 DIAGNOSIS — C50212 Malignant neoplasm of upper-inner quadrant of left female breast: Secondary | ICD-10-CM

## 2018-11-22 NOTE — Progress Notes (Signed)
.  Simulation verification  The patient was brought to the treatment machine and placed in the plan treatment position.  Clinical set up was verified to ensure that the target region is appropriately covered for the patient's upcoming electron boost treatment.  The targeted volume of tissue is appropriately covered by the radiation field.  Based on my personal review, I approve the simulation verification.  The patient's treatment will proceed as planned.  ------------------------------------------------  -----------------------------------  Yarel Rushlow D. Laityn Bensen, PhD, MD  

## 2018-11-23 ENCOUNTER — Ambulatory Visit
Admission: RE | Admit: 2018-11-23 | Discharge: 2018-11-23 | Disposition: A | Discharge status: Home / Self Care | Payer: BC Managed Care – PPO | Source: Ambulatory Visit | Attending: Radiation Oncology | Admitting: Radiation Oncology

## 2018-11-23 DIAGNOSIS — Z510 Encounter for antineoplastic radiation therapy: Secondary | ICD-10-CM | POA: Diagnosis not present

## 2018-11-24 ENCOUNTER — Ambulatory Visit: Payer: BC Managed Care – PPO

## 2018-11-26 ENCOUNTER — Encounter: Payer: Self-pay | Admitting: Hematology

## 2018-11-26 ENCOUNTER — Inpatient Hospital Stay: Payer: BC Managed Care – PPO | Admitting: Hematology

## 2018-11-26 ENCOUNTER — Inpatient Hospital Stay: Payer: BC Managed Care – PPO

## 2018-11-26 ENCOUNTER — Telehealth: Payer: Self-pay | Admitting: Hematology

## 2018-11-26 ENCOUNTER — Ambulatory Visit
Admission: RE | Admit: 2018-11-26 | Discharge: 2018-11-26 | Disposition: A | Discharge status: Home / Self Care | Payer: BC Managed Care – PPO | Source: Ambulatory Visit | Attending: Radiation Oncology | Admitting: Radiation Oncology

## 2018-11-26 VITALS — BP 127/74 | HR 77 | Temp 97.5°F | Resp 18 | Ht 64.0 in | Wt 162.5 lb

## 2018-11-26 DIAGNOSIS — Z9221 Personal history of antineoplastic chemotherapy: Secondary | ICD-10-CM

## 2018-11-26 DIAGNOSIS — C50212 Malignant neoplasm of upper-inner quadrant of left female breast: Secondary | ICD-10-CM

## 2018-11-26 DIAGNOSIS — Z170 Estrogen receptor positive status [ER+]: Secondary | ICD-10-CM | POA: Insufficient documentation

## 2018-11-26 DIAGNOSIS — Z923 Personal history of irradiation: Secondary | ICD-10-CM

## 2018-11-26 DIAGNOSIS — Z807 Family history of other malignant neoplasms of lymphoid, hematopoietic and related tissues: Secondary | ICD-10-CM | POA: Insufficient documentation

## 2018-11-26 DIAGNOSIS — Z79810 Long term (current) use of selective estrogen receptor modulators (SERMs): Secondary | ICD-10-CM

## 2018-11-26 DIAGNOSIS — Z79899 Other long term (current) drug therapy: Secondary | ICD-10-CM

## 2018-11-26 DIAGNOSIS — Z803 Family history of malignant neoplasm of breast: Secondary | ICD-10-CM

## 2018-11-26 DIAGNOSIS — Z510 Encounter for antineoplastic radiation therapy: Secondary | ICD-10-CM | POA: Diagnosis not present

## 2018-11-26 DIAGNOSIS — Z8041 Family history of malignant neoplasm of ovary: Secondary | ICD-10-CM | POA: Insufficient documentation

## 2018-11-26 DIAGNOSIS — Z801 Family history of malignant neoplasm of trachea, bronchus and lung: Secondary | ICD-10-CM | POA: Insufficient documentation

## 2018-11-26 DIAGNOSIS — Z7982 Long term (current) use of aspirin: Secondary | ICD-10-CM

## 2018-11-26 DIAGNOSIS — E2839 Other primary ovarian failure: Secondary | ICD-10-CM

## 2018-11-26 NOTE — Progress Notes (Signed)
[facility]   Telephone:([phone] Fax:([phone]) [phone]   Clinic Follow up Note   Patient Care Team: Dione Housekeeper, MD as PCP - General (Family Medicine) Fanny Skates, MD as Consulting Physician (General Surgery) Truitt Merle, MD as Consulting Physician (Hematology) Gery Pray, MD as Consulting Physician (Radiation Oncology)  Date of Service:  11/26/2018  CHIEF COMPLAINT: F/u of left breast cancer  SUMMARY OF ONCOLOGIC HISTORY: Oncology History   Cancer Staging Malignant neoplasm of upper-inner quadrant of left breast in female, estrogen receptor positive ([place]) Staging form: Breast, AJCC 8th Edition - Clinical stage from 07/23/2018: Stage IA (cT1b, cN0, cM0, G1, ER+, PR+, HER2-) - Signed by Truitt Merle, MD on 08/01/2018 - Pathologic stage from 08/31/2018: Stage IA (pT1b, pN0, cM0, G1, ER+, PR+, HER2-) - Signed by Truitt Merle, MD on 11/26/2018       Malignant neoplasm of upper-inner quadrant of left breast in female, estrogen receptor positive ([place])   07/13/2018 Mammogram    07/13/2018 Screening Mammogram IMPRESSION: Further evaluation is suggested for possible distortion in the left breast    07/19/2018 Breast US     07/19/2018 Breast US IMPRESSION: 0.6 cm irregular mass with associated architectural distortion in the 9:30 position of the left breast 4 cm from the nipple. Findings are suspicious for malignancy.  Negative for left axillary lymphadenopathy    07/19/2018 Mammogram    07/19/2018 Diagnostic Mammogram IMPRESSION: 0.6 cm irregular mass with associated architectural distortion in the 9:30 position of the left breast 4 cm from the nipple. Findings are suspicious for malignancy.  Negative for left axillary lymphadenopathy.    07/23/2018 Cancer Staging    Staging form: Breast, AJCC 8th Edition - Clinical stage from 07/23/2018: Stage IA (cT1b, cN0, cM0, G1, ER+, PR+, HER2-) - Signed by Truitt Merle, MD on 08/01/2018    07/23/2018 Receptors her2   ADDITIONAL INFORMATION: PROGNOSTIC INDICATORS Results: IMMUNOHISTOCHEMICAL AND MORPHOMETRIC ANALYSIS PERFORMED MANUALLY The tumor cells are Negative for Her2 (1+). Estrogen Receptor: 90%, POSITIVE, STRONG STAINING INTENSITY Progesterone Receptor: 90%, POSITIVE, STRONG STAINING INTENSITY Proliferation Marker Ki67: 10%    07/23/2018 Pathology Results    Diagnosis Breast, left, needle core biopsy, 9:30 o'clock, 4 cm fn - INVASIVE DUCTAL CARCINOMA, GRADE I. SEE NOTE.    07/27/2018 Initial Diagnosis    Malignant neoplasm of upper-inner quadrant of left breast in female, estrogen receptor positive ([place])    08/16/2018 Genetic Testing    The Multi-Cancer Panel offered by [facility] includes sequencing and/or deletion duplication testing of the following 91 genes: AIP, ALK, APC, ATM, AXIN2, BAP1, BARD1, BLM, BMPR1A, BRCA1, BRCA2, BRIP1, BUB1B, CASR, CDC73, CDH1, CDK4, CDKN1B, CDKN1C, CDKN2A, CEBPA, CEP57, CHEK2, CTNNA1, DICER1, DIS3L2, EGFR, ENG, EPCAM, FH, FLCN, GALNT12, GATA2, GPC3, GREM1, HOXB13, HRAS, KIT, MAX, MEN1, MET, MITF, MLH1, MLH3, MSH2, MSH3, MSH6, MUTYH, NBN, NF1, NF2, NTHL1, PALB2, PDGFRA, PHOX2B, PMS2, POLD1, POLE, POT1, PRKAR1A, PTCH1, PTEN, RAD50, RAD51C, RAD51D, RB1, RECQL4, RET, RNF43, RPS20, RUNX1, SDHA, SDHAF2, SDHB, SDHC, SDHD, SMAD4, SMARCA4, SMARCB1, SMARCE1, STK11, SUFU, TERC, TERT, TMEM127, TP53, TSC1, TSC2, VHL, WRN, WT1  Results: Negative, no pathogenic variants identified.  The date of this test report is 08/16/2018.     08/31/2018 Surgery    LEFT DOUBLE BRACKETED LUMPECTOMY WITH RADIOACTIVE SEED X'S 2, LEFT SENTINEL LYMPH NODE BIOPSY, INJECT BLUE DYE LEFT BREAST by Dr. Dalbert Batman 08/31/18    08/31/2018 Pathology Results    Diagnosis 08/31/18  1. Breast, lumpectomy, left - INVASIVE DUCTAL CARCINOMA, [place] GRADE 1 OF 3, 0.7 CM -  CALCIFICATIONS ASSOCIATED WITH CARCINOMA - MARGINS UNINVOLVED BY CARCINOMA (0.5 CM; POSTERIOR MARGIN) - COLUMNAR CELL AND FIBROCYSTIC CHANGES  INCLUDING APOCRINE METAPLASIA - PSEUDOANGIOMATOUS STROMAL HYPERPLASIA - PREVIOUS BIOPSY SITE CHANGES PRESENT - SEE ONCOLOGY TABLE AND COMMENT BELOW 2. Lymph node, sentinel, biopsy, left axillary #1 - NO CARCINOMA IDENTIFIED IN ONE LYMPH NODE (0/1) 3. Lymph node, sentinel, biopsy, left axillary #2 - NO CARCINOMA IDENTIFIED IN ONE LYMPH NODE (0/1) 4. Lymph node, sentinel, biopsy, left axillary #3 - NO CARCINOMA IDENTIFIED IN ONE LYMPH NODE (0/1)    08/31/2018 Cancer Staging    Staging form: Breast, AJCC 8th Edition - Pathologic stage from 08/31/2018: Stage IA (pT1b, pN0, cM0, G1, ER+, PR+, HER2-) - Signed by Truitt Merle, MD on 11/26/2018    10/30/2018 -  Radiation Therapy    Adjuvant Radiation with Dr. Earney Hamburg 10/30/18-11/28/18      CURRENT THERAPY:  -Radiation with Dr. Earney Hamburg 10/30/18-11/28/18  INTERVAL HISTORY:  Dawn Rogers is here for a follow up of treatment. She was last seen by me 4 months ago. She presents to the clinic today by herself. She notes her radiation is going well overall. She is still able to do routine activities with no change in schedule. She does not pain from neck to arms from disc and facet degeneration in cervical spine. She has been seen by neurologist.   She notes her last period was of vaginal spotting was in 01/2018 and only has spotting in 09/2018. No further bleeding since then. She notes her vaginal dryness and low libido lately.      REVIEW OF SYSTEMS:   Constitutional: Denies fevers, chills or abnormal weight loss Eyes: Denies blurriness of vision Ears, nose, mouth, throat, and face: Denies mucositis or sore throat Respiratory: Denies cough, dyspnea or wheezes Cardiovascular: Denies palpitation, chest discomfort or lower extremity swelling Gastrointestinal:  Denies nausea, heartburn or change in bowel habits Skin: Denies abnormal skin rashes MSK: (+) Neck and b/l arm pain from cervical disc degeneration  Lymphatics: Denies new lymphadenopathy or  easy bruising Neurological:Denies numbness, tingling or new weaknesses Behavioral/Psych: Mood is stable, no new changes  All other systems were reviewed with the patient and are negative.  MEDICAL HISTORY:  Past Medical History:  Diagnosis Date  . Anxiety   . Cancer ([place]) 08/02/2018   left breast cancer  . Family history of breast cancer   . Family history of esophageal cancer   . Family history of lymphoma   . Family history of ovarian cancer   . GERD (gastroesophageal reflux disease)   . Hypothyroidism   . Neck pain     SURGICAL HISTORY: Past Surgical History:  Procedure Laterality Date  . ANKLE FRACTURE SURGERY Left   . BREAST LUMPECTOMY WITH RADIOACTIVE SEED AND SENTINEL LYMPH NODE BIOPSY Left 08/31/2018   Procedure: LEFT DOUBLE BRACKETED LUMPECTOMY WITH RADIOACTIVE SEED X'S 2, LEFT SENTINEL LYMPH NODE BIOPSY, INJECT BLUE DYE LEFT BREAST;  Surgeon: Fanny Skates, MD;  Location: [facility];  Service: General;  Laterality: Left;  . CHOLECYSTECTOMY    . TUBAL LIGATION      I have reviewed the social history and family history with the patient and they are unchanged from previous note.  ALLERGIES:  has No Known Allergies.  MEDICATIONS:  Current Outpatient Medications  Medication Sig Dispense Refill  . cyclobenzaprine (FLEXERIL) 5 MG tablet Take 5 mg by mouth 3 (three) times daily as needed for muscle spasms.    . ergocalciferol (VITAMIN D2) 50000 units  capsule Take 50,000 Units by mouth once a week.    Marland Kitchen FLUoxetine (PROZAC) 10 MG capsule Take 30 mg by mouth daily.    . Ibuprofen-Famotidine (DUEXIS) 800-26.6 MG TABS Take by mouth.    . levothyroxine (SYNTHROID, LEVOTHROID) 25 MCG tablet Take 25 mcg by mouth daily before breakfast.    . LORazepam (ATIVAN) 0.5 MG tablet Take 0.5 mg by mouth every 6 (six) hours as needed for anxiety.    Marland Kitchen omeprazole (PRILOSEC) 20 MG capsule Take 20 mg by mouth daily.     No current facility-administered medications for this  visit.     PHYSICAL EXAMINATION: ECOG PERFORMANCE STATUS: 1 - Symptomatic but completely ambulatory  Vitals:   11/26/18 1014  BP: 127/74  Pulse: 77  Resp: 18  Temp: (!) 97.5 F (36.4 C)  SpO2: 100%   Filed Weights   11/26/18 1014  Weight: 162 lb 8 oz (73.7 kg)    GENERAL:alert, no distress and comfortable SKIN: skin color, texture, turgor are normal, no rashes or significant lesions EYES: normal, Conjunctiva are pink and non-injected, sclera clear OROPHARYNX:no exudate, no erythema and lips, buccal mucosa, and tongue normal  NECK: supple, thyroid normal size, non-tender, without nodularity LYMPH:  no palpable lymphadenopathy in the cervical, axillary or inguinal LUNGS: clear to auscultation and percussion with normal breathing effort HEART: regular rate & rhythm and no murmurs and no lower extremity edema ABDOMEN:abdomen soft, non-tender and normal bowel sounds Musculoskeletal:no cyanosis of digits and no clubbing  NEURO: alert & oriented x 3 with fluent speech, no focal motor/sensory deficits BREAST: Mild skin erythema of left breast from radiation   LABORATORY DATA:  I have reviewed the data as listed CBC Latest Ref Rng & Units 08/01/2018  WBC 3.9 - 10.3 K/uL 6.9  Hemoglobin 11.6 - 15.9 g/dL 13.3  Hematocrit 34.8 - 46.6 % 39.6  Platelets 145 - 400 K/uL 255     CMP Latest Ref Rng & Units 08/01/2018  Glucose 70 - 99 mg/dL 90  BUN 6 - 20 mg/dL 10  Creatinine 0.44 - 1.00 mg/dL 0.83  Sodium 135 - 145 mmol/L 139  Potassium 3.5 - 5.1 mmol/L 4.3  Chloride 98 - 111 mmol/L 105  CO2 22 - 32 mmol/L 27  Calcium 8.9 - 10.3 mg/dL 8.9  Total Protein 6.5 - 8.1 g/dL 6.3(L)  Total Bilirubin 0.3 - 1.2 mg/dL 0.4  Alkaline Phos 38 - 126 U/L 62  AST 15 - 41 U/L 18  ALT 0 - 44 U/L 23      RADIOGRAPHIC STUDIES: I have personally reviewed the radiological images as listed and agreed with the findings in the report. No results found.   ASSESSMENT & PLAN:  Dawn Rogers is a 58  y.o. female with   1. Malignant Neoplasm of Upper-inner Quadrant of Left Breast, 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 Grade I -She was diagnosed in 07/2018. She is s/p left breast lumpectomy. I reviewed her surgical pathology with patient.  -Given her 0.7 cm tumor and low grade disease, I did not obtain Oncotype on her surgical sample due to the high possibility of low risk of disease -She is currently undergoing adjuvant radiation with Dr. Earney Hamburg to reduce her risk of local recurrence. She is tolerating moderately well. Plan to complete on 11/28/18.  -Given the strong ER and PR positivity, I do recommend adjuvant anti-estrogen therapy to reduce her risk of cancer recurrence.  -She may still be perimenopausal. Her  last spotting was in 08/2018. I will check her [place] levels to determine if she is truly postmenopausal. If not post-menopausal I will prescribe her Tamoxifen, otherwise she will proceed with Anastrozole.   -The potential benefit and side effects, which includes but not limited to, hot flash, skin and vaginal dryness, metabolic changes ( increased blood glucose, cholesterol, weight, etc.), slightly in increased risk of cardiovascular disease, cataracts, muscular and joint discomfort, osteopenia and osteoporosis, etc, were discussed with her in great details. She is interested, and we'll start after she completes radiation.  -With tamoxifen the potential side effects, which includes but not limited to, hot flash, skin and vaginal dryness, slightly increased risk of cardiovascular disease and cataract, small risk of thrombosis and endometrial cancer, were discussed with her in great details.  -I will also get a baseline DEXA  -I discussed her option of survivorship clinic to help her adjust to a new normal post treatment. I will schedule at next visit.  -F/u in 3 months    2. Bone Health  -I will obtain baseline bone density scan as Aromatase inhibitor can reduce her  bone density.   3. Cervical Spine degeneration  -with cervical neck pain  -She will continue to follow up with neurologist    PLAN: Lab today and DEXA scan at [facility] in a few week, I will call her with the above results to determine is she will start Tamoxifen or Anastrozole   Lab and f/u in 3 months   No problem-specific Assessment & Plan notes found for this encounter.   Orders Placed This Encounter  Procedures  . DG Bone Density    Standing Status:   Future    Standing Expiration Date:   11/26/2019    Order Specific Question:   Reason for Exam (SYMPTOM  OR DIAGNOSIS REQUIRED)    Answer:   screening    Order Specific Question:   Is the patient pregnant?    Answer:   No    Order Specific Question:   Preferred imaging location?    Answer:   [facility]  . Estradiol    Standing Status:   Future    Number of Occurrences:   1    Standing Expiration Date:   11/26/2019  . FSH-Follicle stimulating hormone    Standing Status:   Future    Number of Occurrences:   1    Standing Expiration Date:   11/26/2019   All questions were answered. The patient knows to call the clinic with any problems, questions or concerns. No barriers to learning was detected. I spent 20 minutes counseling the patient face to face. The total time spent in the appointment was 25 minutes and more than 50% was on counseling and review of test results     Truitt Merle, MD 11/26/2018   I, Joslyn Devon, am acting as scribe for Truitt Merle, MD.   I have reviewed the above documentation for accuracy and completeness, and I agree with the above.

## 2018-11-26 NOTE — Telephone Encounter (Signed)
Called patient and scheduled appts per 01/20 los.  Patient aware of appt.

## 2018-11-27 ENCOUNTER — Ambulatory Visit
Admission: RE | Admit: 2018-11-27 | Discharge: 2018-11-27 | Disposition: A | Discharge status: Home / Self Care | Payer: BC Managed Care – PPO | Source: Ambulatory Visit | Attending: Radiation Oncology | Admitting: Radiation Oncology

## 2018-11-27 ENCOUNTER — Ambulatory Visit: Payer: BC Managed Care – PPO

## 2018-11-27 DIAGNOSIS — Z510 Encounter for antineoplastic radiation therapy: Secondary | ICD-10-CM | POA: Diagnosis not present

## 2018-11-27 LAB — ESTRADIOL: Estradiol: 59.8 pg/mL

## 2018-11-27 LAB — FOLLICLE STIMULATING HORMONE: FSH: 27.8 m[IU]/mL

## 2018-11-28 ENCOUNTER — Ambulatory Visit
Admission: RE | Admit: 2018-11-28 | Discharge: 2018-11-28 | Disposition: A | Discharge status: Home / Self Care | Payer: BC Managed Care – PPO | Source: Ambulatory Visit | Attending: Radiation Oncology | Admitting: Radiation Oncology

## 2018-11-28 ENCOUNTER — Other Ambulatory Visit: Payer: Self-pay | Admitting: Hematology

## 2018-11-28 DIAGNOSIS — Z510 Encounter for antineoplastic radiation therapy: Secondary | ICD-10-CM | POA: Diagnosis not present

## 2018-11-28 MED ORDER — TAMOXIFEN CITRATE 20 MG PO TABS: 20.0000 mg | ORAL_TABLET | Freq: Every day | ORAL | 3 refills | Status: DC

## 2018-12-03 ENCOUNTER — Encounter: Payer: Self-pay | Admitting: Radiation Oncology

## 2018-12-03 NOTE — Progress Notes (Signed)
  Radiation Oncology         [phone] ________________________________  Name: Dawn Rogers MRN: [id number]  Date: 12/03/2018  DOB: [birth year]  End of Treatment Note  Diagnosis:   Malignant neoplasm of upper-inner quadrant of left breast, Stage IA (pT1b, pN0, cM0, G1, ER+, PR+, HER2-)      Indication for treatment:  Curative       Radiation treatment dates:   10/30/18-11/28/18  Site/dose:   1. Left breast; 40.05 Gy in 15 fractions of 2.67 Gy           2. lumpectomy Boost; 10 Gy in 5 fractions of 2 Gy  Beams/energy:  1. Photon 3D; 6X        2. Photon 3D; 12E, 15E  Narrative: The patient tolerated radiation treatment relatively well.     At the beginning of treatment, pt reported some stinging in her incision area. Pt denied radiation-related fatigue throughout treatments. Towards the end of treatment, pt reported swelling and occasional sharp-shooting pain in her breast. Pt developed hyperpigmentation and radiation dermatitis. She was prescribed Radiaplex and reported using it. Overall the pt was without complaints.   Plan: The patient has completed radiation treatment. The patient will return to radiation oncology clinic for routine followup in one month. I advised them to call or return sooner if they have any questions or concerns related to their recovery or treatment.  -----------------------------------  Blair Promise, PhD, MD  This document serves as a record of services personally performed by Gery Pray, MD. It was created on his behalf by Mary-Margaret Loma Messing, a trained medical scribe. The creation of this record is based on the scribe's personal observations and the provider's statements to them. This document has been checked and approved by the attending provider.

## 2019-01-07 ENCOUNTER — Encounter: Payer: Self-pay | Admitting: Radiation Oncology

## 2019-01-07 ENCOUNTER — Ambulatory Visit
Admission: RE | Admit: 2019-01-07 | Discharge: 2019-01-07 | Disposition: A | Discharge status: Home / Self Care | Payer: BC Managed Care – PPO | Source: Ambulatory Visit | Attending: Radiation Oncology | Admitting: Radiation Oncology

## 2019-01-07 ENCOUNTER — Other Ambulatory Visit: Payer: Self-pay

## 2019-01-07 VITALS — BP 130/79 | HR 69 | Temp 98.6°F | Resp 18 | Ht 64.0 in | Wt 161.1 lb

## 2019-01-07 DIAGNOSIS — Z79899 Other long term (current) drug therapy: Secondary | ICD-10-CM | POA: Insufficient documentation

## 2019-01-07 DIAGNOSIS — Z79810 Long term (current) use of selective estrogen receptor modulators (SERMs): Secondary | ICD-10-CM | POA: Diagnosis not present

## 2019-01-07 DIAGNOSIS — Z170 Estrogen receptor positive status [ER+]: Secondary | ICD-10-CM | POA: Diagnosis not present

## 2019-01-07 DIAGNOSIS — C50212 Malignant neoplasm of upper-inner quadrant of left female breast: Secondary | ICD-10-CM | POA: Diagnosis present

## 2019-01-07 NOTE — Progress Notes (Signed)
Pt presents today for f/u with Dr. Sondra Come. Pt is unaccompanied. Pt reports that she did not have much fatigue during radiation and has not noticed any lingering fatigue attributed to radiation. Pt denies c/o pain in breast, and states she has rare sharp, stinging pains. Pt occasionally uses "healing ointment" on breast but nothing routinely. Pt reports skin on breast has return to nearly baseline.   BP 130/79 (BP Location: Right Arm, Patient Position: Sitting)   Pulse 69   Temp 98.6 F (37 C) (Oral)   Resp 18   Ht 5\' 4"  (1.626 m)   Wt 161 lb 2 oz (73.1 kg)   SpO2 100%   BMI 27.66 kg/m   Wt Readings from Last 3 Encounters:  01/07/19 161 lb 2 oz (73.1 kg)  11/26/18 162 lb 8 oz (73.7 kg)  09/17/18 160 lb (72.6 kg)   Loma Sousa, RN BSN

## 2019-01-07 NOTE — Progress Notes (Signed)
Radiation Oncology         [phone] ________________________________  Name: Dawn Rogers MRN: [id number]  Date: 01/07/2019  DOB: 30-Apr-1961  Follow-Up Visit Note  CC: Dione Housekeeper, MD  Fanny Skates, MD    ICD-10-CM   1. Malignant neoplasm of upper-inner quadrant of left breast in female, estrogen receptor positive ([place]) C50.212    Z17.0     Diagnosis:   Malignant neoplasm of upper-inner quadrant of left breast, Stage IA (pT1b, pN0, cM0, G1, ER+, PR+, HER2-)    Interval Since Last Radiation:  1 months, 1 week  Radiation treatment dates:   10/30/18-11/28/18  Site/dose:   1. Left breast; 40.05 Gy in 15 fractions of 2.67 Gy                      2. lumpectomy Boost; 10 Gy in 5 fractions of 2 Gy  Narrative:  The patient returns today for routine follow-up.  she is doing well overall.  She started on Tamoxifen with Dr. Burr Medico approximately one month ago. She stays busy keeping her two 3.5 yo grandchildren.  On review of systems, she reports feeling back to normal. she denies itching, discomfort, fatigue and any other symptoms. She denies hot flashes/night sweats associated with Tamoxifen use. Pertinent positives are listed and detailed within the above HPI.                 ALLERGIES:  has No Known Allergies.  Meds: Current Outpatient Medications  Medication Sig Dispense Refill  . cyclobenzaprine (FLEXERIL) 5 MG tablet Take 5 mg by mouth 3 (three) times daily as needed for muscle spasms.    . ergocalciferol (VITAMIN D2) 50000 units capsule Take 50,000 Units by mouth once a week.    Marland Kitchen FLUoxetine (PROZAC) 10 MG capsule Take 30 mg by mouth daily.    . Ibuprofen-Famotidine (DUEXIS) 800-26.6 MG TABS Take by mouth.    . levothyroxine (SYNTHROID, LEVOTHROID) 25 MCG tablet Take 25 mcg by mouth daily before breakfast.    . LORazepam (ATIVAN) 0.5 MG tablet Take 0.5 mg by mouth every 6 (six) hours as needed for anxiety.    Marland Kitchen omeprazole (PRILOSEC) 20 MG capsule Take 20 mg by mouth  daily.    . tamoxifen (NOLVADEX) 20 MG tablet Take 1 tablet (20 mg total) by mouth daily. 30 tablet 3   No current facility-administered medications for this encounter.     Physical Findings: The patient is in no acute distress. Patient is alert and oriented.  height is _0  (1.626 m) and weight is 161 lb 2 oz (73.1 kg). Her oral temperature is 98.6 F (37 C). Her blood pressure is 130/79 and her pulse is 69. Her respiration is 18 and oxygen saturation is 100%. .  No significant changes. Lungs are clear to auscultation bilaterally. Heart has regular rate and rhythm. No palpable cervical, supraclavicular, or axillary adenopathy. Abdomen soft, non-tender, normal bowel sounds. Right breast with no palpable mass, nipple discharge, or bleeding. Left breast patient's skin has healed well. Mild hyperpigmentation changes and mild edema. No dominant mass, nipple discharge or bleeding.    Lab Findings: Lab Results  Component Value Date   WBC 6.9 08/01/2018   HGB 13.3 08/01/2018   HCT 39.6 08/01/2018   MCV 96.1 08/01/2018   PLT 255 08/01/2018    Radiographic Findings: No results found.  Impression:  No evidence of recurrence on clinical exam. Her skin has healed well. She did not have any  significant side effects with her treatment.   Plan:  PRN follow-up in radiation oncology. She will continue to follow-up closely with medical oncology and surgery and will continue on adjuvant hormonal therapy.   ____________________________________   Blair Promise, PhD, MD    This document serves as a record of services personally performed by Gery Pray, MD. It was created on his behalf by Mary-Margaret Loma Messing, a trained medical scribe. The creation of this record is based on the scribe's personal observations and the provider's statements to them. This document has been checked and approved by the attending provider.

## 2019-02-07 ENCOUNTER — Other Ambulatory Visit: Payer: BC Managed Care – PPO

## 2019-02-16 ENCOUNTER — Other Ambulatory Visit: Payer: Self-pay | Admitting: Hematology

## 2019-02-21 NOTE — Progress Notes (Signed)
[place]   [phone] Fax:([phone]) [phone]   Clinic Follow up Note   Patient Care Team: Dione Housekeeper, MD as PCP - General (Family Medicine) Fanny Skates, MD as Consulting Physician (General Surgery) Truitt Merle, MD as Consulting Physician (Hematology) Gery Pray, MD as Consulting Physician (Radiation Oncology)   I connected with Dawn Rogers on 02/25/2019 at  1:00 PM EDT by telephone visit and verified that I am speaking with the correct person using two identifiers.  I discussed the limitations, risks, security and privacy concerns of performing an evaluation and management service by telephone and the availability of in person appointments. I also discussed with the patient that there may be a patient responsible charge related to this service. The patient expressed understanding and agreed to proceed.   Patient's location:  Her home  Provider's location:  My Office  CHIEF COMPLAINT:  F/u of left breast cancer  SUMMARY OF ONCOLOGIC HISTORY: Oncology History   Cancer Staging Malignant neoplasm of upper-inner quadrant of left breast in female, estrogen receptor positive ([place]) Staging form: Breast, AJCC 8th Edition - Clinical stage from 07/23/2018: Stage IA (cT1b, cN0, cM0, G1, ER+, PR+, HER2-) - Signed by Truitt Merle, MD on 08/01/2018 - Pathologic stage from 08/31/2018: Stage IA (pT1b, pN0, cM0, G1, ER+, PR+, HER2-) - Signed by Truitt Merle, MD on 11/26/2018       Malignant neoplasm of upper-inner quadrant of left breast in female, estrogen receptor positive ([place])   07/13/2018 Mammogram    07/13/2018 Screening Mammogram IMPRESSION: Further evaluation is suggested for possible distortion in the left breast    07/19/2018 Breast US     07/19/2018 Breast US IMPRESSION: 0.6 cm irregular mass with associated architectural distortion in the 9:30 position of the left breast 4 cm from the nipple. Findings are suspicious for malignancy.  Negative for left  axillary lymphadenopathy    07/19/2018 Mammogram    07/19/2018 Diagnostic Mammogram IMPRESSION: 0.6 cm irregular mass with associated architectural distortion in the 9:30 position of the left breast 4 cm from the nipple. Findings are suspicious for malignancy.  Negative for left axillary lymphadenopathy.    07/23/2018 Cancer Staging    Staging form: Breast, AJCC 8th Edition - Clinical stage from 07/23/2018: Stage IA (cT1b, cN0, cM0, G1, ER+, PR+, HER2-) - Signed by Truitt Merle, MD on 08/01/2018    07/23/2018 Receptors her2    ADDITIONAL INFORMATION: PROGNOSTIC INDICATORS Results: IMMUNOHISTOCHEMICAL AND MORPHOMETRIC ANALYSIS PERFORMED MANUALLY The tumor cells are Negative for Her2 (1+). Estrogen Receptor: 90%, POSITIVE, STRONG STAINING INTENSITY Progesterone Receptor: 90%, POSITIVE, STRONG STAINING INTENSITY Proliferation Marker Ki67: 10%    07/23/2018 Pathology Results    Diagnosis Breast, left, needle core biopsy, 9:30 o'clock, 4 cm fn - INVASIVE DUCTAL CARCINOMA, GRADE I. SEE NOTE.    07/27/2018 Initial Diagnosis    Malignant neoplasm of upper-inner quadrant of left breast in female, estrogen receptor positive ([place])    08/16/2018 Genetic Testing    The Multi-Cancer Panel offered by [facility] includes sequencing and/or deletion duplication testing of the following 91 genes: AIP, ALK, APC, ATM, AXIN2, BAP1, BARD1, BLM, BMPR1A, BRCA1, BRCA2, BRIP1, BUB1B, CASR, CDC73, CDH1, CDK4, CDKN1B, CDKN1C, CDKN2A, CEBPA, CEP57, CHEK2, CTNNA1, DICER1, DIS3L2, EGFR, ENG, EPCAM, FH, FLCN, GALNT12, GATA2, GPC3, GREM1, HOXB13, HRAS, KIT, MAX, MEN1, MET, MITF, MLH1, MLH3, MSH2, MSH3, MSH6, MUTYH, NBN, NF1, NF2, NTHL1, PALB2, PDGFRA, PHOX2B, PMS2, POLD1, POLE, POT1, PRKAR1A, PTCH1, PTEN, RAD50, RAD51C, RAD51D, RB1, RECQL4, RET, RNF43, RPS20, RUNX1, SDHA, SDHAF2, SDHB,  SDHC, SDHD, SMAD4, SMARCA4, SMARCB1, SMARCE1, STK11, SUFU, TERC, TERT, TMEM127, TP53, TSC1, TSC2, VHL, WRN, WT1  Results: Negative, no  pathogenic variants identified.  The date of this test report is 08/16/2018.     08/31/2018 Surgery    LEFT DOUBLE BRACKETED LUMPECTOMY WITH RADIOACTIVE SEED X'S 2, LEFT SENTINEL LYMPH NODE BIOPSY, INJECT BLUE DYE LEFT BREAST by Dr. Dalbert Batman 08/31/18    08/31/2018 Pathology Results    Diagnosis 08/31/18  1. Breast, lumpectomy, left - INVASIVE DUCTAL CARCINOMA, [place] GRADE 1 OF 3, 0.7 CM - CALCIFICATIONS ASSOCIATED WITH CARCINOMA - MARGINS UNINVOLVED BY CARCINOMA (0.5 CM; POSTERIOR MARGIN) - COLUMNAR CELL AND FIBROCYSTIC CHANGES INCLUDING APOCRINE METAPLASIA - PSEUDOANGIOMATOUS STROMAL HYPERPLASIA - PREVIOUS BIOPSY SITE CHANGES PRESENT - SEE ONCOLOGY TABLE AND COMMENT BELOW 2. Lymph node, sentinel, biopsy, left axillary #1 - NO CARCINOMA IDENTIFIED IN ONE LYMPH NODE (0/1) 3. Lymph node, sentinel, biopsy, left axillary #2 - NO CARCINOMA IDENTIFIED IN ONE LYMPH NODE (0/1) 4. Lymph node, sentinel, biopsy, left axillary #3 - NO CARCINOMA IDENTIFIED IN ONE LYMPH NODE (0/1)    08/31/2018 Cancer Staging    Staging form: Breast, AJCC 8th Edition - Pathologic stage from 08/31/2018: Stage IA (pT1b, pN0, cM0, G1, ER+, PR+, HER2-) - Signed by Truitt Merle, MD on 11/26/2018    10/30/2018 - 11/28/2018 Radiation Therapy    Adjuvant Radiation with Dr. Earney Hamburg 10/30/18-11/28/18    11/2018 -  Anti-estrogen oral therapy    Tamoxifen 23m daily starting 11/2018      CURRENT THERAPY:  Tamoxifen 234mdaily started 11/2018   INTERVAL HISTORY:  Dawn Rogers here for a follow up of left breast cancer. She was able to identify herself by birth date. She notes she is doing well. She denies any issues currently. She notes 2-3 places of her left breast are itchy and irritated from radiation.  She notes she is tolerating Tamoxifen well with no side effects or hot flashes. She notes having spinal pain from bulging disc and arthritis which was present before her start of Tamoxifen. This pain causes tingling in  her hands and lower arms. Her last period was several month ago and was not a full period.     REVIEW OF SYSTEMS:   Constitutional: Denies fevers, chills or abnormal weight loss Eyes: Denies blurriness of vision Ears, nose, mouth, throat, and face: Denies mucositis or sore throat Respiratory: Denies cough, dyspnea or wheezes Cardiovascular: Denies palpitation, chest discomfort or lower extremity swelling Gastrointestinal:  Denies nausea, heartburn or change in bowel habits Skin: Denies abnormal skin rashes MSK: (+) Back pain and arthritis, stable.  Lymphatics: Denies new lymphadenopathy or easy bruising Neurological:Denies numbness new weaknesses (+) tingling of b/l hands and lower arms  Behavioral/Psych: Mood is stable, no new changes  Breast: (+) Left breast skin irritation, tenderness from radiation  All other systems were reviewed with the patient and are negative.  MEDICAL HISTORY:  Past Medical History:  Diagnosis Date  . Anxiety   . [place]   [place]  . Family history of breast cancer   . Family history of esophageal cancer   . Family history of lymphoma   . Family history of ovarian cancer   . GERD (gastroesophageal reflux disease)   . Hypothyroidism   . Neck pain     SURGICAL HISTORY: Past Surgical History:  Procedure Laterality Date  . ANKLE FRACTURE SURGERY Left   . BREAST LUMPECTOMY WITH RADIOACTIVE SEED AND SENTINEL LYMPH NODE BIOPSY Left  08/31/2018   Procedure: LEFT DOUBLE BRACKETED LUMPECTOMY WITH RADIOACTIVE SEED X'S 2, LEFT SENTINEL LYMPH NODE BIOPSY, INJECT BLUE DYE LEFT BREAST;  Surgeon: Fanny Skates, MD;  Location: [facility];  Service: General;  Laterality: Left;  . CHOLECYSTECTOMY    . TUBAL LIGATION      I have reviewed the social history and family history with the patient and they are unchanged from previous note.  ALLERGIES:  has No Known Allergies.  MEDICATIONS:  Current Outpatient Medications   Medication Sig Dispense Refill  . cyclobenzaprine (FLEXERIL) 5 MG tablet Take 5 mg by mouth 3 (three) times daily as needed for muscle spasms.    . ergocalciferol (VITAMIN D2) 50000 units capsule Take 50,000 Units by mouth once a week.    Marland Kitchen FLUoxetine (PROZAC) 10 MG capsule Take 30 mg by mouth daily.    . Ibuprofen-Famotidine (DUEXIS) 800-26.6 MG TABS Take by mouth.    . levothyroxine (SYNTHROID, LEVOTHROID) 25 MCG tablet Take 25 mcg by mouth daily before breakfast.    . LORazepam (ATIVAN) 0.5 MG tablet Take 0.5 mg by mouth every 6 (six) hours as needed for anxiety.    Marland Kitchen omeprazole (PRILOSEC) 20 MG capsule Take 20 mg by mouth daily.    . tamoxifen (NOLVADEX) 20 MG tablet TAKE 1 TABLET BY MOUTH EVERY DAY 90 tablet 1   No current facility-administered medications for this visit.     PHYSICAL EXAMINATION: ECOG PERFORMANCE STATUS: 0 - Asymptomatic  ** No vitals taken today, Exam not performed today **  LABORATORY DATA:  I have reviewed the data as listed CBC Latest Ref Rng & Units 08/01/2018  WBC 3.9 - 10.3 K/uL 6.9  Hemoglobin 11.6 - 15.9 g/dL 13.3  Hematocrit 34.8 - 46.6 % 39.6  Platelets 145 - 400 K/uL 255     CMP Latest Ref Rng & Units 08/01/2018  Glucose 70 - 99 mg/dL 90  BUN 6 - 20 mg/dL 10  Creatinine 0.44 - 1.00 mg/dL 0.83  Sodium 135 - 145 mmol/L 139  Potassium 3.5 - 5.1 mmol/L 4.3  Chloride 98 - 111 mmol/L 105  CO2 22 - 32 mmol/L 27  Calcium 8.9 - 10.3 mg/dL 8.9  Total Protein 6.5 - 8.1 g/dL 6.3(L)  Total Bilirubin 0.3 - 1.2 mg/dL 0.4  Alkaline Phos 38 - 126 U/L 62  AST 15 - 41 U/L 18  ALT 0 - 44 U/L 23      RADIOGRAPHIC STUDIES: I have personally reviewed the radiological images as listed and agreed with the findings in the report. No results found.   ASSESSMENT & PLAN:  Dawn Rogers is a 58 y.o. female with   1.Malignant Neoplasm of Upper-inner Quadrant of Left Breast, Stage IA (pT1b, pN0, cM0). ER 90%,PR 90%,HER2 (-),Ki67 10%. Grade I -She was  diagnosed in 07/2018. She is s/p left breast lumpectomy and adjuvant radiation.  -Given her 0.7 cm tumor and low grade disease, I did not obtain Oncotype on her surgical sample due to the high possibility of low risk of disease -I started her on anti-estrogen therapy with Tamoxifen in 11/2018, her [place] level in 11/2018 indicating she is still premenopausal. She is tolerating well with no side effects.  -We discussed the option of switching tamoxifen to aromatase inhibitor after she became postmenopausal.  I will test her FSH/LH levels in 1 year to see if she is post-menopausal.  -She is clinically doing well and stable. There is no clinical concern for recurrence. -Next mammogram due  in 07/2019, I encouraged her to continue self breast exam -I offered her the chance to attend survivorship clinic. She is interested.  -Survivorship in 3 months and f/u with me in 6 months    2. Bone Health  -I will obtain baseline bone density scan as Aromatase inhibitor can reduce her bone density. DEXA is scheduled for 04/10/2019.   3. Cervical Spine degeneration and arthritis  -with cervical neck pain with tingling of her lower arms and hands. She also has osteoarthritis -No worsening arthralgias since she started tamoxifen. -She will continue to follow up with neurologist    PLAN: -She is clinically doing well, continue Surveillance  -scheduled DEXA on 04/10/19 -Continue Tamoxifen -Lab and survivorship in 3 months -Lab and f/u with me in 6 months   No problem-specific Assessment & Plan notes found for this encounter.   No orders of the defined types were placed in this encounter.  I discussed the assessment and treatment plan with the patient. The patient was provided an opportunity to ask questions and all were answered. The patient agreed with the plan and demonstrated an understanding of the instructions.  The patient was advised to call back or seek an in-person evaluation if the symptoms worsen or if  the condition fails to improve as anticipated.   I provided 10 minutes of non face-to-face telephone visit time during this encounter, and > 50% was spent counseling as documented under my assessment & plan.    Truitt Merle, MD 02/25/2019   I, Joslyn Devon, am acting as scribe for Truitt Merle, MD.   I have reviewed the above documentation for accuracy and completeness, and I agree with the above.

## 2019-02-22 ENCOUNTER — Telehealth: Payer: Self-pay | Admitting: Hematology

## 2019-02-22 NOTE — Telephone Encounter (Signed)
Called patient regarding upcoming [facility] appointment, patient would prefer this to be a telephone visit.

## 2019-02-25 ENCOUNTER — Telehealth: Payer: Self-pay | Admitting: Hematology

## 2019-02-25 ENCOUNTER — Encounter: Payer: Self-pay | Admitting: Hematology

## 2019-02-25 ENCOUNTER — Other Ambulatory Visit: Payer: BC Managed Care – PPO

## 2019-02-25 ENCOUNTER — Inpatient Hospital Stay: Payer: BC Managed Care – PPO | Attending: Radiation Oncology | Admitting: Hematology

## 2019-02-25 DIAGNOSIS — Z923 Personal history of irradiation: Secondary | ICD-10-CM | POA: Diagnosis not present

## 2019-02-25 DIAGNOSIS — Z803 Family history of malignant neoplasm of breast: Secondary | ICD-10-CM

## 2019-02-25 DIAGNOSIS — C50212 Malignant neoplasm of upper-inner quadrant of left female breast: Secondary | ICD-10-CM

## 2019-02-25 DIAGNOSIS — Z79810 Long term (current) use of selective estrogen receptor modulators (SERMs): Secondary | ICD-10-CM

## 2019-02-25 DIAGNOSIS — E039 Hypothyroidism, unspecified: Secondary | ICD-10-CM

## 2019-02-25 DIAGNOSIS — Z807 Family history of other malignant neoplasms of lymphoid, hematopoietic and related tissues: Secondary | ICD-10-CM

## 2019-02-25 DIAGNOSIS — Z170 Estrogen receptor positive status [ER+]: Secondary | ICD-10-CM | POA: Diagnosis not present

## 2019-02-25 DIAGNOSIS — Z8041 Family history of malignant neoplasm of ovary: Secondary | ICD-10-CM

## 2019-02-25 DIAGNOSIS — Z79899 Other long term (current) drug therapy: Secondary | ICD-10-CM

## 2019-02-25 NOTE — Telephone Encounter (Signed)
Scheduled appt per 4/20 los. °

## 2019-03-07 ENCOUNTER — Encounter: Payer: Self-pay | Admitting: *Deleted

## 2019-04-10 ENCOUNTER — Other Ambulatory Visit: Payer: Self-pay

## 2019-04-10 ENCOUNTER — Ambulatory Visit
Admission: RE | Admit: 2019-04-10 | Discharge: 2019-04-10 | Disposition: A | Discharge status: Home / Self Care | Payer: BC Managed Care – PPO | Source: Ambulatory Visit | Attending: Hematology | Admitting: Hematology

## 2019-04-10 ENCOUNTER — Other Ambulatory Visit: Payer: BC Managed Care – PPO

## 2019-04-10 DIAGNOSIS — E2839 Other primary ovarian failure: Secondary | ICD-10-CM

## 2019-04-23 IMAGING — MR MR BREAST BX W LOC DEV EA ADD LESION IMAGE BX SPEC MR GUIDE*L*
6 of 10 series · 33 of 48 positions shown · IV contrast (15 ML MULTIHANCE)
Comparison: Previous exams.

ADDENDUM:
Pathology revealed FIBROCYSTIC CHANGES of the Left breast, both
locations, medial and lateral. This was found to be discordant by
Dr. Habado Yo, with wide excision recommended. Recommend wide
excision including medial and anterior breast tissue where the non
masslike enhancement was visualized.

Pathology results were discussed with the patient by telephone. The
patient reported doing well after the biopsies with tenderness at
the sites. Post biopsy instructions and care were reviewed and
questions were answered. The patient was encouraged to call The
The patient has a recent diagnosis of left breast cancer and should
follow her outlined treatment plan.
Pathology results reported by Nidde Nabila, RN on 08/22/2018.
CLINICAL DATA: Biopsy proven malignancy in the [DATE] region of the
left breast. MRI showed nonlinear masslike enhancement in the medial
aspect of the left breast and a 4 mm nodule in the subareolar aspect
of the left breast. MR guided core biopsies recommended.
EXAM:
MRI GUIDED CORE NEEDLE BIOPSY OF THE LEFT BREAST
TECHNIQUE: Multiplanar, multisequence MR imaging of the left breast was
performed both before and after administration of intravenous
contrast.
CONTRAST:  15mL MULTIHANCE GADOBENATE DIMEGLUMINE 529 MG/ML IV SOLN

[Series 2: fiducial unilateral · sagittal · 2.0mm · 1.33mm/px · 3 of 52 slices shown]
[im 1/52]
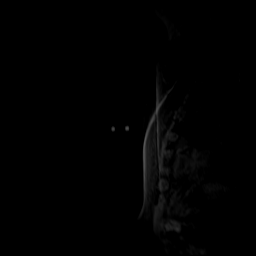
[im 26/52]
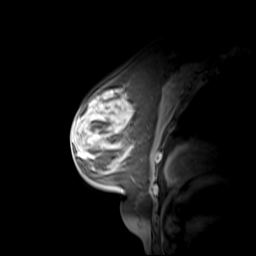
[im 52/52]
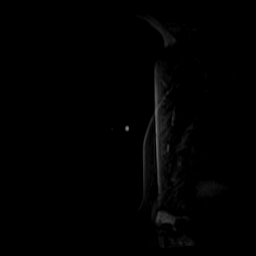

[Series 3: dynamic pre · axial · non-contrast · 1.3mm · 0.73mm/px · z∈[-91,+95]mm · 7 of 144 slices shown]
[im 1/144]
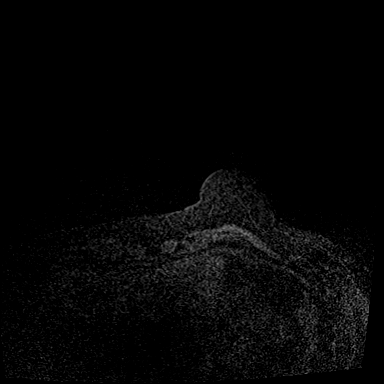
[im 24/144]
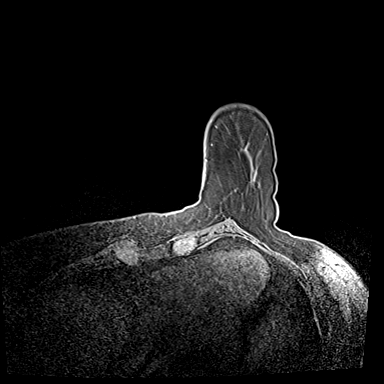
[im 48/144]
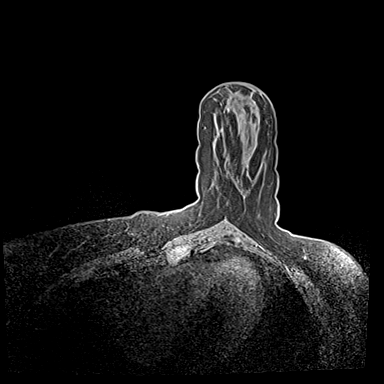
[im 72/144]
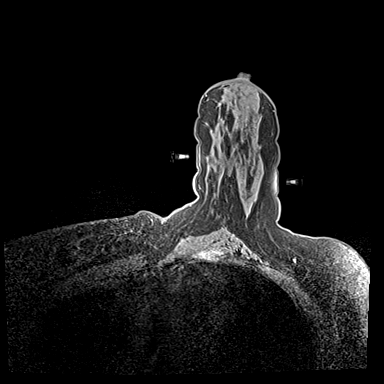
[im 96/144]
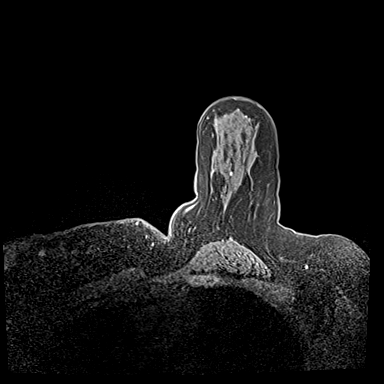
[im 120/144]
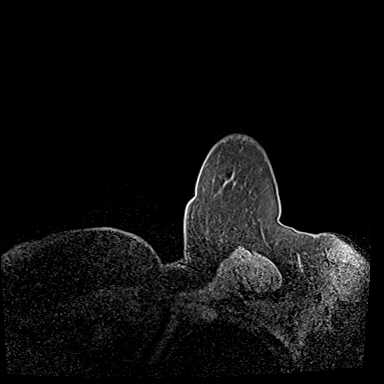
[im 144/144]
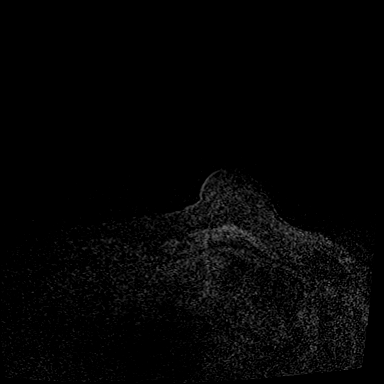

[Series 4: dynamic post 20 · axial · 1.3mm · 0.73mm/px · z∈[-91,+95]mm · 6 of 144 slices shown (1 of 2)]
[im 1/144]
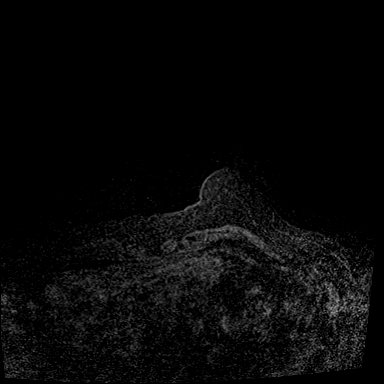
[im 29/144]
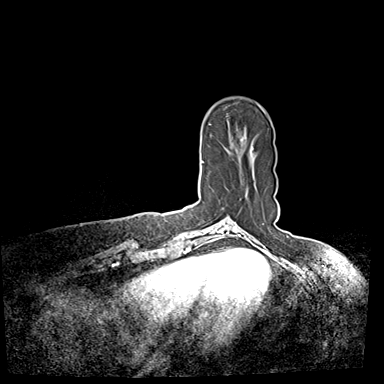
[im 58/144]
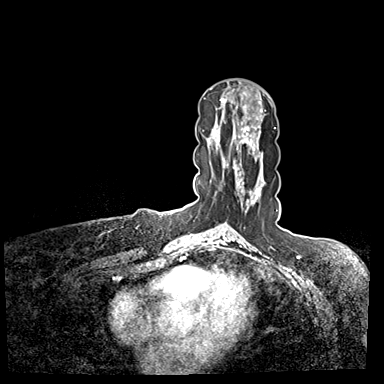
[im 86/144]
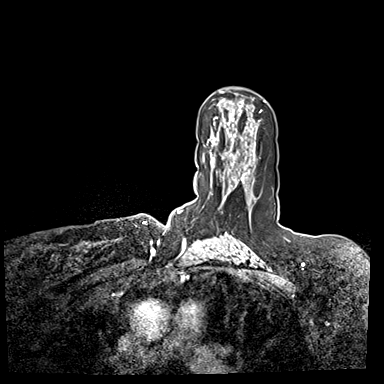
[im 115/144]
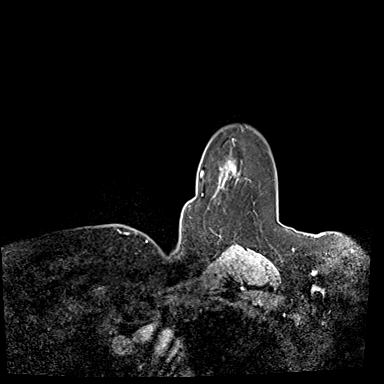
[im 144/144]
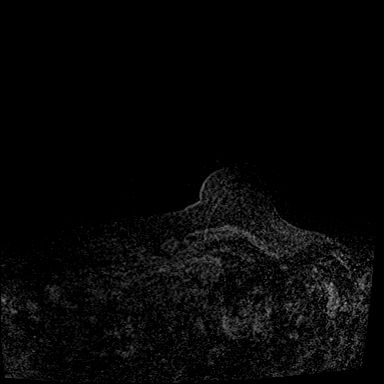

[Series 5: dynamic post 20 · axial · 1.3mm · 0.73mm/px · z∈[-91,+95]mm · 6 of 144 slices shown (2 of 2)]
[im 1/144]
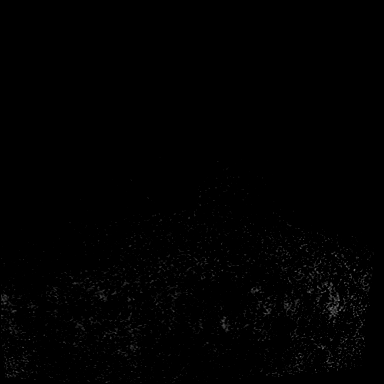
[im 29/144]
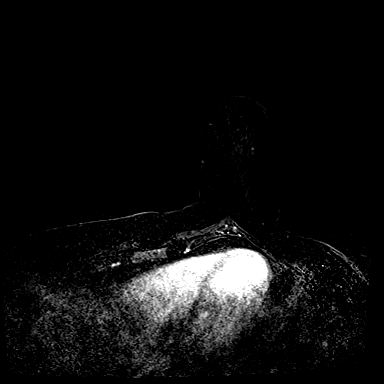
[im 58/144]
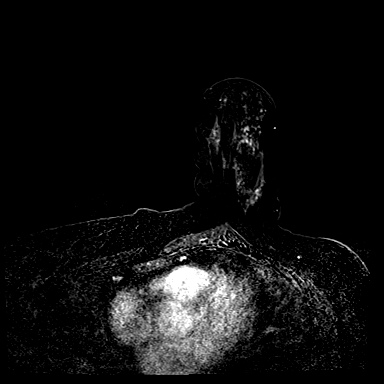
[im 86/144]
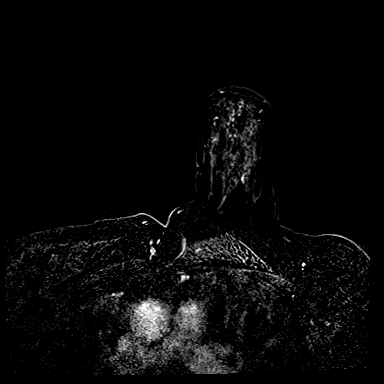
[im 115/144]
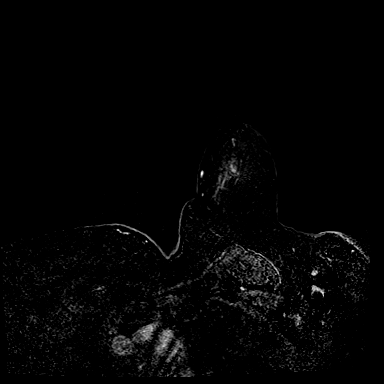
[im 144/144]
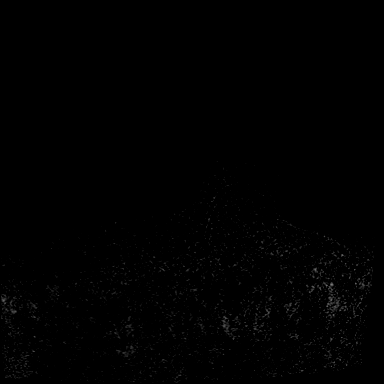

[Series 6: needle confirmation · axial · 1.3mm · 0.73mm/px · z∈[-91,+95]mm · 6 of 144 slices shown]
[im 1/144]
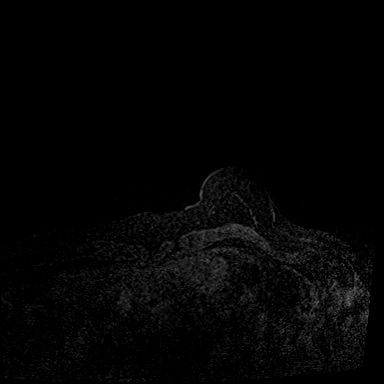
[im 29/144]
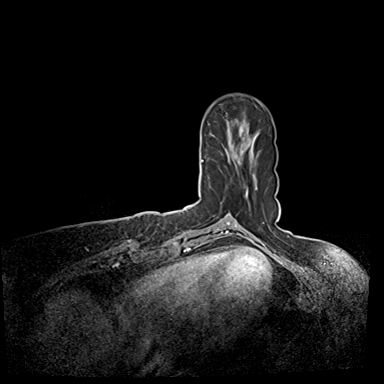
[im 58/144]
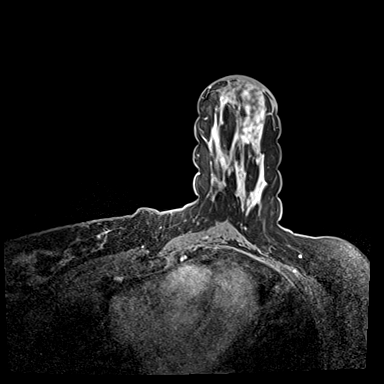
[im 86/144]
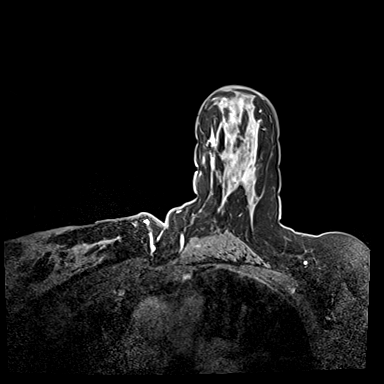
[im 115/144]
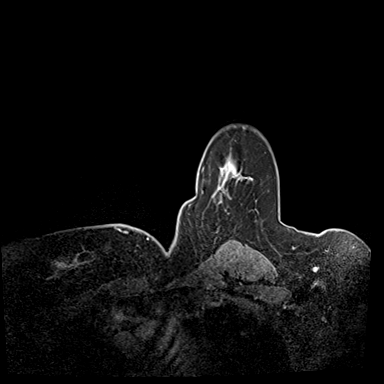
[im 144/144]
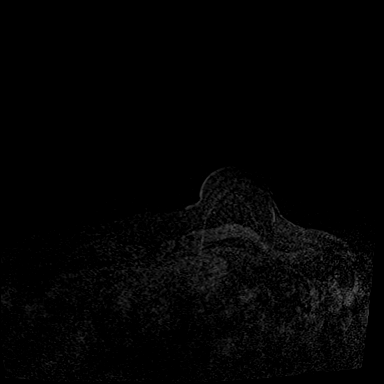

[Series 7: needle confirmation_sub · axial · 1.3mm · 0.73mm/px · z∈[-91,+58]mm · 5 of 144 slices shown]
[im 1/144]
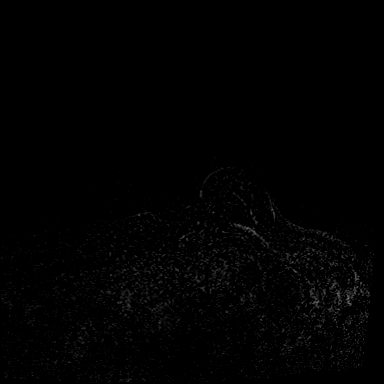
[im 29/144]
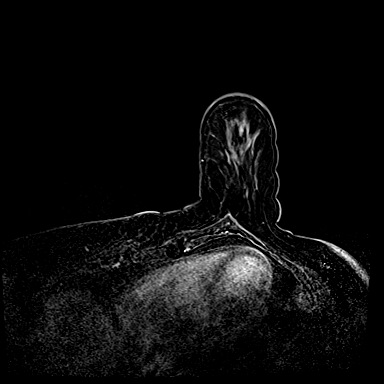
[im 58/144]
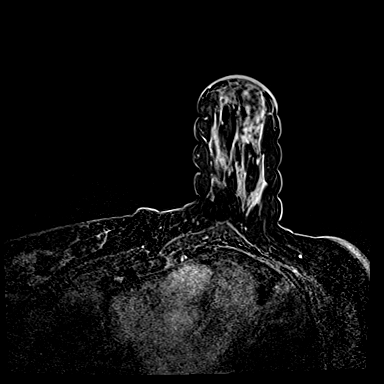
[im 86/144]
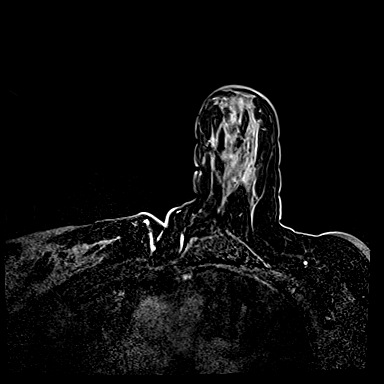
[im 115/144]
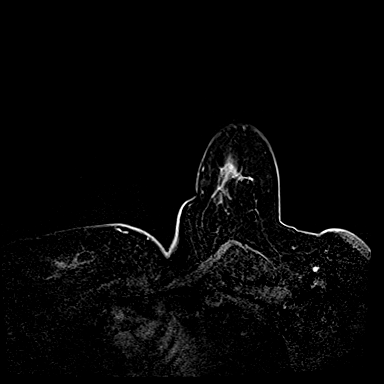

[33 of 48 positions shown; findings below may reference images not displayed]

FINDINGS: I met with the patient, and we discussed the procedure of MRI guided
biopsy, including risks, benefits, and alternatives. Specifically,
we discussed the risks of infection, bleeding, tissue injury, clip
migration, and inadequate sampling. Informed, written consent was
given. The usual time out protocol was performed immediately prior
to the procedure.

Using sterile technique, 1% Lidocaine, MRI guidance, and a 9 gauge
vacuum assisted device, biopsy was performed of non masslike
enhancement in the medial aspect of the left breast using a medial
to lateral approach. At the conclusion of the procedure, a barbell
tissue marker clip was deployed into the biopsy cavity. Follow-up
2-view mammogram was performed and dictated separately.

Using sterile technique, 1% Lidocaine, MRI guidance, and a 9 gauge
vacuum assisted device, biopsy was performed of a nodule in the
subareolar region of the left breast using a lateral to medial
approach. At the conclusion of the procedure, a cylindrical shaped
tissue marker clip was deployed into the biopsy cavity. Follow-up
2-view mammogram was performed and dictated separately.
IMPRESSION: MRI guided biopsies of a nodule in the subareolar region the left
breast (cylindrical shaped clip) and non masslike enhancement in the
medial aspect of the left breast (dumbbell-shaped clip) no apparent
complications.

## 2019-05-02 ENCOUNTER — Other Ambulatory Visit: Payer: Self-pay | Admitting: Hematology

## 2019-05-02 MED ORDER — VENLAFAXINE HCL ER 75 MG PO CP24: 75.0000 mg | ORAL_CAPSULE | Freq: Every day | ORAL | 1 refills | Status: DC

## 2019-05-03 IMAGING — MG NEEDLE LOCALIZATION OF THE LEFT BREAST WITH MAMMO GUIDANCE
2 series · 2 of 2 positions shown · non-contrast
Comparison: Previous exam(s).

CLINICAL DATA: Patient presents for radioactive seed localization a
left breast carcinoma and 2 areas of abnormal MRI enhancement,
previously biopsied yielding fibrocystic changes which felt to be
discordant with excision recommended. The 2 areas of abnormal MRI
enhancement or localized with a cylinder and dumbbell shaped clip
with both clips lying close approximation. The dumbbell shaped clip
had migrated 0.6 cm lateral to the biopsy cavity.

EXAM:
MAMMOGRAPHIC GUIDED RADIOACTIVE SEED LOCALIZATION OF THE LEFT BREAST

[L ML]
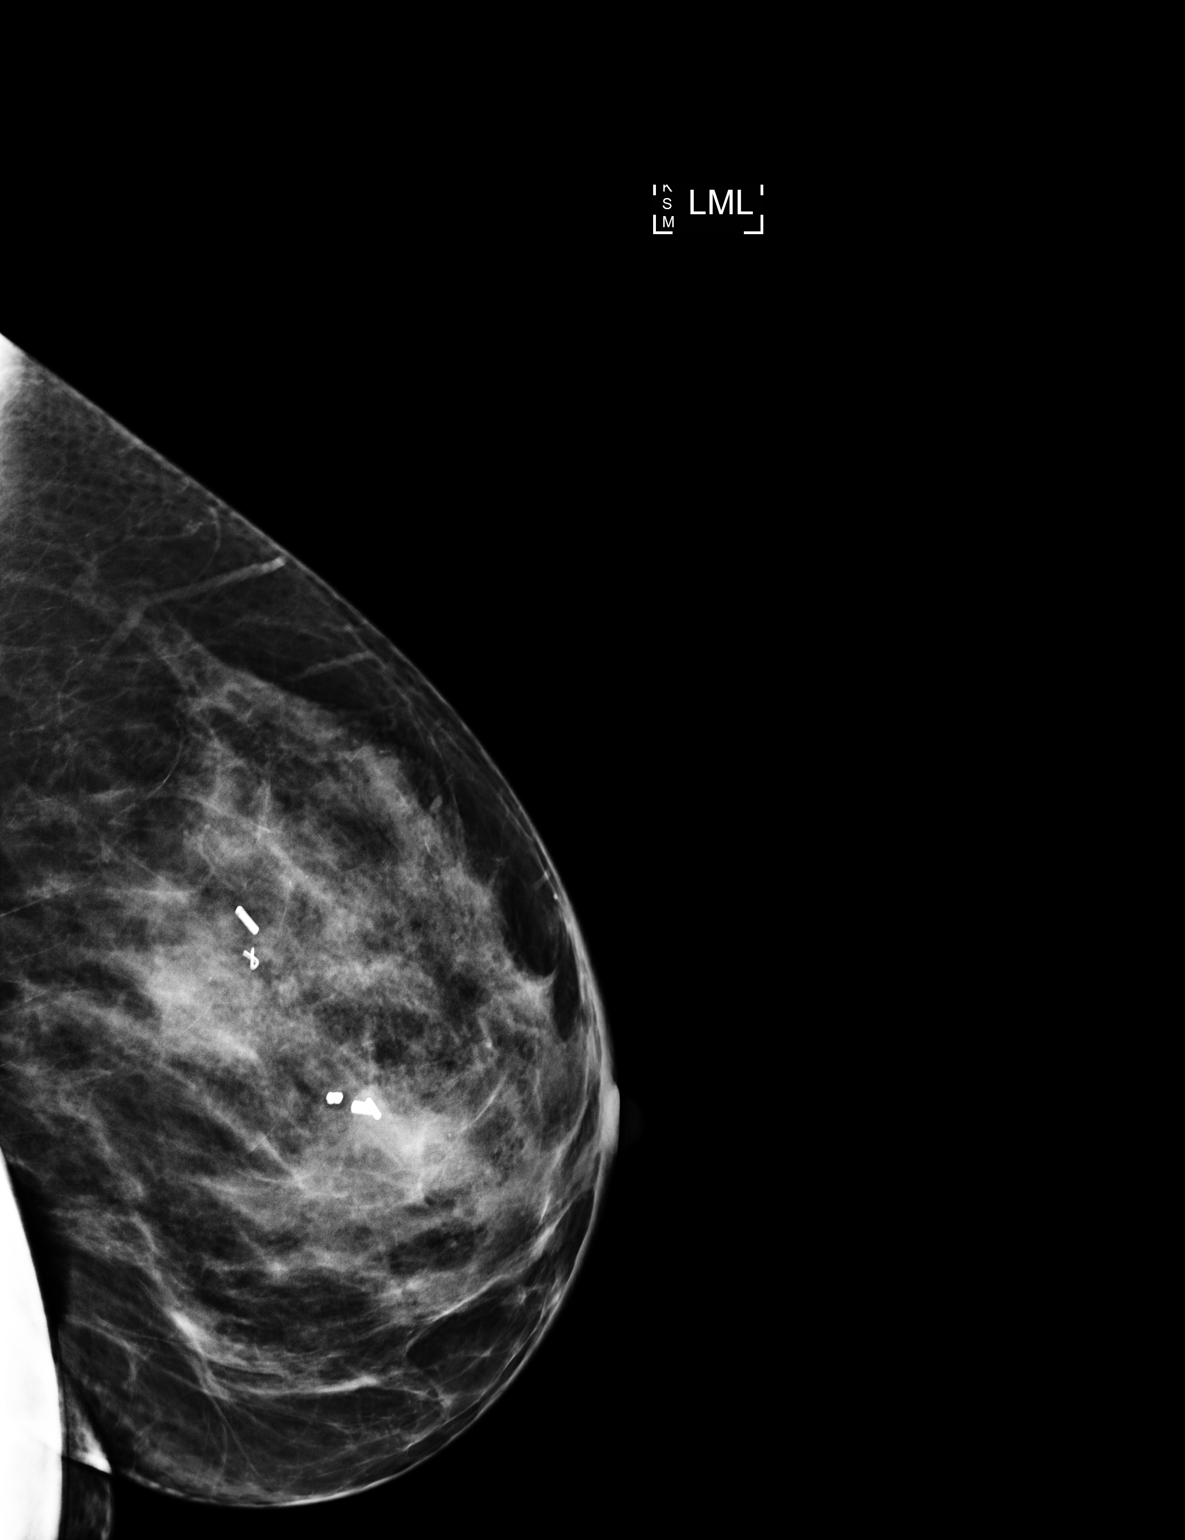

[L CC]
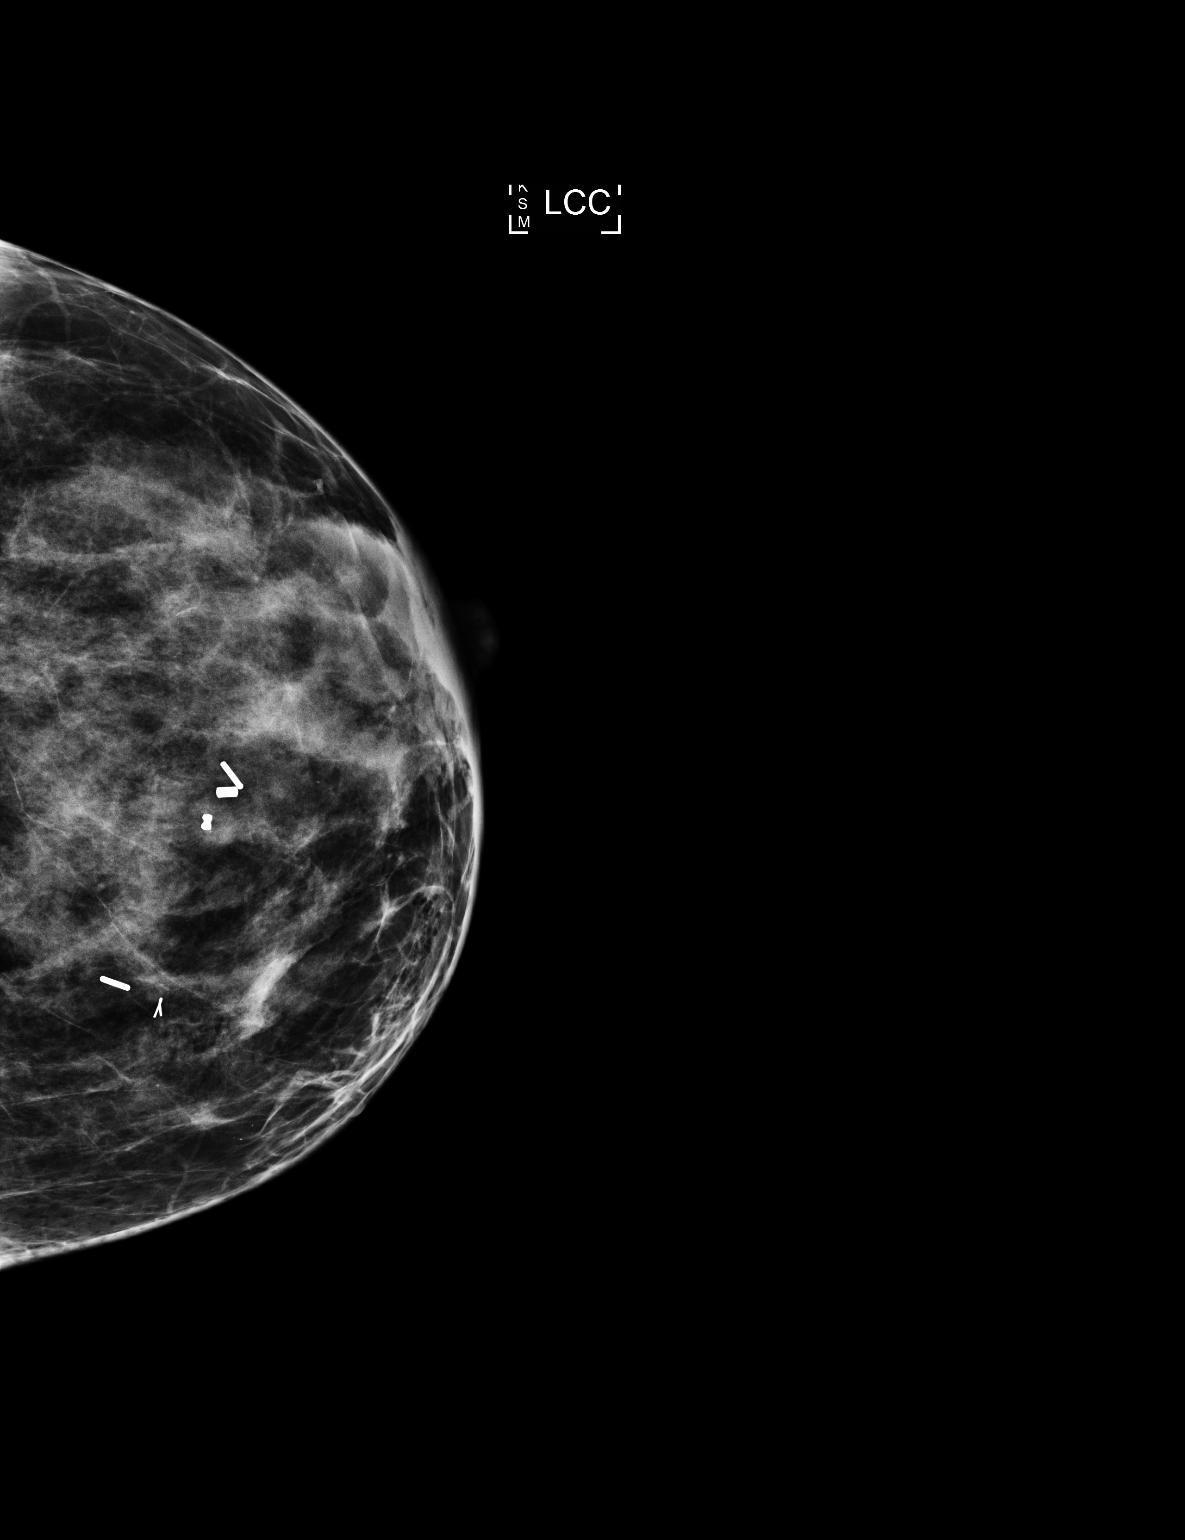

[2 of 2 positions shown; findings below may reference images not displayed]

FINDINGS: Patient presents for radioactive seed localization prior to surgical
excision. I met with the patient and we discussed the procedure of
seed localization including benefits and alternatives. We discussed
the high likelihood of a successful procedure. We discussed the
risks of the procedure including infection, bleeding, tissue injury
and further surgery. We discussed the low dose of radioactivity
involved in the procedure. Informed, written consent was given.

The usual time-out protocol was performed immediately prior to the
procedure.

Ribbon clip: Invasive ductal carcinoma. Using mammographic guidance,
sterile technique, 1% lidocaine and an [id number]-[id number] radioactive seed, the
ribbon shaped biopsy clip was localized using a medial approach. The
follow-up mammogram images confirm the seed in the expected location
and were marked for Dr. Wavy.

Follow-up survey of the patient confirms presence of the radioactive
seed.

Order number of [id number] seed:  [id number].

Total activity:  0.249 millicuries reference Date: 07/20/2018

Dumbell and Cylinder clips: Discordant fibrocystic change. Using
mammographic guidance, sterile technique, 1% lidocaine and an N-7VW
radioactive seed, the cylinder shaped clip and the adjacent dumbbell
shaped clip were localized using a medial approach. The follow-up
mammogram images confirm the seed in the expected location and were
marked for Dr. Wavy.

Follow-up survey of the patient confirms presence of the radioactive
seed.

Order number of [id number] seed:  [id number].

Total activity:  0.249 millicuries reference Date: 07/20/2018

The patient tolerated the procedure well and was released from the
[REDACTED]. She was given instructions regarding seed removal.
IMPRESSION: Radioactive seed localization of the left breast. Two seeds placed.
No apparent complications.

## 2019-05-04 IMAGING — MG BREAST SURGICAL SPECIMEN
1 series · 1 of 1 positions shown · non-contrast
Comparison: Previous exam(s).

CLINICAL DATA: Patient status post left breast lumpectomy.

EXAM:
SPECIMEN RADIOGRAPH OF THE LEFT BREAST

[L]
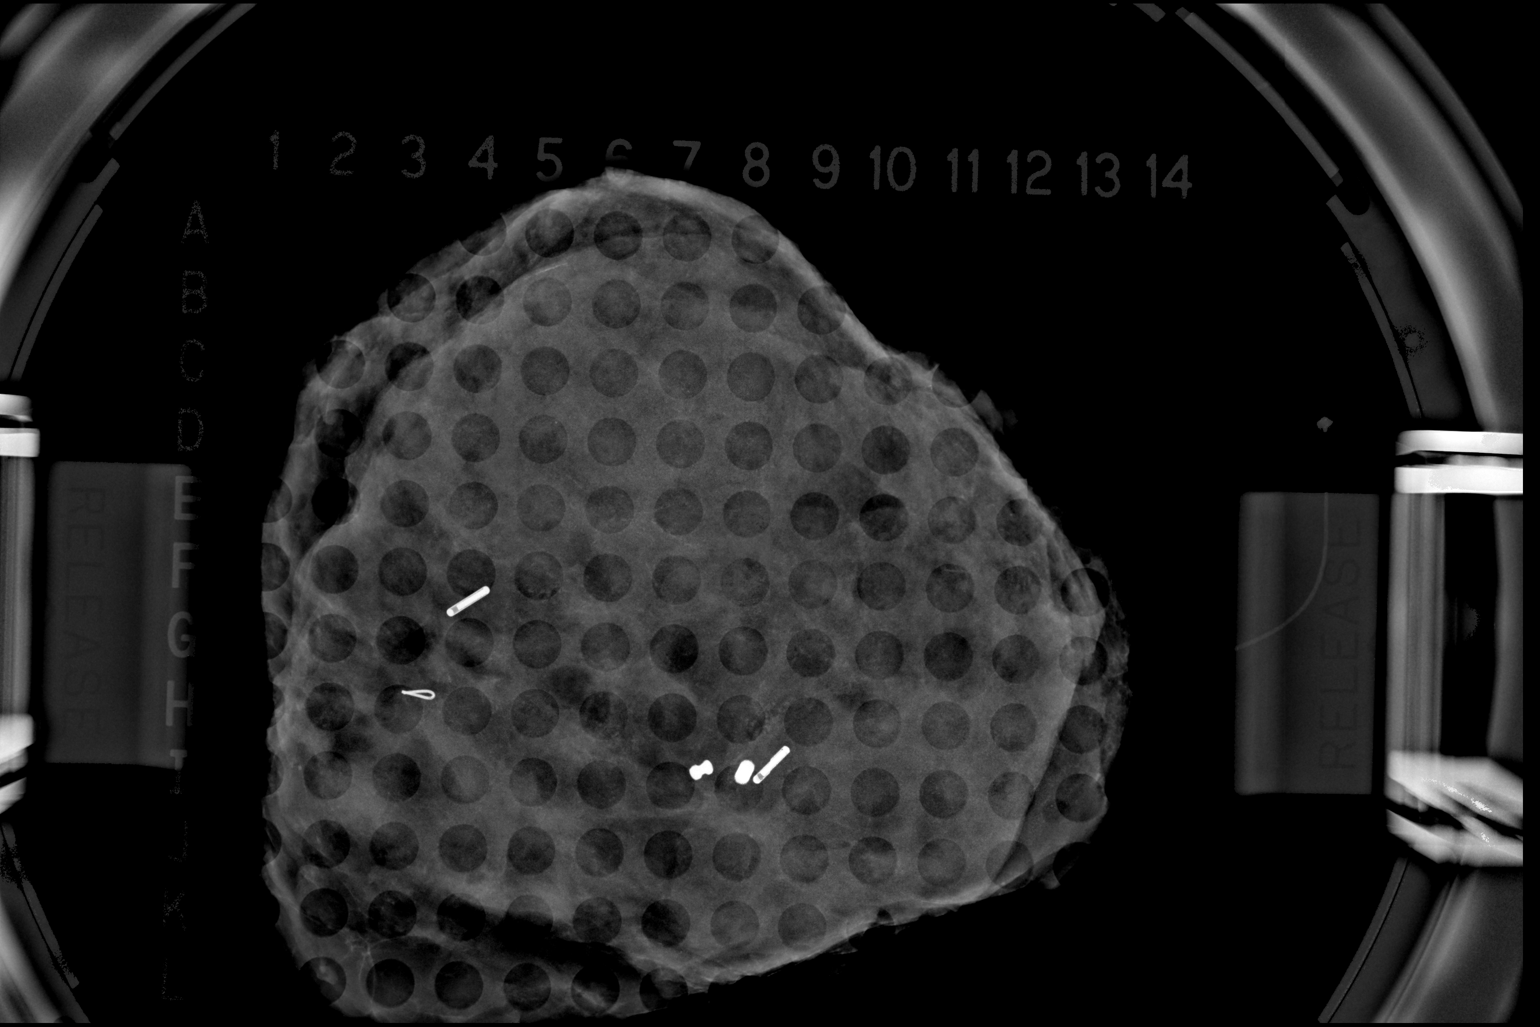

[1 of 1 positions shown; findings below may reference images not displayed]

FINDINGS: Status post excision of the left breast. The radioactive seeds and
biopsy marker clips are present, completely intact, and were marked
for pathology.
IMPRESSION: Specimen radiograph of the left breast.

## 2019-05-09 ENCOUNTER — Telehealth: Payer: Self-pay

## 2019-05-09 NOTE — Telephone Encounter (Signed)
Patient calls with concern over being switched from Prozac to Effexor wanting to know if it is okay to just switch.  I explained she is replacing one drug with another and it should be fine.  She states that she has dealt with pinched nerves and widespread joint pain for years, joint pain has worsened and she plans on seeing an arthritis doctor soon.

## 2019-05-21 NOTE — Progress Notes (Deleted)
CLINIC:  Survivorship   REASON FOR VISIT:  Routine follow-up post-treatment for a recent history of breast cancer.  Patient Care Team: Dione Housekeeper, MD as PCP - General (Family Medicine) Fanny Skates, MD as Consulting Physician (General Surgery) Truitt Merle, MD as Consulting Physician (Hematology) Gery Pray, MD as Consulting Physician (Radiation Oncology) Alla Feeling, NP as Nurse Practitioner (Nurse Practitioner)   BRIEF ONCOLOGIC HISTORY:  Oncology History Overview Note  Cancer Staging Malignant neoplasm of upper-inner quadrant of left breast in female, estrogen receptor positive ([place]) Staging form: Breast, AJCC 8th Edition - Clinical stage from 07/23/2018: Stage IA (cT1b, cN0, cM0, G1, ER+, PR+, HER2-) - Signed by Truitt Merle, MD on 08/01/2018 - Pathologic stage from 08/31/2018: Stage IA (pT1b, pN0, cM0, G1, ER+, PR+, HER2-) - Signed by Truitt Merle, MD on 11/26/2018     Malignant neoplasm of upper-inner quadrant of left breast in female, estrogen receptor positive ([place])  07/13/2018 Mammogram   07/13/2018 Screening Mammogram IMPRESSION: Further evaluation is suggested for possible distortion in the left breast   07/19/2018 Breast US    07/19/2018 Breast US IMPRESSION: 0.6 cm irregular mass with associated architectural distortion in the 9:30 position of the left breast 4 cm from the nipple. Findings are suspicious for malignancy.  Negative for left axillary lymphadenopathy   07/19/2018 Mammogram   07/19/2018 Diagnostic Mammogram IMPRESSION: 0.6 cm irregular mass with associated architectural distortion in the 9:30 position of the left breast 4 cm from the nipple. Findings are suspicious for malignancy.  Negative for left axillary lymphadenopathy.   07/23/2018 Cancer Staging   Staging form: Breast, AJCC 8th Edition - Clinical stage from 07/23/2018: Stage IA (cT1b, cN0, cM0, G1, ER+, PR+, HER2-) - Signed by Truitt Merle, MD on 08/01/2018   07/23/2018 Receptors her2   ADDITIONAL INFORMATION: PROGNOSTIC INDICATORS Results: IMMUNOHISTOCHEMICAL AND MORPHOMETRIC ANALYSIS PERFORMED MANUALLY The tumor cells are Negative for Her2 (1+). Estrogen Receptor: 90%, POSITIVE, STRONG STAINING INTENSITY Progesterone Receptor: 90%, POSITIVE, STRONG STAINING INTENSITY Proliferation Marker Ki67: 10%   07/23/2018 Pathology Results   Diagnosis Breast, left, needle core biopsy, 9:30 o'clock, 4 cm fn - INVASIVE DUCTAL CARCINOMA, GRADE I. SEE NOTE.   07/27/2018 Initial Diagnosis   Malignant neoplasm of upper-inner quadrant of left breast in female, estrogen receptor positive ([place])   08/16/2018 Genetic Testing   The Multi-Cancer Panel offered by [facility] includes sequencing and/or deletion duplication testing of the following 91 genes: AIP, ALK, APC, ATM, AXIN2, BAP1, BARD1, BLM, BMPR1A, BRCA1, BRCA2, BRIP1, BUB1B, CASR, CDC73, CDH1, CDK4, CDKN1B, CDKN1C, CDKN2A, CEBPA, CEP57, CHEK2, CTNNA1, DICER1, DIS3L2, EGFR, ENG, EPCAM, FH, FLCN, GALNT12, GATA2, GPC3, GREM1, HOXB13, HRAS, KIT, MAX, MEN1, MET, MITF, MLH1, MLH3, MSH2, MSH3, MSH6, MUTYH, NBN, NF1, NF2, NTHL1, PALB2, PDGFRA, PHOX2B, PMS2, POLD1, POLE, POT1, PRKAR1A, PTCH1, PTEN, RAD50, RAD51C, RAD51D, RB1, RECQL4, RET, RNF43, RPS20, RUNX1, SDHA, SDHAF2, SDHB, SDHC, SDHD, SMAD4, SMARCA4, SMARCB1, SMARCE1, STK11, SUFU, TERC, TERT, TMEM127, TP53, TSC1, TSC2, VHL, WRN, WT1  Results: Negative, no pathogenic variants identified.  The date of this test report is 08/16/2018.    08/31/2018 Surgery   LEFT DOUBLE BRACKETED LUMPECTOMY WITH RADIOACTIVE SEED X'S 2, LEFT SENTINEL LYMPH NODE BIOPSY, INJECT BLUE DYE LEFT BREAST by Dr. Dalbert Batman 08/31/18   08/31/2018 Pathology Results   Diagnosis 08/31/18  1. Breast, lumpectomy, left - INVASIVE DUCTAL CARCINOMA, [place] GRADE 1 OF 3, 0.7 CM - CALCIFICATIONS ASSOCIATED WITH CARCINOMA - MARGINS UNINVOLVED BY CARCINOMA (0.5 CM; POSTERIOR MARGIN) - COLUMNAR CELL AND FIBROCYSTIC CHANGES INCLUDING  APOCRINE METAPLASIA - PSEUDOANGIOMATOUS STROMAL HYPERPLASIA - PREVIOUS BIOPSY SITE CHANGES PRESENT - SEE ONCOLOGY TABLE AND COMMENT BELOW 2. Lymph node, sentinel, biopsy, left axillary #1 - NO CARCINOMA IDENTIFIED IN ONE LYMPH NODE (0/1) 3. Lymph node, sentinel, biopsy, left axillary #2 - NO CARCINOMA IDENTIFIED IN ONE LYMPH NODE (0/1) 4. Lymph node, sentinel, biopsy, left axillary #3 - NO CARCINOMA IDENTIFIED IN ONE LYMPH NODE (0/1)   08/31/2018 Cancer Staging   Staging form: Breast, AJCC 8th Edition - Pathologic stage from 08/31/2018: Stage IA (pT1b, pN0, cM0, G1, ER+, PR+, HER2-) - Signed by Truitt Merle, MD on 11/26/2018   10/30/2018 - 11/28/2018 Radiation Therapy   Adjuvant Radiation with Dr. Earney Hamburg 10/30/18-11/28/18   11/2018 -  Anti-estrogen oral therapy   Tamoxifen 72m daily starting 11/2018    Survivorship   Per Dawn Rue NP       INTERVAL HISTORY:  [facility] to review her survivorship care plan detailing her treatment course for breast cancer, as well as monitoring long-term side effects of that treatment, education regarding health maintenance, screening, and overall wellness and health promotion.     Overall, Dawn Rogers reports feeling quite well since completing her radiation therapy approximately 3 months ago.  She ***    REVIEW OF SYSTEMS:  Review of Systems - Oncology Breast: Denies any new nodularity, masses, tenderness, nipple changes, or nipple discharge.      ONCOLOGY TREATMENT TEAM:  1. [facility] 2. Medical Oncologist: Dr. FBurr Medico 3. Radiation Oncologist: Dr. KSondra Come    PAST MEDICAL/SURGICAL HISTORY:  Past Medical History:  Diagnosis Date  . Anxiety   . Cancer ([facility] [place]   left breast cancer  . Family history of breast cancer   . Family history of esophageal cancer   . Family history of lymphoma   . Family history of ovarian cancer   .  GERD (gastroesophageal reflux disease)   . Hypothyroidism   . Neck pain    Past Surgical History:  Procedure Laterality Date  . ANKLE FRACTURE SURGERY Left   . BREAST LUMPECTOMY WITH RADIOACTIVE SEED AND SENTINEL LYMPH NODE BIOPSY Left 08/31/2018   Procedure: LEFT DOUBLE BRACKETED LUMPECTOMY WITH RADIOACTIVE SEED X'S 2, LEFT SENTINEL LYMPH NODE BIOPSY, INJECT BLUE DYE LEFT BREAST;  Surgeon: IFanny Skates MD;  [facility]  Service: General;  Laterality: Left;  . CHOLECYSTECTOMY    . TUBAL LIGATION       ALLERGIES:  No Known Allergies   CURRENT MEDICATIONS:  Outpatient Encounter Medications as of 05/27/2019  Medication Sig  . cyclobenzaprine (FLEXERIL) 5 MG tablet Take 5 mg by mouth 3 (three) times daily as needed for muscle spasms.  . ergocalciferol (VITAMIN D2) 50000 units capsule Take 50,000 Units by mouth once a week.  .Marland KitchenFLUoxetine (PROZAC) 10 MG capsule Take 30 mg by mouth daily.  . Ibuprofen-Famotidine (DUEXIS) 800-26.6 MG TABS Take by mouth.  . levothyroxine (SYNTHROID, LEVOTHROID) 25 MCG tablet Take 25 mcg by mouth daily before breakfast.  . LORazepam (ATIVAN) 0.5 MG tablet Take 0.5 mg by mouth every 6 (six) hours as needed for anxiety.  .Marland Kitchenomeprazole (PRILOSEC) 20 MG capsule Take 20 mg by mouth daily.  . tamoxifen (NOLVADEX) 20 MG tablet TAKE 1 TABLET BY MOUTH EVERY DAY  . venlafaxine XR (EFFEXOR-XR) 75 MG 24 hr capsule Take 1 capsule (75 mg total) by mouth daily with breakfast.  No facility-administered encounter medications on file as of 05/27/2019.      ONCOLOGIC FAMILY HISTORY:  Family History  Problem Relation Age of Onset  . Breast cancer Sister 22  . Lung cancer Sister   . Leukemia Mother        'on the verge of leukemia'  . Melanoma Mother   . Cancer Paternal Grandfather        unsure kind of cancer- mass on neck  . Lymphoma Daughter   . Cancer Paternal Aunt        unsure kind of cancer   . Lung cancer Paternal Aunt   .  Esophageal cancer Father 30  . Ovarian cancer Paternal Aunt 75       also cancer in colon and liver-unk if primary or met     GENETIC COUNSELING/TESTING: None  SOCIAL HISTORY:  Dawn Rogers is /single/married/divorced/widowed/separated and lives alone/with her spouse/family/friend in (city), [place].  She has (#) children and they live in (city).  Dawn Rogers is currently retired/disabled/working part-time/full-time as ***.  She denies any current or history of tobacco, alcohol, or illicit drug use.     PHYSICAL EXAMINATION:  Vital Signs:  There were no vitals filed for this visit. There were no vitals filed for this visit. General: Well-nourished, well-appearing female in no acute distress.  She is unaccompanied/accompanied in clinic by her ***** today.   HEENT: Head is normocephalic.  Pupils equal and reactive to light. Conjunctivae clear without exudate.  Sclerae anicteric. Oral mucosa is pink, moist.  Oropharynx is pink without lesions or erythema.  Lymph: No cervical, supraclavicular, or infraclavicular lymphadenopathy noted on palpation.  Cardiovascular: Regular rate and rhythm.Marland Kitchen Respiratory: Clear to auscultation bilaterally. Chest expansion symmetric; breathing non-labored.  GI: Abdomen soft and round; non-tender, non-distended. Bowel sounds normoactive.  GU: Deferred.  Neuro: No focal deficits. Steady gait.  Psych: Mood and affect normal and appropriate for situation.  Extremities: No edema. MSK: No focal spinal tenderness to palpation.  Full range of motion in bilateral upper extremities Skin: Warm and dry.  LABORATORY DATA:  None for this visit.  DIAGNOSTIC IMAGING:  None for this visit.      ASSESSMENT AND PLAN:  Ms.. Rogers is a pleasant 58 y.o. female with Stage IA left breast invasive ductal carcinoma, ER+/PR+/HER2-, diagnosed in 07/2018, treated with lumpectomy, adjuvant radiation therapy, and anti-estrogen therapy with Tamoxifen beginning in 11/2018.  She  presents to the Survivorship Clinic for our initial meeting and routine follow-up post-completion of treatment for breast cancer.    1. Stage IA left breast cancer:  Dawn Rogers is continuing to recover from definitive treatment for breast cancer. She will follow-up with her medical oncologist, Dr. Burr Medico in 08/2019 with history and physical exam per surveillance protocol.  She will continue her anti-estrogen therapy with Tamoxifen. Thus far, she is tolerating *** well, with minimal side effects. She was instructed to make Dr. Burr Medico or myself aware if she begins to experience any worsening side effects of the medication and I could see her back in clinic to help manage those side effects, as needed. Though the incidence is low, there is an associated risk of endometrial cancer with anti-estrogen therapies like Tamoxifen.  Dawn Rogers was encouraged to contact Dr. Burr Medico or myself with any vaginal bleeding while taking Tamoxifen. Other side effects of Tamoxifen were again reviewed with her as well.  She will be due for first diagnostic mammogram since diagnosis in 07/2019. Today, a comprehensive survivorship care plan  and treatment summary was reviewed with the patient today detailing her breast cancer diagnosis, treatment course, potential late/long-term effects of treatment, appropriate follow-up care with recommendations for the future, and patient education resources.  A copy of this summary, along with a letter will be sent to the patient's primary care provider via mail/fax/In Basket message after today's visit.    #. Problem(s) at Visit______________  #. Bone health: We obtained baseline DEXA screening on 04/10/2019 which reveals normal BMD.  She was encouraged to increase her consumption of foods rich in calcium, as well as increase her weight-bearing activities.  She was given education on specific activities to promote bone health.  #. Cancer screening:  Due to Dawn Rogers's history and her age, she should  receive screening for skin cancers, colon cancer, and gynecologic cancers.  It does not appear she has had a screening colonoscopy, I recommend she undergo testing for early colon cancer detection. The information and recommendations are listed on the patient's comprehensive care plan/treatment summary and were reviewed in detail with the patient.    #. Health maintenance and wellness promotion: Dawn Rogers was encouraged to consume 5-7 servings of fruits and vegetables per day. We reviewed the "Nutrition Rainbow" handout, as well as the handout "Take Control of Your Health and Reduce Your Cancer Risk" from the [place].  She was also encouraged to engage in moderate to vigorous exercise for 30 minutes per day most days of the week. We discussed the [employer] fitness program, which is designed for cancer survivors to help them become more physically fit after cancer treatments.  She was instructed to limit her alcohol consumption and continue to abstain from tobacco use/***was encouraged stop smoking.     #. Support services/counseling: It is not uncommon for this period of the patient's cancer care trajectory to be one of many emotions and stressors.  We discussed an opportunity for her to participate in the next session of [employer] [facility] ("Finding Your New Normal") support group series designed for patients after they have completed treatment.   Dawn Rogers was encouraged to take advantage of our many other support services programs, support groups, and/or counseling in coping with her new life as a cancer survivor after completing anti-cancer treatment.  She was offered support today through active listening and expressive supportive counseling.  She was given information regarding our available services and encouraged to contact me with any questions or for help enrolling in any of our support group/programs.    Dispo:   -Return to cancer center ***  -Mammogram due in ***  -Follow up with  surgery *** -She is welcome to return back to the Survivorship Clinic at any time; no additional follow-up needed at this time.  -Consider referral back to survivorship as a long-term survivor for continued surveillance  A total of (30) minutes of face-to-face time was spent with this patient with greater than 50% of that time in counseling and care-coordination.   Dawn Rue, NP Survivorship Program [facility] [phone]   Note: PRIMARY CARE PROVIDER Dione Housekeeper, [place] [phone]

## 2019-05-23 DIAGNOSIS — M159 Polyosteoarthritis, unspecified: Secondary | ICD-10-CM | POA: Insufficient documentation

## 2019-05-24 ENCOUNTER — Other Ambulatory Visit: Payer: Self-pay

## 2019-05-24 ENCOUNTER — Telehealth: Payer: Self-pay | Admitting: Hematology

## 2019-05-24 DIAGNOSIS — C50212 Malignant neoplasm of upper-inner quadrant of left female breast: Secondary | ICD-10-CM

## 2019-05-24 NOTE — Telephone Encounter (Signed)
LB out mvoed appointment from 7/20 to 7/30. Confirmed with patient.

## 2019-05-27 ENCOUNTER — Inpatient Hospital Stay: Payer: BC Managed Care – PPO | Admitting: Nurse Practitioner

## 2019-05-27 ENCOUNTER — Inpatient Hospital Stay: Payer: BC Managed Care – PPO

## 2019-05-30 ENCOUNTER — Other Ambulatory Visit: Payer: Self-pay | Admitting: Hematology

## 2019-06-04 NOTE — Progress Notes (Signed)
CLINIC:  Survivorship   REASON FOR VISIT:  Routine follow-up post-treatment for a recent history of breast cancer.  Patient Care Team: Blair Heys, PA-C as PCP - General (Physician Assistant) Fanny Skates, MD as Consulting Physician (General Surgery) Truitt Merle, MD as Consulting Physician (Hematology) Gery Pray, MD as Consulting Physician (Radiation Oncology) Alla Feeling, NP as Nurse Practitioner (Nurse Practitioner)  BRIEF ONCOLOGIC HISTORY:  Oncology History Overview Note  Cancer Staging Malignant neoplasm of upper-inner quadrant of left breast in female, estrogen receptor positive ([place]) Staging form: Breast, AJCC 8th Edition - Clinical stage from 07/23/2018: Stage IA (cT1b, cN0, cM0, G1, ER+, PR+, HER2-) - Signed by Truitt Merle, MD on 08/01/2018 - Pathologic stage from 08/31/2018: Stage IA (pT1b, pN0, cM0, G1, ER+, PR+, HER2-) - Signed by Truitt Merle, MD on 11/26/2018     Malignant neoplasm of upper-inner quadrant of left breast in female, estrogen receptor positive ([place])  07/13/2018 Mammogram   07/13/2018 Screening Mammogram IMPRESSION: Further evaluation is suggested for possible distortion in the left breast   07/19/2018 Breast US    07/19/2018 Breast US IMPRESSION: 0.6 cm irregular mass with associated architectural distortion in the 9:30 position of the left breast 4 cm from the nipple. Findings are suspicious for malignancy.  Negative for left axillary lymphadenopathy   07/19/2018 Mammogram   07/19/2018 Diagnostic Mammogram IMPRESSION: 0.6 cm irregular mass with associated architectural distortion in the 9:30 position of the left breast 4 cm from the nipple. Findings are suspicious for malignancy.  Negative for left axillary lymphadenopathy.   07/23/2018 Cancer Staging   Staging form: Breast, AJCC 8th Edition - Clinical stage from 07/23/2018: Stage IA (cT1b, cN0, cM0, G1, ER+, PR+, HER2-) - Signed by Truitt Merle, MD on 08/01/2018   07/23/2018 Receptors her2     ADDITIONAL INFORMATION: PROGNOSTIC INDICATORS Results: IMMUNOHISTOCHEMICAL AND MORPHOMETRIC ANALYSIS PERFORMED MANUALLY The tumor cells are Negative for Her2 (1+). Estrogen Receptor: 90%, POSITIVE, STRONG STAINING INTENSITY Progesterone Receptor: 90%, POSITIVE, STRONG STAINING INTENSITY Proliferation Marker Ki67: 10%   07/23/2018 Pathology Results   Diagnosis Breast, left, needle core biopsy, 9:30 o'clock, 4 cm fn - INVASIVE DUCTAL CARCINOMA, GRADE I. SEE NOTE.   07/27/2018 Initial Diagnosis   Malignant neoplasm of upper-inner quadrant of left breast in female, estrogen receptor positive ([place])   08/16/2018 Genetic Testing   The Multi-Cancer Panel offered by [facility] includes sequencing and/or deletion duplication testing of the following 91 genes: AIP, ALK, APC, ATM, AXIN2, BAP1, BARD1, BLM, BMPR1A, BRCA1, BRCA2, BRIP1, BUB1B, CASR, CDC73, CDH1, CDK4, CDKN1B, CDKN1C, CDKN2A, CEBPA, CEP57, CHEK2, CTNNA1, DICER1, DIS3L2, EGFR, ENG, EPCAM, FH, FLCN, GALNT12, GATA2, GPC3, GREM1, HOXB13, HRAS, KIT, MAX, MEN1, MET, MITF, MLH1, MLH3, MSH2, MSH3, MSH6, MUTYH, NBN, NF1, NF2, NTHL1, PALB2, PDGFRA, PHOX2B, PMS2, POLD1, POLE, POT1, PRKAR1A, PTCH1, PTEN, RAD50, RAD51C, RAD51D, RB1, RECQL4, RET, RNF43, RPS20, RUNX1, SDHA, SDHAF2, SDHB, SDHC, SDHD, SMAD4, SMARCA4, SMARCB1, SMARCE1, STK11, SUFU, TERC, TERT, TMEM127, TP53, TSC1, TSC2, VHL, WRN, WT1  Results: Negative, no pathogenic variants identified.  The date of this test report is 08/16/2018.    08/31/2018 Surgery   LEFT DOUBLE BRACKETED LUMPECTOMY WITH RADIOACTIVE SEED X'S 2, LEFT SENTINEL LYMPH NODE BIOPSY, INJECT BLUE DYE LEFT BREAST by Dr. Dalbert Batman 08/31/18   08/31/2018 Pathology Results   Diagnosis 08/31/18  1. Breast, lumpectomy, left - INVASIVE DUCTAL CARCINOMA, [place]HAM GRADE 1 OF 3, 0.7 CM - CALCIFICATIONS ASSOCIATED WITH CARCINOMA - MARGINS UNINVOLVED BY CARCINOMA (0.5 CM; POSTERIOR MARGIN) - COLUMNAR CELL AND FIBROCYSTIC CHANGES  INCLUDING APOCRINE METAPLASIA - PSEUDOANGIOMATOUS STROMAL HYPERPLASIA - PREVIOUS BIOPSY SITE CHANGES PRESENT - SEE ONCOLOGY TABLE AND COMMENT BELOW 2. Lymph node, sentinel, biopsy, left axillary #1 - NO CARCINOMA IDENTIFIED IN ONE LYMPH NODE (0/1) 3. Lymph node, sentinel, biopsy, left axillary #2 - NO CARCINOMA IDENTIFIED IN ONE LYMPH NODE (0/1) 4. Lymph node, sentinel, biopsy, left axillary #3 - NO CARCINOMA IDENTIFIED IN ONE LYMPH NODE (0/1)   08/31/2018 Cancer Staging   Staging form: Breast, AJCC 8th Edition - Pathologic stage from 08/31/2018: Stage IA (pT1b, pN0, cM0, G1, ER+, PR+, HER2-) - Signed by Truitt Merle, MD on 11/26/2018   10/30/2018 - 11/28/2018 Radiation Therapy   Adjuvant Radiation with Dr. Earney Hamburg 10/30/18-11/28/18   11/2018 -  Anti-estrogen oral therapy   Tamoxifen 13m daily starting 11/2018   06/06/2019 Survivorship   [name]       INTERVAL HISTORY:  [facility] initial meeting to review her survivorship care plan detailing her treatment course for breast cancer, as well as monitoring long-term side effects of that treatment, education regarding health maintenance, screening, and overall wellness and health promotion.     Overall, Ms. Nobbe reports feeling quite well since completing her radiation therapy approximately 6 months ago.  She reports having pinched nerves in her neck and back that cause generalized body pain. She has intermittent joint swelling in her hands, has been tested for RA which was negative. Still she was referred to rheumatology and was diagnosed with osteoarthritis. She was referred to pain specialist. She remains functional, able to walk 2-3 miles per day. Pain is manageable most days. Pain is progressive, not sure if it's worse on tamoxifen. She has mild intermittent hot flashes. Mood and hot flashes are stable after switching from prozac to effexor. She does self breast exam occasionally  but has lumpy breasts. Dr. NEdrick Ohretired and she will be [facility]    REVIEW OF SYSTEMS:  Review of Systems  Constitutional: Positive for fatigue. Negative for appetite change.       Energy level fluctuates with joint pain  Respiratory: Negative for cough and shortness of breath.   Cardiovascular: Negative for chest pain and leg swelling.  Gastrointestinal: Negative for abdominal pain, blood in stool, constipation, diarrhea, nausea and vomiting.  Endocrine: Positive for hot flashes.       Intermittent, mild   Genitourinary: Negative for vaginal bleeding and vaginal discharge.   Musculoskeletal: Positive for arthralgias, back pain and neck pain.       Pinched nerves   Psychiatric/Behavioral:       Nervousness, improved on effexor. Decreased libido   Breast: Denies any new nodularity, masses, tenderness, nipple changes, or nipple discharge.    ONCOLOGY TREATMENT TEAM:  1. [facility]  [facility] 2. Medical Oncologist: Dr. FBurr Medico3. Radiation Oncologist: Dr. KSondra Come   PAST MEDICAL/SURGICAL HISTORY:  Past Medical History:  Diagnosis Date   Anxiety    [place]   left breast cancer   Family history of breast cancer    Family history of esophageal cancer    Family history of lymphoma    Family history of ovarian cancer    GERD (gastroesophageal reflux disease)    Hypothyroidism    Neck pain    Past Surgical History:  Procedure Laterality Date   ANKLE FRACTURE SURGERY Left    BREAST LUMPECTOMY WITH RADIOACTIVE  SEED AND SENTINEL LYMPH NODE BIOPSY Left 08/31/2018   Procedure: LEFT DOUBLE BRACKETED LUMPECTOMY WITH RADIOACTIVE SEED X'S 2, LEFT SENTINEL LYMPH NODE BIOPSY, INJECT BLUE DYE LEFT BREAST;  Surgeon: Fanny Skates, MD;  Location: [facility];  Service: General;  Laterality: Left;   CHOLECYSTECTOMY     TUBAL LIGATION       ALLERGIES:  No Known  Allergies   CURRENT MEDICATIONS:  Outpatient Encounter Medications as of 06/06/2019  Medication Sig   cyclobenzaprine (FLEXERIL) 5 MG tablet Take 5 mg by mouth 3 (three) times daily as needed for muscle spasms.   ergocalciferol (VITAMIN D2) 50000 units capsule Take 50,000 Units by mouth once a week.   Ibuprofen-Famotidine (DUEXIS) 800-26.6 MG TABS Take by mouth.   levothyroxine (SYNTHROID, LEVOTHROID) 25 MCG tablet Take 25 mcg by mouth daily before breakfast.   LORazepam (ATIVAN) 0.5 MG tablet Take 0.5 mg by mouth every 6 (six) hours as needed for anxiety.   omeprazole (PRILOSEC) 20 MG capsule Take 20 mg by mouth daily.   tamoxifen (NOLVADEX) 20 MG tablet TAKE 1 TABLET BY MOUTH EVERY DAY   venlafaxine XR (EFFEXOR-XR) 75 MG 24 hr capsule TAKE 1 CAPSULE (75 MG TOTAL) BY MOUTH DAILY WITH BREAKFAST.   [DISCONTINUED] FLUoxetine (PROZAC) 10 MG capsule Take 30 mg by mouth daily.   No facility-administered encounter medications on file as of 06/06/2019.      ONCOLOGIC FAMILY HISTORY:  Family History  Problem Relation Age of Onset   Breast cancer Sister 42   Lung cancer Sister    Leukemia Mother        'on the verge of leukemia'   Melanoma Mother    Cancer Paternal Grandfather        unsure kind of cancer- mass on neck   Lymphoma Daughter    Cancer Paternal Aunt        unsure kind of cancer    Lung cancer Paternal Aunt    Esophageal cancer Father 80   Ovarian cancer Paternal Aunt 37       also cancer in colon and liver-unk if primary or met     GENETIC COUNSELING/TESTING: Negative test, on 08/01/2018   SOCIAL HISTORY:  IMO CUMBIE denies any current or history of tobacco, alcohol, or illicit drug use.     PHYSICAL EXAMINATION:  Vital Signs:   Vitals:   06/06/19 0923  BP: 137/79  Pulse: 83  Resp: 18  Temp: 98.4 F (36.9 C)  SpO2: 98%   Filed Weights   06/06/19 0923  Weight: 159 lb 14.4 oz (72.5 kg)   General: Well-nourished, well-appearing  female in no acute distress.  HEENT:  Sclerae anicteric  Lymph: No cervical, supraclavicular, or infraclavicular lymphadenopathy noted on palpation.  Cardiovascular: Regular rate and rhythm. Respiratory: Clear to auscultation bilaterally.  breathing non-labored.  Neuro: Grossly normal. Steady gait.  Psych: Mood and affect normal and appropriate for situation.  Extremities: No edema. MSK:  Full range of motion in bilateral upper extremities Skin: Warm and dry. Breast exam: s/p left lumpectomy, areolar and axillary incisions completely healed. Mild left breast hyperpigmentation and central skin thickening. No palpable mass in either breast or axilla that I could appreciate.   LABORATORY DATA:  CBC Latest Ref Rng & Units 06/06/2019 08/01/2018  WBC 4.0 - 10.5 K/uL 5.2 6.9  Hemoglobin 12.0 - 15.0 g/dL 13.2 13.3  Hematocrit 36.0 - 46.0 % 39.0 39.6  Platelets 150 - 400 K/uL 222 255   CMP Latest  Ref Rng & Units 06/06/2019 08/01/2018  Glucose 70 - 99 mg/dL 137(H) 90  BUN 6 - 20 mg/dL 13 10  Creatinine 0.44 - 1.00 mg/dL 0.86 0.83  Sodium 135 - 145 mmol/L 137 139  Potassium 3.5 - 5.1 mmol/L 4.1 4.3  Chloride 98 - 111 mmol/L 107 105  CO2 22 - 32 mmol/L 23 27  Calcium 8.9 - 10.3 mg/dL 8.5(L) 8.9  Total Protein 6.5 - 8.1 g/dL 6.2(L) 6.3(L)  Total Bilirubin 0.3 - 1.2 mg/dL 0.3 0.4  Alkaline Phos 38 - 126 U/L 58 62  AST 15 - 41 U/L 23 18  ALT 0 - 44 U/L 28 23    DIAGNOSTIC IMAGING:  None for this visit.      ASSESSMENT AND PLAN:  Ms.. Nordhoff is a pleasant 58 y.o. female with Stage IA left breast invasive ductal carcinoma, ER+/PR+/HER2-, diagnosed in 07/2018, treated with lumpectomy, adjuvant radiation therapy, and anti-estrogen therapy with Tamoxifen beginning in 11/2018.  She presents to the Survivorship Clinic for our initial meeting and routine follow-up post-completion of treatment for breast cancer.    1. Stage IA left breast cancer:  Ms. Karn is continuing to recover from definitive  treatment for breast cancer. She will follow-up with her medical oncologist, Dr. Burr Medico in 08/2019 with history and physical exam per surveillance protocol.  She will continue her anti-estrogen therapy with Tamoxifen. Thus far, she is tolerating well with mild intermittent hot flash. Her joint pain is addressed below. She was instructed to make Dr. Burr Medico or myself aware if she begins to experience any worsening side effects of the medication and I could see her back in clinic to help manage those side effects, as needed. Though the incidence is low, there is an associated risk of endometrial cancer with anti-estrogen therapies like Tamoxifen.  Ms. Cohea was encouraged to contact Dr. Burr Medico or myself with any vaginal bleeding while taking Tamoxifen. Other side effects of Tamoxifen were again reviewed with her as well. LMP 07/2018; plan to check [facility] in 2021 to confirm she is postmenopausal. Will discuss switching to AI at that time. Next mammogram due in 07/2019, ordered today. Due to breast density category D she is a candidate for breast MRI, which is more sensitive than mammogram. Breast exam today shows mild hyperpigmentation, otherwise unremarkable; no clinical concern for recurrence. Labs unremarkable. Today, a comprehensive survivorship care plan and treatment summary was reviewed with the patient today detailing her breast cancer diagnosis, treatment course, potential late/long-term effects of treatment, appropriate follow-up care with recommendations for the future, and patient education resources.  A copy of this summary, along with a letter will be sent to the patients primary care provider via In Basket message after todays visit.    2. Diffuse joint pain and swelling, pinched nerves in neck and back: this has been ongoing for years, but progressive lately. She is not sure if it's related to tamoxifen, but it's possible. She has been seen by rheumatology, neurology, and pain specialist. She remains active,  able to walk 2-3 miles per day. Overall her symptoms are tolerable and manageable.   3. Bone health:  Ms. Turner is premenopausal, on tamoxifen which has bone strengthening quality. She had baseline DEXA in 04/2019 in the event she changes to AI once she is postmenopausal. Result was normal. In the meantime, she was encouraged to begin calcium supplement daily in addition to weekly vitamin D3 she is currently taking. Ca 8.5 today. I encouraged her to increase her weight-bearing activities.  She was given education on specific activities to promote bone health.  4. Cancer screening:  Due to Ms. Shuffler's history and her age, she should receive screening for skin cancers, colon cancer, and gynecologic cancers. She has annual Gyn exam coming up in September. Per her report she had colonoscopy 3 years ago per Dr. Penelope Coop which was normal, she believes she is on 10 year recall. The information and recommendations are listed on the patient's comprehensive care plan/treatment summary and were reviewed in detail with the patient.    5. Health maintenance and wellness promotion: Ms. Heideman was encouraged to consume 5-7 servings of fruits and vegetables per day. We reviewed the "Nutrition Rainbow" handout, as well as the handout "Take Control of Your Health and Reduce Your Cancer Risk" from the [place].  She was also encouraged to engage in moderate to vigorous exercise for 30 minutes per day most days of the week. We discussed the [employer] fitness program, which is designed for cancer survivors to help them become more physically fit after cancer treatments.  She was instructed to limit her alcohol consumption and continue to abstain from tobacco use.     6. Support services/counseling: It is not uncommon for this period of the patient's cancer care trajectory to be one of many emotions and stressors.  We discussed an opportunity for her to participate in the next session of [facility] ("Finding Your New  Normal") support group series designed for patients after they have completed treatment.   Ms. Gotcher was encouraged to take advantage of our many other support services programs, support groups, and/or counseling in coping with her new life as a cancer survivor after completing anti-cancer treatment.  She was offered support today through active listening and expressive supportive counseling.  She was given information regarding our available services and encouraged to contact me with any questions or for help enrolling in any of our support group/programs.    Dispo:   -Return to cancer center 08/2019 -Mammogram due in 07/2019, with bilateral MRI -Follow up with surgery 09/2019  -begin calcium supplement daily, continue vitamin D -She is welcome to return back to the Survivorship Clinic at any time; no additional follow-up needed at this time.  -Consider referral back to survivorship as a long-term survivor for continued surveillance  A total of (30) minutes of face-to-face time was spent with this patient with greater than 50% of that time in counseling and care-coordination.   Cira Rue, NP Survivorship Program [facility] [phone]   Note: PRIMARY CARE PROVIDER Blair Heys, [place] [phone]

## 2019-06-06 ENCOUNTER — Inpatient Hospital Stay (HOSPITAL_BASED_OUTPATIENT_CLINIC_OR_DEPARTMENT_OTHER): Payer: BC Managed Care – PPO | Admitting: Nurse Practitioner

## 2019-06-06 ENCOUNTER — Encounter: Payer: Self-pay | Admitting: Nurse Practitioner

## 2019-06-06 ENCOUNTER — Inpatient Hospital Stay: Payer: BC Managed Care – PPO | Attending: Hematology

## 2019-06-06 ENCOUNTER — Other Ambulatory Visit: Payer: Self-pay

## 2019-06-06 ENCOUNTER — Telehealth: Payer: Self-pay | Admitting: Nurse Practitioner

## 2019-06-06 VITALS — BP 137/79 | HR 83 | Temp 98.4°F | Resp 18 | Ht 64.0 in | Wt 159.9 lb

## 2019-06-06 DIAGNOSIS — M2550 Pain in unspecified joint: Secondary | ICD-10-CM | POA: Diagnosis not present

## 2019-06-06 DIAGNOSIS — Z170 Estrogen receptor positive status [ER+]: Secondary | ICD-10-CM | POA: Insufficient documentation

## 2019-06-06 DIAGNOSIS — C50212 Malignant neoplasm of upper-inner quadrant of left female breast: Secondary | ICD-10-CM

## 2019-06-06 DIAGNOSIS — R922 Inconclusive mammogram: Secondary | ICD-10-CM

## 2019-06-06 LAB — CMP (CANCER CENTER ONLY)
ALT: 28 U/L (ref 0–44)
AST: 23 U/L (ref 15–41)
Albumin: 3.4 g/dL — ABNORMAL LOW (ref 3.5–5.0)
Alkaline Phosphatase: 58 U/L (ref 38–126)
Anion gap: 7 (ref 5–15)
BUN: 13 mg/dL (ref 6–20)
CO2: 23 mmol/L (ref 22–32)
Calcium: 8.5 mg/dL — ABNORMAL LOW (ref 8.9–10.3)
Chloride: 107 mmol/L (ref 98–111)
Creatinine: 0.86 mg/dL (ref 0.44–1.00)
GFR, Est AFR Am: >60 mL/min (ref >60)
GFR, Estimated: >60 mL/min (ref >60)
Glucose, Bld: 137 mg/dL — ABNORMAL HIGH (ref 70–99)
Potassium: 4.1 mmol/L (ref 3.5–5.1)
Sodium: 137 mmol/L (ref 135–145)
Total Bilirubin: 0.3 mg/dL (ref 0.3–1.2)
Total Protein: 6.2 g/dL — ABNORMAL LOW (ref 6.5–8.1)

## 2019-06-06 LAB — CBC WITH DIFFERENTIAL (CANCER CENTER ONLY)
Abs Immature Granulocytes: 0.02 10*3/uL (ref 0.00–0.07)
Basophils Absolute: 0 10*3/uL (ref 0.0–0.1)
Basophils Relative: 1 %
Eosinophils Absolute: 0.3 10*3/uL (ref 0.0–0.5)
Eosinophils Relative: 6 %
HCT: 39 % (ref 36.0–46.0)
Hemoglobin: 13.2 g/dL (ref 12.0–15.0)
Immature Granulocytes: 0 %
Lymphocytes Relative: 28 %
Lymphs Abs: 1.4 10*3/uL (ref 0.7–4.0)
MCH: 32.7 pg (ref 26.0–34.0)
MCHC: 33.8 g/dL (ref 30.0–36.0)
MCV: 96.5 fL (ref 80.0–100.0)
Monocytes Absolute: 0.5 10*3/uL (ref 0.1–1.0)
Monocytes Relative: 9 %
Neutro Abs: 3 10*3/uL (ref 1.7–7.7)
Neutrophils Relative %: 56 %
Platelet Count: 222 10*3/uL (ref 150–400)
RBC: 4.04 MIL/uL (ref 3.87–5.11)
RDW: 11.9 % (ref 11.5–15.5)
WBC Count: 5.2 10*3/uL (ref 4.0–10.5)
nRBC: 0 % (ref 0.0–0.2)

## 2019-06-06 NOTE — Telephone Encounter (Signed)
No los per 7/30. °

## 2019-06-20 DIAGNOSIS — K219 Gastro-esophageal reflux disease without esophagitis: Secondary | ICD-10-CM | POA: Insufficient documentation

## 2019-06-20 DIAGNOSIS — Z8041 Family history of malignant neoplasm of ovary: Secondary | ICD-10-CM | POA: Insufficient documentation

## 2019-06-20 DIAGNOSIS — E559 Vitamin D deficiency, unspecified: Secondary | ICD-10-CM | POA: Insufficient documentation

## 2019-06-20 DIAGNOSIS — F411 Generalized anxiety disorder: Secondary | ICD-10-CM | POA: Insufficient documentation

## 2019-06-20 DIAGNOSIS — Z800 Family history of malignant neoplasm of digestive organs: Secondary | ICD-10-CM | POA: Insufficient documentation

## 2019-06-20 DIAGNOSIS — Z803 Family history of malignant neoplasm of breast: Secondary | ICD-10-CM | POA: Insufficient documentation

## 2019-07-16 ENCOUNTER — Other Ambulatory Visit: Payer: Self-pay | Admitting: General Surgery

## 2019-07-16 DIAGNOSIS — C50212 Malignant neoplasm of upper-inner quadrant of left female breast: Secondary | ICD-10-CM

## 2019-07-18 ENCOUNTER — Other Ambulatory Visit: Payer: Self-pay

## 2019-07-18 ENCOUNTER — Ambulatory Visit
Admission: RE | Admit: 2019-07-18 | Discharge: 2019-07-18 | Disposition: A | Discharge status: Home / Self Care | Payer: BC Managed Care – PPO | Source: Ambulatory Visit | Attending: Nurse Practitioner | Admitting: Nurse Practitioner

## 2019-07-18 DIAGNOSIS — C50212 Malignant neoplasm of upper-inner quadrant of left female breast: Secondary | ICD-10-CM

## 2019-07-18 DIAGNOSIS — Z170 Estrogen receptor positive status [ER+]: Secondary | ICD-10-CM

## 2019-07-18 HISTORY — DX: Personal history of irradiation: Z92.3

## 2019-08-01 ENCOUNTER — Other Ambulatory Visit: Payer: Self-pay | Admitting: Hematology

## 2019-08-09 ENCOUNTER — Ambulatory Visit
Admission: RE | Admit: 2019-08-09 | Discharge: 2019-08-09 | Disposition: A | Discharge status: Home / Self Care | Payer: BC Managed Care – PPO | Source: Ambulatory Visit | Attending: General Surgery | Admitting: General Surgery

## 2019-08-09 ENCOUNTER — Other Ambulatory Visit: Payer: Self-pay

## 2019-08-09 DIAGNOSIS — C50212 Malignant neoplasm of upper-inner quadrant of left female breast: Secondary | ICD-10-CM

## 2019-08-09 MED ORDER — GADOBUTROL 1 MMOL/ML IV SOLN
7.0000 mL | Freq: Once | INTRAVENOUS | Status: AC | PRN
  Administered 2019-08-09: 12:00:00 7 mL via INTRAVENOUS

## 2019-08-26 ENCOUNTER — Ambulatory Visit: Payer: BC Managed Care – PPO | Admitting: Hematology

## 2019-08-26 ENCOUNTER — Other Ambulatory Visit: Payer: BC Managed Care – PPO

## 2019-08-26 NOTE — Progress Notes (Signed)
Dawn Rogers   [phone] [phone]) [phone]   Clinic Follow up Note   Patient Care Team: Blair Heys, PA-C as PCP - General (Physician Assistant) Fanny Skates, MD as Consulting Physician (General Surgery) Truitt Merle, MD as Consulting Physician (Hematology) Gery Pray, MD as Consulting Physician (Radiation Oncology) Alla Feeling, NP as Nurse Practitioner (Nurse Practitioner)  Date of Service:  09/04/2019  CHIEF COMPLAINT:  F/u of left breast cancer  SUMMARY OF ONCOLOGIC HISTORY: Oncology History Overview Note  Cancer Staging Malignant neoplasm of upper-inner quadrant of left breast in female, estrogen receptor positive ([place]) Staging form: Breast, AJCC 8th Edition - Clinical stage from 07/23/2018: Stage IA (cT1b, cN0, cM0, G1, ER+, PR+, HER2-) - Signed by Truitt Merle, MD on 08/01/2018 - Pathologic stage from 08/31/2018: Stage IA (pT1b, pN0, cM0, G1, ER+, PR+, HER2-) - Signed by Truitt Merle, MD on 11/26/2018     Malignant neoplasm of upper-inner quadrant of left breast in female, estrogen receptor positive ([place])  07/13/2018 Mammogram   07/13/2018 Screening Mammogram IMPRESSION: Further evaluation is suggested for possible distortion in the left breast   07/19/2018 Breast US    07/19/2018 Breast US IMPRESSION: 0.6 cm irregular mass with associated architectural distortion in the 9:30 position of the left breast 4 cm from the nipple. Findings are suspicious for malignancy.  Negative for left axillary lymphadenopathy   07/19/2018 Mammogram   07/19/2018 Diagnostic Mammogram IMPRESSION: 0.6 cm irregular mass with associated architectural distortion in the 9:30 position of the left breast 4 cm from the nipple. Findings are suspicious for malignancy.  Negative for left axillary lymphadenopathy.   07/23/2018 Cancer Staging   Staging form: Breast, AJCC 8th Edition - Clinical stage from 07/23/2018: Stage IA (cT1b, cN0, cM0, G1, ER+, PR+, HER2-) -  Signed by Truitt Merle, MD on 08/01/2018   07/23/2018 Receptors her2   ADDITIONAL INFORMATION: PROGNOSTIC INDICATORS Results: IMMUNOHISTOCHEMICAL AND MORPHOMETRIC ANALYSIS PERFORMED MANUALLY The tumor cells are Negative for Her2 (1+). Estrogen Receptor: 90%, POSITIVE, STRONG STAINING INTENSITY Progesterone Receptor: 90%, POSITIVE, STRONG STAINING INTENSITY Proliferation Marker Ki67: 10%   07/23/2018 Pathology Results   Diagnosis Breast, left, needle core biopsy, 9:30 o'clock, 4 cm fn - INVASIVE DUCTAL CARCINOMA, GRADE I. SEE NOTE.   07/27/2018 Initial Diagnosis   Malignant neoplasm of upper-inner quadrant of left breast in female, estrogen receptor positive ([place])   08/16/2018 Genetic Testing   The Multi-Cancer Panel offered by [facility] includes sequencing and/or deletion duplication testing of the following 91 genes: AIP, ALK, APC, ATM, AXIN2, BAP1, BARD1, BLM, BMPR1A, BRCA1, BRCA2, BRIP1, BUB1B, CASR, CDC73, CDH1, CDK4, CDKN1B, CDKN1C, CDKN2A, CEBPA, CEP57, CHEK2, CTNNA1, DICER1, DIS3L2, EGFR, ENG, EPCAM, FH, FLCN, GALNT12, GATA2, GPC3, GREM1, HOXB13, HRAS, KIT, MAX, MEN1, MET, MITF, MLH1, MLH3, MSH2, MSH3, MSH6, MUTYH, NBN, NF1, NF2, NTHL1, PALB2, PDGFRA, PHOX2B, PMS2, POLD1, POLE, POT1, PRKAR1A, PTCH1, PTEN, RAD50, RAD51C, RAD51D, RB1, RECQL4, RET, RNF43, RPS20, RUNX1, SDHA, SDHAF2, SDHB, SDHC, SDHD, SMAD4, SMARCA4, SMARCB1, SMARCE1, STK11, SUFU, TERC, TERT, TMEM127, TP53, TSC1, TSC2, VHL, WRN, WT1  Results: Negative, no pathogenic variants identified.  The date of this test report is 08/16/2018.    08/31/2018 Surgery   LEFT DOUBLE BRACKETED LUMPECTOMY WITH RADIOACTIVE SEED X'S 2, LEFT SENTINEL LYMPH NODE BIOPSY, INJECT BLUE DYE LEFT BREAST by Dr. Dalbert Batman 08/31/18   08/31/2018 Pathology Results   Diagnosis 08/31/18  1. Breast, lumpectomy, left - INVASIVE DUCTAL CARCINOMA, [place] GRADE 1 OF 3, 0.7 CM - CALCIFICATIONS ASSOCIATED WITH CARCINOMA - MARGINS UNINVOLVED BY CARCINOMA (  0.5 CM;  POSTERIOR MARGIN) - COLUMNAR CELL AND FIBROCYSTIC CHANGES INCLUDING APOCRINE METAPLASIA - PSEUDOANGIOMATOUS STROMAL HYPERPLASIA - PREVIOUS BIOPSY SITE CHANGES PRESENT - SEE ONCOLOGY TABLE AND COMMENT BELOW 2. Lymph node, sentinel, biopsy, left axillary #1 - NO CARCINOMA IDENTIFIED IN ONE LYMPH NODE (0/1) 3. Lymph node, sentinel, biopsy, left axillary #2 - NO CARCINOMA IDENTIFIED IN ONE LYMPH NODE (0/1) 4. Lymph node, sentinel, biopsy, left axillary #3 - NO CARCINOMA IDENTIFIED IN ONE LYMPH NODE (0/1)   08/31/2018 Cancer Staging   Staging form: Breast, AJCC 8th Edition - Pathologic stage from 08/31/2018: Stage IA (pT1b, pN0, cM0, G1, ER+, PR+, HER2-) - Signed by Truitt Merle, MD on 11/26/2018   10/30/2018 - 11/28/2018 Radiation Therapy   Adjuvant Radiation with Dr. Earney Hamburg 10/30/18-11/28/18   11/2018 -  Anti-estrogen oral therapy   Tamoxifen 84m daily starting 11/2018   06/06/2019 Survivorship   [name]        CURRENT THERAPY:  Tamoxifen 271mdaily started 11/2018   INTERVAL HISTORY:  Dawn Rogers here for a follow up and treatment. She presents to the clinic alone. She notes she is doing well. She notes having upper back pain for years due to pinched nerve. She notes she started having mid pain when she takes a deep breath or coughs. She notes this is has been going on for 1 month. She only occasionally will have back pain outside of this. She also notes lower back pain for the past few months. She notes stretching helps her pain. This pain can get up to 8/10. She has been taking ibuprofen and slow release BID. She notes she has joint pain and swelling from osteoarthritis. She also takes Gabapentin and Flexeril. She denies being depressed but will get emotional at times.     REVIEW OF SYSTEMS:  * Constitutional: Denies fevers, chills or abnormal weight loss Eyes: Denies blurriness of vision Ears, nose, mouth, throat, and face: Denies mucositis or sore throat  Respiratory: Denies cough, dyspnea or wheezes Cardiovascular: Denies palpitation, chest discomfort or lower extremity swelling Gastrointestinal:  Denies nausea, heartburn or change in bowel habits Skin: Denies abnormal skin rashes MSK: (+) OA (+) Recent Mid and lower back pain (+) Chronic upper back pain  Lymphatics: Denies new lymphadenopathy or easy bruising Neurological:Denies numbness, tingling or new weaknesses Behavioral/Psych: Mood is stable, no new changes  All other systems were reviewed with the patient and are negative.  MEDICAL HISTORY:  Past Medical History:  Diagnosis Date  . Anxiety   . [place]   [place] cancer  . Family history of breast cancer   . Family history of esophageal cancer   . Family history of lymphoma   . Family history of ovarian cancer   . GERD (gastroesophageal reflux disease)   . Hypothyroidism   . Neck pain   . Personal history of radiation therapy     SURGICAL HISTORY: Past Surgical History:  Procedure Laterality Date  . ANKLE FRACTURE SURGERY Left   . BREAST LUMPECTOMY Left 08/2018  . BREAST LUMPECTOMY WITH RADIOACTIVE SEED AND SENTINEL LYMPH NODE BIOPSY Left 08/31/2018   Procedure: LEFT DOUBLE BRACKETED LUMPECTOMY WITH RADIOACTIVE SEED X'S 2, LEFT SENTINEL LYMPH NODE BIOPSY, INJECT BLUE DYE LEFT BREAST;  Surgeon: InFanny SkatesMD;  [facility] [place] [facility];  Laterality: Left;  . CHOLECYSTECTOMY    . TUBAL LIGATION      I have reviewed the social history and family history with  the patient and they are unchanged from previous note.  ALLERGIES:  has No Known Allergies.  MEDICATIONS:  Current Outpatient Medications  Medication Sig Dispense Refill  . cyclobenzaprine (FLEXERIL) 5 MG tablet Take 5 mg by mouth 3 (three) times daily as needed for muscle spasms.    . ergocalciferol (VITAMIN D2) 50000 units capsule Take 50,000 Units by mouth once a week.    . gabapentin (NEURONTIN) 100 MG  capsule Take 100 mg by mouth 3 (three) times daily. Taking one in am, one in pm and three at bedtime    . Ibuprofen-Famotidine (DUEXIS) 800-26.6 MG TABS Take by mouth.    . levothyroxine (SYNTHROID, LEVOTHROID) 25 MCG tablet Take 25 mcg by mouth daily before breakfast.    . LORazepam (ATIVAN) 0.5 MG tablet Take 0.5 mg by mouth every 6 (six) hours as needed for anxiety.    Marland Kitchen omeprazole (PRILOSEC) 20 MG capsule Take 20 mg by mouth daily.    . tamoxifen (NOLVADEX) 20 MG tablet TAKE 1 TABLET BY MOUTH EVERY DAY 90 tablet 1   No current facility-administered medications for this visit.     PHYSICAL EXAMINATION: ECOG PERFORMANCE STATUS: 1 - Symptomatic but completely ambulatory  Vitals:   09/04/19 1137  BP: 138/82  Pulse: 69  Resp: 18  Temp: 98.2 F (36.8 C)  SpO2: 100%   Filed Weights   09/04/19 1137  Weight: 162 lb 8 oz (73.7 kg)    GENERAL:alert, no distress and comfortable SKIN: skin color, texture, turgor are normal, no rashes or significant lesions EYES: normal, Conjunctiva are pink and non-injected, sclera clear  NECK: supple, thyroid normal size, non-tender, without nodularity LYMPH:  no palpable lymphadenopathy in the cervical, axillary  LUNGS: clear to auscultation and percussion with normal breathing effort HEART: regular rate & rhythm and no murmurs and no lower extremity edema ABDOMEN:abdomen soft, non-tender and normal bowel sounds (+) RLQ abdominal tenderness, No hepatomegaly  Musculoskeletal:no cyanosis of digits and no clubbing (+) Thoracic spine tenderness NEURO: alert & oriented x 3 with fluent speech, no focal motor/sensory deficits BREAST: s/p left lumpectomy: Surgical incision healed well. (+) Lumpy breast tissue (+) Skin discoloration around nipple. No palpable mass, nodules or adenopathy bilaterally. Breast exam benign.   LABORATORY DATA:  I have reviewed the data as listed CBC Latest Ref Rng & Units 09/04/2019 06/06/2019 08/01/2018  WBC 4.0 - 10.5 K/uL 5.0  5.2 6.9  Hemoglobin 12.0 - 15.0 g/dL 12.3 13.2 13.3  Hematocrit 36.0 - 46.0 % 35.7(L) 39.0 39.6  Platelets 150 - 400 K/uL 213 222 255     CMP Latest Ref Rng & Units 09/04/2019 06/06/2019 08/01/2018  Glucose 70 - 99 mg/dL 88 137(H) 90  BUN 6 - 20 mg/dL 11 13 10   Creatinine 0.44 - 1.00 mg/dL 0.80 0.86 0.83  Sodium 135 - 145 mmol/L 139 137 139  Potassium 3.5 - 5.1 mmol/L 4.2 4.1 4.3  Chloride 98 - 111 mmol/L 104 107 105  CO2 22 - 32 mmol/L 24 23 27   Calcium 8.9 - 10.3 mg/dL 8.7(L) 8.5(L) 8.9  Total Protein 6.5 - 8.1 g/dL 6.0(L) 6.2(L) 6.3(L)  Total Bilirubin 0.3 - 1.2 mg/dL 0.3 0.3 0.4  Alkaline Phos 38 - 126 U/L 51 58 62  AST 15 - 41 U/L 22 23 18   ALT 0 - 44 U/L 35 28 23      RADIOGRAPHIC STUDIES: I have personally reviewed the radiological images as listed and agreed with the findings in the report. No results found.  ASSESSMENT & PLAN:  Dawn Rogers is a 58 y.o. 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 Grade I -She was diagnosed in 07/2018. She is s/p left breast lumpectomy and adjuvant radiation.  -Givenher0.7cm tumor and low grade disease, I did notobtain Oncotype on her surgical sampledue to the high possibility of low risk of disease -I started her on anti-estrogen therapy with Tamoxifen in 11/2018, her [place] level in 11/2018 indicating she is still premenopausal. She is tolerating well with no side effects. I encouraged her to watch for weight and mood swings. She has good family support.  -We discussed the option of switching tamoxifen to aromatase inhibitor after she became postmenopausal. I will test her FSH/LH levels in 11/2019 to see if she is post-menopausal.  -She is clinically doing well. She does have mid abdominal pain from her deep breathing and lower back pain which all started recently. Lab reviewed, her CBC and CMP are within normal limits except Ca 8.7, protein 6,  albumin 3.4. Her physical exam and her 07/2019 mammogram were unremarkable except thoracic spine and RLQ tenderness. There is no clinical concern for recurrence. -Given her concern I will obtain a MRI of spine. If scan benign with mild OA, may hold Tamoxifen to see if pain improves.  -Given her lumpy breast tissue she had Breast MRI in 08/2019 for further screening. Scan was normal.  -Continue Surveillance. Next mammogram in 07/2020 -Continue Tamoxifen  -f/u in 4-5 months    2. Bone Health  -I will obtain baseline bone density scan as Aromatase inhibitor can reduce her bone density.  -Her baseline DEXA from 04/2019 is normal (T-score -0.2).  -She is currently on Calcium with Vit D. Her Calcium level at 8.7 likely due to her protein at 6 today (09/04/19). I encouraged her to increase protein in her diet. Will monitor.    3. Cervical Spine degeneration and Osteoarthritis, new back pain  -she has chronic cervical neck pain with tingling of her lower arms and hands. She sees pain specialist  -She has received epidural injection at her neck. This helped but pain has not completely gone away. She may have surgery for this.  -She recently developed mid back pain, was tenderness on physical exam, and diffuse leg pain which feels to be related to osteoarthritis.  -She has been on Gabapentin 173m 4 tabs a day.  -Will obtain spine MRI to further evaluate. She is agreeable.  -She has been taking ibuprofen and Tylenol BID for pain as needed. She also has flexeril. She [place]    PLAN: -Thoracic and Lumbar MRI w wo contrast in 1-2 weeks  -Continue Tamoxifen  -Lab and f/u in 4-5 months     No problem-specific Assessment & Plan notes found for this encounter.   Orders Placed This Encounter  Procedures  . MR THORACIC SPINE W WO CONTRAST    Standing Status:   Future    Standing Expiration Date:   11/03/2020    Order Specific Question:    GRA to provide read?    Answer:   Yes    Order Specific Question:   If indicated for the ordered procedure, I authorize the administration of contrast media per Radiology protocol    Answer:   Yes    Order Specific Question:   What is the patient's sedation requirement?    Answer:   No Sedation  Order Specific Question:   Use SRS Protocol?    Answer:   No    Order Specific Question:   Does the patient have a pacemaker or implanted devices?    Answer:   No    Order Specific Question:   Preferred imaging location?    Answer:   [facility] (table limit-350 lbs)    Order Specific Question:   Radiology Contrast Protocol - do NOT remove file path    Answer:   \\[email or web address]  . MR LUMBAR SPINE W WO CONTRAST    Standing Status:   Future    Standing Expiration Date:   11/03/2020    Order Specific Question:   If indicated for the ordered procedure, I authorize the administration of contrast media per Radiology protocol    Answer:   Yes    Order Specific Question:   What is the patient's sedation requirement?    Answer:   No Sedation    Order Specific Question:   Does the patient have a pacemaker or implanted devices?    Answer:   No    Order Specific Question:   Use SRS Protocol?    Answer:   No    Order Specific Question:   Radiology Contrast Protocol - do NOT remove file path    Answer:   \\[email or web address]    Order Specific Question:   Preferred imaging location?    Answer:   [facility] (table limit-350 lbs)   All questions were answered. The patient knows to call the clinic with any problems, questions or concerns. No barriers to learning was detected. I spent 20 minutes counseling the patient face to face. The total time spent in the appointment was 25 minutes and more than 50% was on counseling and review of test results     Truitt Merle, MD 09/04/2019   I, Joslyn Devon, am acting as scribe for Truitt Merle, MD.    I have reviewed the above documentation for accuracy and completeness, and I agree with the above.

## 2019-09-04 ENCOUNTER — Encounter: Payer: Self-pay | Admitting: Hematology

## 2019-09-04 ENCOUNTER — Inpatient Hospital Stay (HOSPITAL_BASED_OUTPATIENT_CLINIC_OR_DEPARTMENT_OTHER): Payer: BC Managed Care – PPO | Admitting: Hematology

## 2019-09-04 ENCOUNTER — Other Ambulatory Visit: Payer: Self-pay

## 2019-09-04 ENCOUNTER — Inpatient Hospital Stay: Payer: BC Managed Care – PPO | Attending: Hematology

## 2019-09-04 ENCOUNTER — Telehealth: Payer: Self-pay | Admitting: Hematology

## 2019-09-04 VITALS — BP 138/82 | HR 69 | Temp 98.2°F | Resp 18 | Ht 64.0 in | Wt 162.5 lb

## 2019-09-04 DIAGNOSIS — M542 Cervicalgia: Secondary | ICD-10-CM | POA: Diagnosis not present

## 2019-09-04 DIAGNOSIS — R202 Paresthesia of skin: Secondary | ICD-10-CM | POA: Insufficient documentation

## 2019-09-04 DIAGNOSIS — E039 Hypothyroidism, unspecified: Secondary | ICD-10-CM | POA: Diagnosis not present

## 2019-09-04 DIAGNOSIS — C50212 Malignant neoplasm of upper-inner quadrant of left female breast: Secondary | ICD-10-CM | POA: Diagnosis not present

## 2019-09-04 DIAGNOSIS — Z170 Estrogen receptor positive status [ER+]: Secondary | ICD-10-CM

## 2019-09-04 DIAGNOSIS — M1990 Unspecified osteoarthritis, unspecified site: Secondary | ICD-10-CM | POA: Diagnosis not present

## 2019-09-04 DIAGNOSIS — Z923 Personal history of irradiation: Secondary | ICD-10-CM | POA: Diagnosis not present

## 2019-09-04 DIAGNOSIS — Z79810 Long term (current) use of selective estrogen receptor modulators (SERMs): Secondary | ICD-10-CM | POA: Insufficient documentation

## 2019-09-04 DIAGNOSIS — K219 Gastro-esophageal reflux disease without esophagitis: Secondary | ICD-10-CM | POA: Diagnosis not present

## 2019-09-04 DIAGNOSIS — F419 Anxiety disorder, unspecified: Secondary | ICD-10-CM | POA: Insufficient documentation

## 2019-09-04 DIAGNOSIS — G8929 Other chronic pain: Secondary | ICD-10-CM | POA: Insufficient documentation

## 2019-09-04 DIAGNOSIS — Z79899 Other long term (current) drug therapy: Secondary | ICD-10-CM | POA: Diagnosis not present

## 2019-09-04 DIAGNOSIS — M546 Pain in thoracic spine: Secondary | ICD-10-CM | POA: Diagnosis not present

## 2019-09-04 LAB — CMP (CANCER CENTER ONLY)
ALT: 35 U/L (ref 0–44)
AST: 22 U/L (ref 15–41)
Albumin: 3.4 g/dL — ABNORMAL LOW (ref 3.5–5.0)
Alkaline Phosphatase: 51 U/L (ref 38–126)
Anion gap: 11 (ref 5–15)
BUN: 11 mg/dL (ref 6–20)
CO2: 24 mmol/L (ref 22–32)
Calcium: 8.7 mg/dL — ABNORMAL LOW (ref 8.9–10.3)
Chloride: 104 mmol/L (ref 98–111)
Creatinine: 0.8 mg/dL (ref 0.44–1.00)
GFR, Est AFR Am: >60 mL/min (ref >60)
GFR, Estimated: >60 mL/min (ref >60)
Glucose, Bld: 88 mg/dL (ref 70–99)
Potassium: 4.2 mmol/L (ref 3.5–5.1)
Sodium: 139 mmol/L (ref 135–145)
Total Bilirubin: 0.3 mg/dL (ref 0.3–1.2)
Total Protein: 6 g/dL — ABNORMAL LOW (ref 6.5–8.1)

## 2019-09-04 LAB — CBC WITH DIFFERENTIAL (CANCER CENTER ONLY)
Abs Immature Granulocytes: 0.01 10*3/uL (ref 0.00–0.07)
Basophils Absolute: 0 10*3/uL (ref 0.0–0.1)
Basophils Relative: 0 %
Eosinophils Absolute: 0.2 10*3/uL (ref 0.0–0.5)
Eosinophils Relative: 4 %
HCT: 35.7 % — ABNORMAL LOW (ref 36.0–46.0)
Hemoglobin: 12.3 g/dL (ref 12.0–15.0)
Immature Granulocytes: 0 %
Lymphocytes Relative: 35 %
Lymphs Abs: 1.7 10*3/uL (ref 0.7–4.0)
MCH: 33 pg (ref 26.0–34.0)
MCHC: 34.5 g/dL (ref 30.0–36.0)
MCV: 95.7 fL (ref 80.0–100.0)
Monocytes Absolute: 0.6 10*3/uL (ref 0.1–1.0)
Monocytes Relative: 11 %
Neutro Abs: 2.5 10*3/uL (ref 1.7–7.7)
Neutrophils Relative %: 50 %
Platelet Count: 213 10*3/uL (ref 150–400)
RBC: 3.73 MIL/uL — ABNORMAL LOW (ref 3.87–5.11)
RDW: 11.8 % (ref 11.5–15.5)
WBC Count: 5 10*3/uL (ref 4.0–10.5)
nRBC: 0 % (ref 0.0–0.2)

## 2019-09-04 NOTE — Telephone Encounter (Signed)
Scheduled appt per 10/28 los.  Spoke with pt and she is aware of the appt date and time.

## 2019-09-08 ENCOUNTER — Encounter: Payer: Self-pay | Admitting: Hematology

## 2019-09-09 ENCOUNTER — Other Ambulatory Visit: Payer: Self-pay

## 2019-09-18 ENCOUNTER — Ambulatory Visit (HOSPITAL_COMMUNITY)
Admission: RE | Admit: 2019-09-18 | Discharge: 2019-09-18 | Disposition: A | Discharge status: Home / Self Care | Payer: BC Managed Care – PPO | Source: Ambulatory Visit | Attending: Hematology | Admitting: Hematology

## 2019-09-18 ENCOUNTER — Other Ambulatory Visit: Payer: Self-pay

## 2019-09-18 DIAGNOSIS — Z170 Estrogen receptor positive status [ER+]: Secondary | ICD-10-CM | POA: Diagnosis present

## 2019-09-18 DIAGNOSIS — C50212 Malignant neoplasm of upper-inner quadrant of left female breast: Secondary | ICD-10-CM | POA: Insufficient documentation

## 2019-09-18 MED ORDER — GADOBUTROL 1 MMOL/ML IV SOLN
7.0000 mL | Freq: Once | INTRAVENOUS | Status: AC | PRN
  Administered 2019-09-18: 7 mL via INTRAVENOUS

## 2019-09-23 ENCOUNTER — Telehealth: Payer: Self-pay | Admitting: Hematology

## 2019-09-23 NOTE — Telephone Encounter (Signed)
I called patient, and discussed her spine MRI results, which showed no evidence of metastatic disease, scan showed moderate degenerative changes of the facet joint at L3 through S1.  She is seeing a neurologist tomorrow for her neck pain, and will review the MRI with her neurologist also.  She complains of diffuse joint pain, sometimes it is quite.  She is wondering if she can come off tamoxifen and see if her joint pain improves.  She had a very early stage disease, low-grade, risk of recurrence is very low.  I will stop her tamoxifen for at least 3 months, to see if her diffuse arthralgia improves.  She will call me in 3 months, and decide if she will resume tamoxifen.  Truitt Merle  09/23/2019

## 2020-01-02 ENCOUNTER — Telehealth: Payer: Self-pay

## 2020-01-02 NOTE — Telephone Encounter (Signed)
TC from Pt stating the site where she had her lumpectomy a year ago is red and warm. Pt stated she received some cream from radiation. Return call to Pt Left VM message to return call to Dr. Ernestina Penna office.

## 2020-01-03 ENCOUNTER — Telehealth: Payer: Self-pay

## 2020-01-03 NOTE — Telephone Encounter (Signed)
TC from Pt stating the area where she had her lumpectomy a year ago feels warm and is red she stated she had some cream from when she had radiation and put that on the sited Spoke with Pt. She stated that after she put the cream on the site looks better. Asked her would she like to come in and have some look at the site before her appointment on 01/13/20. Pt. Stated she feels ok to wait until the appointment. Also informed her that if site gets worse to call back and we will schedule an appointment for her. Pt verbalized understanding. No further problems or concerns noted.

## 2020-01-06 NOTE — Progress Notes (Signed)
Dawn Rogers   [phone] [phone]   Clinic Follow up Note   Patient Care Team: Blair Heys, PA-C as PCP - General (Physician Assistant) Fanny Skates, MD as Consulting Physician (General Surgery) Truitt Merle, MD as Consulting Physician (Hematology) Gery Pray, MD as Consulting Physician (Radiation Oncology) Alla Feeling, NP as Nurse Practitioner (Nurse Practitioner)  Date of Service:  01/13/2020  CHIEF COMPLAINT: F/u of left breast cancer  SUMMARY OF ONCOLOGIC HISTORY: Oncology History Overview Note  Cancer Staging Malignant neoplasm of upper-inner quadrant of left breast in female, estrogen receptor positive ([place]) Staging form: Breast, AJCC 8th Edition - Clinical stage from 07/23/2018: Stage IA (cT1b, cN0, cM0, G1, ER+, PR+, HER2-) - Signed by Truitt Merle, MD on 08/01/2018 - Pathologic stage from 08/31/2018: Stage IA (pT1b, pN0, cM0, G1, ER+, PR+, HER2-) - Signed by Truitt Merle, MD on 11/26/2018     Malignant neoplasm of upper-inner quadrant of left breast in female, estrogen receptor positive ([place])  07/13/2018 Mammogram   07/13/2018 Screening Mammogram IMPRESSION: Further evaluation is suggested for possible distortion in the left breast   07/19/2018 Breast US    07/19/2018 Breast US IMPRESSION: 0.6 cm irregular mass with associated architectural distortion in the 9:30 position of the left breast 4 cm from the nipple. Findings are suspicious for malignancy.  Negative for left axillary lymphadenopathy   07/19/2018 Mammogram   07/19/2018 Diagnostic Mammogram IMPRESSION: 0.6 cm irregular mass with associated architectural distortion in the 9:30 position of the left breast 4 cm from the nipple. Findings are suspicious for malignancy.  Negative for left axillary lymphadenopathy.   07/23/2018 Cancer Staging   Staging form: Breast, AJCC 8th Edition - Clinical stage from 07/23/2018: Stage IA (cT1b, cN0, cM0, G1, ER+, PR+, HER2-) - Signed by  Truitt Merle, MD on 08/01/2018   07/23/2018 Receptors her2   ADDITIONAL INFORMATION: PROGNOSTIC INDICATORS Results: IMMUNOHISTOCHEMICAL AND MORPHOMETRIC ANALYSIS PERFORMED MANUALLY The tumor cells are Negative for Her2 (1+). Estrogen Receptor: 90%, POSITIVE, STRONG STAINING INTENSITY Progesterone Receptor: 90%, POSITIVE, STRONG STAINING INTENSITY Proliferation Marker Ki67: 10%   07/23/2018 Pathology Results   Diagnosis Breast, left, needle core biopsy, 9:30 o'clock, 4 cm fn - INVASIVE DUCTAL CARCINOMA, GRADE I. SEE NOTE.   07/27/2018 Initial Diagnosis   Malignant neoplasm of upper-inner quadrant of left breast in female, estrogen receptor positive ([place])   08/16/2018 Genetic Testing   The Multi-Cancer Panel offered by [facility] includes sequencing and/or deletion duplication testing of the following 91 genes: AIP, ALK, APC, ATM, AXIN2, BAP1, BARD1, BLM, BMPR1A, BRCA1, BRCA2, BRIP1, BUB1B, CASR, CDC73, CDH1, CDK4, CDKN1B, CDKN1C, CDKN2A, CEBPA, CEP57, CHEK2, CTNNA1, DICER1, DIS3L2, EGFR, ENG, EPCAM, FH, FLCN, GALNT12, GATA2, GPC3, GREM1, HOXB13, HRAS, KIT, MAX, MEN1, MET, MITF, MLH1, MLH3, MSH2, MSH3, MSH6, MUTYH, NBN, NF1, NF2, NTHL1, PALB2, PDGFRA, PHOX2B, PMS2, POLD1, POLE, POT1, PRKAR1A, PTCH1, PTEN, RAD50, RAD51C, RAD51D, RB1, RECQL4, RET, RNF43, RPS20, RUNX1, SDHA, SDHAF2, SDHB, SDHC, SDHD, SMAD4, SMARCA4, SMARCB1, SMARCE1, STK11, SUFU, TERC, TERT, TMEM127, TP53, TSC1, TSC2, VHL, WRN, WT1  Results: Negative, no pathogenic variants identified.  The date of this test report is 08/16/2018.    08/31/2018 Surgery   LEFT DOUBLE BRACKETED LUMPECTOMY WITH RADIOACTIVE SEED X'S 2, LEFT SENTINEL LYMPH NODE BIOPSY, INJECT BLUE DYE LEFT BREAST by Dr. Dalbert Batman 08/31/18   08/31/2018 Pathology Results   Diagnosis 08/31/18  1. Breast, lumpectomy, left - INVASIVE DUCTAL CARCINOMA, [place] GRADE 1 OF 3, 0.7 CM - CALCIFICATIONS ASSOCIATED WITH CARCINOMA - MARGINS UNINVOLVED BY CARCINOMA (0.5  CM; POSTERIOR  MARGIN) - COLUMNAR CELL AND FIBROCYSTIC CHANGES INCLUDING APOCRINE METAPLASIA - PSEUDOANGIOMATOUS STROMAL HYPERPLASIA - PREVIOUS BIOPSY SITE CHANGES PRESENT - SEE ONCOLOGY TABLE AND COMMENT BELOW 2. Lymph node, sentinel, biopsy, left axillary #1 - NO CARCINOMA IDENTIFIED IN ONE LYMPH NODE (0/1) 3. Lymph node, sentinel, biopsy, left axillary #2 - NO CARCINOMA IDENTIFIED IN ONE LYMPH NODE (0/1) 4. Lymph node, sentinel, biopsy, left axillary #3 - NO CARCINOMA IDENTIFIED IN ONE LYMPH NODE (0/1)   08/31/2018 Cancer Staging   Staging form: Breast, AJCC 8th Edition - Pathologic stage from 08/31/2018: Stage IA (pT1b, pN0, cM0, G1, ER+, PR+, HER2-) - Signed by Truitt Merle, MD on 11/26/2018   10/30/2018 - 11/28/2018 Radiation Therapy   Adjuvant Radiation with Dr. Earney Hamburg 10/30/18-11/28/18   11/2018 -  Anti-estrogen oral therapy   Tamoxifen 69m daily starting 11/2018   06/06/2019 Survivorship   [name]        CURRENT THERAPY:  Tamoxifen 266mdaily started1/2020. Stopped in 09/2019 due to arthritis.   INTERVAL HISTORY:  Dawn Rogers here for a follow up and treatment. She presents to the clinic alone. She notes she had neck surgery in 09/2019 for her pinched nerve so her pain has improved and no longer on Gabapentin. She notes she stopped Tamoxifen in 09/2019. She notes her arthritis improved but some days will have flares. . She notes she has been able to lose weight on weight watchers. She notes sacral and sciatic pain can cause her to have difficulty getting up after sitting down for a while. She notes 1 episode of skin erythema and warm to tough of her left chest. This lasted for 24-48 hours. She notes she has high copay of $90 to see specialist and is fine to spread out visits.  She notes having to deal with family stress lately.     REVIEW OF SYSTEMS:   Constitutional: Denies fevers, chills or abnormal weight loss Eyes: Denies blurriness of vision Ears, nose, mouth,  throat, and face: Denies mucositis or sore throat Respiratory: Denies cough, dyspnea or wheezes Cardiovascular: Denies palpitation, chest discomfort or lower extremity swelling Gastrointestinal:  Denies nausea, heartburn or change in bowel habits Skin: Denies abnormal skin rashes MSK: (+) Arthritis  Lymphatics: Denies new lymphadenopathy or easy bruising Neurological:Denies numbness, tingling or new weaknesses (+) Sacral and sciatica pain (+) Neck pain overall resolved.  Behavioral/Psych: Mood is stable, no new changes  All other systems were reviewed with the patient and are negative.  MEDICAL HISTORY:  Past Medical History:  Diagnosis Date  . Anxiety   . [place]   left breast cancer  . Family history of breast cancer   . Family history of esophageal cancer   . Family history of lymphoma   . Family history of ovarian cancer   . GERD (gastroesophageal reflux disease)   . Hypothyroidism   . Neck pain   . Personal history of radiation therapy     SURGICAL HISTORY: Past Surgical History:  Procedure Laterality Date  . ANKLE FRACTURE SURGERY Left   . BREAST LUMPECTOMY Left 08/2018  . BREAST LUMPECTOMY WITH RADIOACTIVE SEED AND SENTINEL LYMPH NODE BIOPSY Left 08/31/2018   Procedure: LEFT DOUBLE BRACKETED LUMPECTOMY WITH RADIOACTIVE SEED X'S 2, LEFT SENTINEL LYMPH NODE BIOPSY, INJECT BLUE DYE LEFT BREAST;  Surgeon: InFanny SkatesMD;  [facility]: General;  Laterality: Left;  . CHOLECYSTECTOMY    . TUBAL LIGATION  I have reviewed the social history and family history with the patient and they are unchanged from previous note.  ALLERGIES:  has No Known Allergies.  MEDICATIONS:  Current Outpatient Medications  Medication Sig Dispense Refill  . cyclobenzaprine (FLEXERIL) 5 MG tablet Take 5 mg by mouth 3 (three) times daily as needed for muscle spasms.    . ergocalciferol (VITAMIN D2) 50000 units capsule Take 50,000 Units by  mouth once a week.    . Ibuprofen-Famotidine (DUEXIS) 800-26.6 MG TABS Take by mouth.    . levothyroxine (SYNTHROID, LEVOTHROID) 25 MCG tablet Take 25 mcg by mouth daily before breakfast.    . LORazepam (ATIVAN) 0.5 MG tablet Take 0.5 mg by mouth every 6 (six) hours as needed for anxiety.    . meloxicam (MOBIC) 7.5 MG tablet Take 7.5 mg by mouth daily.    Marland Kitchen omeprazole (PRILOSEC) 20 MG capsule Take 20 mg by mouth daily.     No current facility-administered medications for this visit.    PHYSICAL EXAMINATION: ECOG PERFORMANCE STATUS: 1 - Symptomatic but completely ambulatory  Vitals:   01/13/20 1055  BP: 136/83  Pulse: 76  Resp: 17  Temp: 98.1 F (36.7 C)  SpO2: 99%   Filed Weights   01/13/20 1055  Weight: 147 lb 4.8 oz (66.8 kg)    GENERAL:alert, no distress and comfortable SKIN: skin color, texture, turgor are normal, no rashes or significant lesions EYES: normal, Conjunctiva are pink and non-injected, sclera clear  NECK: supple, thyroid normal size, non-tender, without nodularity LYMPH:  no palpable lymphadenopathy in the cervical, axillary  LUNGS: clear to auscultation and percussion with normal breathing effort HEART: regular rate & rhythm and no murmurs and no lower extremity edema ABDOMEN:abdomen soft, non-tender and normal bowel sounds Musculoskeletal:no cyanosis of digits and no clubbing  NEURO: alert & oriented x 3 with fluent speech, no focal motor/sensory deficits BREAST: S/p left lumpectomy: Surgical incision healed well with scar tissue around nipple (+) Skin hyperpigmentation from RT. (+) Very mild left breast lymphedema. Right Breast exam benign.   LABORATORY DATA:  I have reviewed the data as listed CBC Latest Ref Rng & Units 01/13/2020 09/04/2019 06/06/2019  WBC 4.0 - 10.5 K/uL 5.3 5.0 5.2  Hemoglobin 12.0 - 15.0 g/dL 14.4 12.3 13.2  Hematocrit 36.0 - 46.0 % 41.4 35.7(L) 39.0  Platelets 150 - 400 K/uL 238 213 222     CMP Latest Ref Rng & Units 01/13/2020  09/04/2019 06/06/2019  Glucose 70 - 99 mg/dL 98 88 137(H)  BUN 6 - 20 mg/dL 13 11 13   Creatinine 0.44 - 1.00 mg/dL 0.84 0.80 0.86  Sodium 135 - 145 mmol/L 139 139 137  Potassium 3.5 - 5.1 mmol/L 4.0 4.2 4.1  Chloride 98 - 111 mmol/L 106 104 107  CO2 22 - 32 mmol/L 24 24 23   Calcium 8.9 - 10.3 mg/dL 9.5 8.7(L) 8.5(L)  Total Protein 6.5 - 8.1 g/dL 6.5 6.0(L) 6.2(L)  Total Bilirubin 0.3 - 1.2 mg/dL 0.4 0.3 0.3  Alkaline Phos 38 - 126 U/L 66 51 58  AST 15 - 41 U/L 26 22 23   ALT 0 - 44 U/L 35 35 28      RADIOGRAPHIC STUDIES: I have personally reviewed the radiological images as listed and agreed with the findings in the report. No results found.   ASSESSMENT & PLAN:  Dawn Rogers is a 58 y.o. female with    1.Malignant Neoplasm of Upper-inner Quadrant of Left Breast,Stage IA(pT1b,pN0, cM0). ER 90%,PR 90%,HER2 (-),  Ki67 10%. Grade I -She was diagnosed in 07/2018. She is s/p left breast lumpectomyand adjuvantradiation. -Givenher0.7cm tumor and low grade disease, I did notobtain Oncotype on her surgical sampledue to the high possibility of low risk of disease -I started her on anti-estrogen therapy with Tamoxifen in1/2020, her Lerna level in 11/2018 indicating she is still premenopausal. She is tolerating well with no side effects.I encouraged her to watch for weight and mood swings. She has good family support.  -We discussed the option of switching tamoxifen to aromatase inhibitor after she became postmenopausal.I will test her FSH/LH levels in 2021.  -She stopped Tamoxifen in 09/2019 due to arthritis. Arthritis has improved some overall. I reviewed her risk of recurrence and benefit of Tamoxifen with her and discussed restarting in the next 1-2 months. She is willing to try it again and will watch for worsening arthritis.  -From a breast cancer standpoint She is clinically doing well. Lab reviewed, her CBC and CMP are within normal limits. Her physical exam and her 07/2019  mammogram were unremarkable except mild left breast skin hyperpigmentation and mild Lymphedema.There is no clinical concern for recurrence. -I recommend she stretch her left arm and do self massages to help her lymphedema.  -Continue Surveillance. Next mammogram in 07/2020 -Phone visit in 4 months and OV in 1 year due to high copay. She will see her new surgeon in interim.    2. Bone Health  -I will obtain baseline bone density scan as Aromatase inhibitor can reduce her bone density. -Her baseline DEXA from 04/2019 is normal (T-score -0.2).  -She is currently on Calcium with Vit D. Her Calcium level at 8.7 likely due to her protein at 6 today (09/04/19). I encouraged her to increase protein in her diet. Will monitor.    3. Cervical Spine degenerationand Osteoarthritis,  -Her cervical neck pain resolved after her cervical surgery in 09/2019. No longer on Gabapentin.  -She notes since stopping Tamoxifen her OA did improve.  -Her 09/18/19 spine MRI showed no evidence of metastatic disease, scan showed moderate degenerative changes of the facet joint at L3 through S1.   -Her main pain is sacral and sciatic pain which has recently presented. I recommend she f/u with her orthopedist and neurologist about this and treatment.     PLAN:  -Mammogram in 07/2020  -Phone visit in 4 months  -Lab and F/u in 1 year due to her high copay for office visit    No problem-specific Assessment & Plan notes found for this encounter.   Orders Placed This Encounter  Procedures  . MM DIAG BREAST TOMO BILATERAL    Standing Status:   Future    Standing Expiration Date:   01/12/2021    Order Specific Question:   Reason for Exam (SYMPTOM  OR DIAGNOSIS REQUIRED)    Answer:   screening    Order Specific Question:   Is the patient pregnant?    Answer:   No    Order Specific Question:   Preferred imaging location?    Answer:   [facility]   All questions were answered. The patient knows to call the  clinic with any problems, questions or concerns. No barriers to learning was detected. The total time spent in the appointment was 25 minutes.     Truitt Merle, MD 01/13/2020   I, Joslyn Devon, am acting as scribe for Truitt Merle, MD.   I have reviewed the above documentation for accuracy and completeness, and I agree with the above.

## 2020-01-13 ENCOUNTER — Inpatient Hospital Stay: Payer: BC Managed Care – PPO | Attending: Hematology

## 2020-01-13 ENCOUNTER — Other Ambulatory Visit: Payer: Self-pay

## 2020-01-13 ENCOUNTER — Encounter: Payer: Self-pay | Admitting: Hematology

## 2020-01-13 ENCOUNTER — Inpatient Hospital Stay: Payer: BC Managed Care – PPO | Admitting: Hematology

## 2020-01-13 VITALS — BP 136/83 | HR 76 | Temp 98.1°F | Resp 17 | Ht 64.0 in | Wt 147.3 lb

## 2020-01-13 DIAGNOSIS — C50212 Malignant neoplasm of upper-inner quadrant of left female breast: Secondary | ICD-10-CM | POA: Insufficient documentation

## 2020-01-13 DIAGNOSIS — Z170 Estrogen receptor positive status [ER+]: Secondary | ICD-10-CM

## 2020-01-13 DIAGNOSIS — Z923 Personal history of irradiation: Secondary | ICD-10-CM | POA: Diagnosis not present

## 2020-01-13 DIAGNOSIS — M1990 Unspecified osteoarthritis, unspecified site: Secondary | ICD-10-CM | POA: Diagnosis not present

## 2020-01-13 LAB — CBC WITH DIFFERENTIAL (CANCER CENTER ONLY)
Abs Immature Granulocytes: 0.01 10*3/uL (ref 0.00–0.07)
Basophils Absolute: 0 10*3/uL (ref 0.0–0.1)
Basophils Relative: 1 %
Eosinophils Absolute: 0.3 10*3/uL (ref 0.0–0.5)
Eosinophils Relative: 6 %
HCT: 41.4 % (ref 36.0–46.0)
Hemoglobin: 14.4 g/dL (ref 12.0–15.0)
Immature Granulocytes: 0 %
Lymphocytes Relative: 35 %
Lymphs Abs: 1.9 10*3/uL (ref 0.7–4.0)
MCH: 32.6 pg (ref 26.0–34.0)
MCHC: 34.8 g/dL (ref 30.0–36.0)
MCV: 93.7 fL (ref 80.0–100.0)
Monocytes Absolute: 0.5 10*3/uL (ref 0.1–1.0)
Monocytes Relative: 10 %
Neutro Abs: 2.6 10*3/uL (ref 1.7–7.7)
Neutrophils Relative %: 48 %
Platelet Count: 238 10*3/uL (ref 150–400)
RBC: 4.42 MIL/uL (ref 3.87–5.11)
RDW: 11.8 % (ref 11.5–15.5)
WBC Count: 5.3 10*3/uL (ref 4.0–10.5)
nRBC: 0 % (ref 0.0–0.2)

## 2020-01-13 LAB — CMP (CANCER CENTER ONLY)
ALT: 35 U/L (ref 0–44)
AST: 26 U/L (ref 15–41)
Albumin: 3.9 g/dL (ref 3.5–5.0)
Alkaline Phosphatase: 66 U/L (ref 38–126)
Anion gap: 9 (ref 5–15)
BUN: 13 mg/dL (ref 6–20)
CO2: 24 mmol/L (ref 22–32)
Calcium: 9.5 mg/dL (ref 8.9–10.3)
Chloride: 106 mmol/L (ref 98–111)
Creatinine: 0.84 mg/dL (ref 0.44–1.00)
GFR, Est AFR Am: >60 mL/min (ref >60)
GFR, Estimated: >60 mL/min (ref >60)
Glucose, Bld: 98 mg/dL (ref 70–99)
Potassium: 4 mmol/L (ref 3.5–5.1)
Sodium: 139 mmol/L (ref 135–145)
Total Bilirubin: 0.4 mg/dL (ref 0.3–1.2)
Total Protein: 6.5 g/dL (ref 6.5–8.1)

## 2020-01-14 ENCOUNTER — Telehealth: Payer: Self-pay | Admitting: Hematology

## 2020-01-14 NOTE — Telephone Encounter (Signed)
Scheduled appt per 3/8 los.  Spoke with pt and they are aware of the appt date and time.

## 2020-02-13 ENCOUNTER — Other Ambulatory Visit: Payer: BC Managed Care – PPO

## 2020-02-13 ENCOUNTER — Ambulatory Visit: Payer: BC Managed Care – PPO | Admitting: Nurse Practitioner

## 2020-05-08 NOTE — Progress Notes (Signed)
[name]   [phone] [phone]   Clinic Follow up Note   Patient Care Team: Blair Heys, PA-C as PCP - General (Physician Assistant) Fanny Skates, MD as Consulting Physician (General Surgery) Truitt Merle, MD as Consulting Physician (Hematology) Gery Pray, MD as Consulting Physician (Radiation Oncology) Alla Feeling, NP as Nurse Practitioner (Nurse Practitioner)   I connected with [name] on 05/15/2020 at 11:20 AM EDT by telephone visit and verified that I am speaking with the correct person using two identifiers.  I discussed the limitations, risks, security and privacy concerns of performing an evaluation and management service by telephone and the availability of in person appointments. I also discussed with the patient that there may be a patient responsible charge related to this service. The patient expressed understanding and agreed to proceed.   Other persons participating in the visit and their role in the encounter:  None   Patient's location:  Her home  Provider's location:  My Office   CHIEF COMPLAINT: F/u of left breast cancer   SUMMARY OF ONCOLOGIC HISTORY: Oncology History Overview Note  Cancer Staging Malignant neoplasm of upper-inner quadrant of left breast in female, estrogen receptor positive ([place]) Staging form: Breast, AJCC 8th Edition - Clinical stage from 07/23/2018: Stage IA (cT1b, cN0, cM0, G1, ER+, PR+, HER2-) - Signed by Truitt Merle, MD on 08/01/2018 - Pathologic stage from 08/31/2018: Stage IA (pT1b, pN0, cM0, G1, ER+, PR+, HER2-) - Signed by Truitt Merle, MD on 11/26/2018     Malignant neoplasm of upper-inner quadrant of left breast in female, estrogen receptor positive ([place])  07/13/2018 Mammogram   07/13/2018 Screening Mammogram IMPRESSION: Further evaluation is suggested for possible distortion in the left breast   07/19/2018 Breast US    07/19/2018 Breast US IMPRESSION: 0.6 cm irregular mass with  associated architectural distortion in the 9:30 position of the left breast 4 cm from the nipple. Findings are suspicious for malignancy.  Negative for left axillary lymphadenopathy   07/19/2018 Mammogram   07/19/2018 Diagnostic Mammogram IMPRESSION: 0.6 cm irregular mass with associated architectural distortion in the 9:30 position of the left breast 4 cm from the nipple. Findings are suspicious for malignancy.  Negative for left axillary lymphadenopathy.   07/23/2018 Cancer Staging   Staging form: Breast, AJCC 8th Edition - Clinical stage from 07/23/2018: Stage IA (cT1b, cN0, cM0, G1, ER+, PR+, HER2-) - Signed by Truitt Merle, MD on 08/01/2018   07/23/2018 Receptors her2   ADDITIONAL INFORMATION: PROGNOSTIC INDICATORS Results: IMMUNOHISTOCHEMICAL AND MORPHOMETRIC ANALYSIS PERFORMED MANUALLY The tumor cells are Negative for Her2 (1+). Estrogen Receptor: 90%, POSITIVE, STRONG STAINING INTENSITY Progesterone Receptor: 90%, POSITIVE, STRONG STAINING INTENSITY Proliferation Marker Ki67: 10%   07/23/2018 Pathology Results   Diagnosis Breast, left, needle core biopsy, 9:30 o'clock, 4 cm fn - INVASIVE DUCTAL CARCINOMA, GRADE I. SEE NOTE.   07/27/2018 Initial Diagnosis   Malignant neoplasm of upper-inner quadrant of left breast in female, estrogen receptor positive ([place])   08/16/2018 Genetic Testing   The Multi-Cancer Panel offered by [facility] includes sequencing and/or deletion duplication testing of the following 91 genes: AIP, ALK, APC, ATM, AXIN2, BAP1, BARD1, BLM, BMPR1A, BRCA1, BRCA2, BRIP1, BUB1B, CASR, CDC73, CDH1, CDK4, CDKN1B, CDKN1C, CDKN2A, CEBPA, CEP57, CHEK2, CTNNA1, DICER1, DIS3L2, EGFR, ENG, EPCAM, FH, FLCN, GALNT12, GATA2, GPC3, GREM1, HOXB13, HRAS, KIT, MAX, MEN1, MET, MITF, MLH1, MLH3, MSH2, MSH3, MSH6, MUTYH, NBN, NF1, NF2, NTHL1, PALB2, PDGFRA, PHOX2B, PMS2, POLD1, POLE, POT1, PRKAR1A, PTCH1, PTEN, RAD50, RAD51C, RAD51D, RB1,  RECQL4, RET, RNF43, RPS20, RUNX1, SDHA, SDHAF2,  SDHB, SDHC, SDHD, SMAD4, SMARCA4, SMARCB1, SMARCE1, STK11, SUFU, TERC, TERT, TMEM127, TP53, TSC1, TSC2, VHL, WRN, WT1  Results: Negative, no pathogenic variants identified.  The date of this test report is 08/16/2018.    08/31/2018 Surgery   LEFT DOUBLE BRACKETED LUMPECTOMY WITH RADIOACTIVE SEED X'S 2, LEFT SENTINEL LYMPH NODE BIOPSY, INJECT BLUE DYE LEFT BREAST by Dr. Dalbert Batman 08/31/18   08/31/2018 Pathology Results   Diagnosis 08/31/18  1. Breast, lumpectomy, left - INVASIVE DUCTAL CARCINOMA, [place] GRADE 1 OF 3, 0.7 CM - CALCIFICATIONS ASSOCIATED WITH CARCINOMA - MARGINS UNINVOLVED BY CARCINOMA (0.5 CM; POSTERIOR MARGIN) - COLUMNAR CELL AND FIBROCYSTIC CHANGES INCLUDING APOCRINE METAPLASIA - PSEUDOANGIOMATOUS STROMAL HYPERPLASIA - PREVIOUS BIOPSY SITE CHANGES PRESENT - SEE ONCOLOGY TABLE AND COMMENT BELOW 2. Lymph node, sentinel, biopsy, left axillary #1 - NO CARCINOMA IDENTIFIED IN ONE LYMPH NODE (0/1) 3. Lymph node, sentinel, biopsy, left axillary #2 - NO CARCINOMA IDENTIFIED IN ONE LYMPH NODE (0/1) 4. Lymph node, sentinel, biopsy, left axillary #3 - NO CARCINOMA IDENTIFIED IN ONE LYMPH NODE (0/1)   08/31/2018 Cancer Staging   Staging form: Breast, AJCC 8th Edition - Pathologic stage from 08/31/2018: Stage IA (pT1b, pN0, cM0, G1, ER+, PR+, HER2-) - Signed by Truitt Merle, MD on 11/26/2018   10/30/2018 - 11/28/2018 Radiation Therapy   Adjuvant Radiation with Dr. Earney Hamburg 10/30/18-11/28/18   11/2018 - 09/2019 Anti-estrogen oral therapy   Tamoxifen 31m daily started 11/2018. Stopped in 09/2019 due to arthritis. She opted to not restart.    06/06/2019 Survivorship   [name]        CURRENT THERAPY:  Surveillance   INTERVAL HISTORY:  Dawn HASTENis here for a follow up of left breast cancer. She notes her mid and back pain was seen to be arthritis on her scans. She notes her pain has spread to her tailbone. She tries not to sit because this exacerbates it. She has  been sitting on doughnut pillows due to the swelling from this. She notes she did not restart Tamoxifen due to dizziness when on it and her concerns for endometrial cancer.    REVIEW OF SYSTEMS:   Constitutional: Denies fevers, chills or abnormal weight loss Eyes: Denies blurriness of vision Ears, nose, mouth, throat, and face: Denies mucositis or sore throat Respiratory: Denies cough, dyspnea or wheezes Cardiovascular: Denies palpitation, chest discomfort or lower extremity swelling Gastrointestinal:  Denies nausea, heartburn or change in bowel habits Skin: Denies abnormal skin rashes MSK: (+) Back pain radiating to lower back  Lymphatics: Denies new lymphadenopathy or easy bruising Neurological:Denies numbness, tingling or new weaknesses Behavioral/Psych: Mood is stable, no new changes  All other systems were reviewed with the patient and are negative.  MEDICAL HISTORY:  Past Medical History:  Diagnosis Date  . Anxiety   . Cancer ([place] 08/02/2018   left breast cancer  . Family history of breast cancer   . Family history of esophageal cancer   . Family history of lymphoma   . Family history of ovarian cancer   . GERD (gastroesophageal reflux disease)   . Hypothyroidism   . Neck pain   . Personal history of radiation therapy     SURGICAL HISTORY: Past Surgical History:  Procedure Laterality Date  . ANKLE FRACTURE SURGERY Left   . BREAST LUMPECTOMY Left 08/2018  . BREAST LUMPECTOMY WITH RADIOACTIVE SEED AND SENTINEL LYMPH NODE BIOPSY Left 08/31/2018   Procedure: LEFT DOUBLE BRACKETED LUMPECTOMY WITH RADIOACTIVE  SEED X'S 2, LEFT SENTINEL LYMPH NODE BIOPSY, INJECT BLUE DYE LEFT BREAST;  Surgeon: Fanny Skates, MD;  Location: [facility];  Service: General;  Laterality: Left;  . CHOLECYSTECTOMY    . TUBAL LIGATION      I have reviewed the social history and family history with the patient and they are unchanged from previous note.  ALLERGIES:  has No Known  Allergies.  MEDICATIONS:  Current Outpatient Medications  Medication Sig Dispense Refill  . cyclobenzaprine (FLEXERIL) 5 MG tablet Take 5 mg by mouth 3 (three) times daily as needed for muscle spasms.    . ergocalciferol (VITAMIN D2) 50000 units capsule Take 50,000 Units by mouth once a week.    . Ibuprofen-Famotidine (DUEXIS) 800-26.6 MG TABS Take by mouth.    . levothyroxine (SYNTHROID, LEVOTHROID) 25 MCG tablet Take 25 mcg by mouth daily before breakfast.    . LORazepam (ATIVAN) 0.5 MG tablet Take 0.5 mg by mouth every 6 (six) hours as needed for anxiety.    . meloxicam (MOBIC) 7.5 MG tablet Take 7.5 mg by mouth daily.    Marland Kitchen omeprazole (PRILOSEC) 20 MG capsule Take 20 mg by mouth daily.     No current facility-administered medications for this visit.    PHYSICAL EXAMINATION: ECOG PERFORMANCE STATUS: 0 - Asymptomatic  No vitals taken today, Exam not performed today   LABORATORY DATA:  I have reviewed the data as listed CBC Latest Ref Rng & Units 01/13/2020 09/04/2019 06/06/2019  WBC 4.0 - 10.5 K/uL 5.3 5.0 5.2  Hemoglobin 12.0 - 15.0 g/dL 14.4 12.3 13.2  Hematocrit 36 - 46 % 41.4 35.7(L) 39.0  Platelets 150 - 400 K/uL 238 213 222     CMP Latest Ref Rng & Units 01/13/2020 09/04/2019 06/06/2019  Glucose 70 - 99 mg/dL 98 88 137(H)  BUN 6 - 20 mg/dL 13 11 13   Creatinine 0.44 - 1.00 mg/dL 0.84 0.80 0.86  Sodium 135 - 145 mmol/L 139 139 137  Potassium 3.5 - 5.1 mmol/L 4.0 4.2 4.1  Chloride 98 - 111 mmol/L 106 104 107  CO2 22 - 32 mmol/L 24 24 23   Calcium 8.9 - 10.3 mg/dL 9.5 8.7(L) 8.5(L)  Total Protein 6.5 - 8.1 g/dL 6.5 6.0(L) 6.2(L)  Total Bilirubin 0.3 - 1.2 mg/dL 0.4 0.3 0.3  Alkaline Phos 38 - 126 U/L 66 51 58  AST 15 - 41 U/L 26 22 23   ALT 0 - 44 U/L 35 35 28      RADIOGRAPHIC STUDIES: I have personally reviewed the radiological images as listed and agreed with the findings in the report. No results found.   ASSESSMENT & PLAN:  Dawn Rogers is a 59 y.o. female  with    1.Malignant Neoplasm of Upper-inner Quadrant of Left Breast,Stage IA(pT1b,pN0, cM0). ER 90%,PR 90%,HER2 (-),Ki67 10%. Grade I -She was diagnosed in 07/2018. She is s/p left breast lumpectomyand adjuvantradiation. -Givenher0.7cm tumor and low grade disease, I did notobtain Oncotype on her surgical sampledue to the high possibility of low risk of disease -I started her on anti-estrogen therapy with Tamoxifen in1/2020, her [place] level in 11/2018 indicating she is still premenopausal.  -She stopped Tamoxifen in 09/2019 due to arthritis. Her arthritis has worsened off Tamoxifen so it is probably not related. She has been concerned about restarting Tamoxifen given side effects, especially risk of endometrial cancer. I reviewed her risk of recurrence and benefit of Tamoxifen with her. She voiced good understanding and opted to not restart Tamoxifen.  -  She will continue surveillance. Next Mammogram in 07/2020.  -F/u in 01/2021.   2. Bone Health  -Her baseline DEXA from 04/2019 is normal (T-score -0.2). -She is currently on Calcium with Vit D.   3. Cervical Spine degenerationandOsteoarthritis -Her cervical neck pain resolved after her cervical surgery in 09/2019. No longer on Gabapentin.  -She notes since stopping Tamoxifen her OA did improve.  -Her 09/18/19 spineMRI showed no evidence of metastatic disease,scan showed moderate degenerative changes of the facet joint at L3 throughS1. -Her main pain is sacral and sciatic pain which has recently presented. Since being off Tamoxifen her back pain has spread to her tailbone along with swelling. I recommend she f/u with her orthopedist and neurologist about this and treatment.    PLAN:  -She did not want to restart her tamoxifen -Mammogram in 07/2020  -Lab and F/u in 01/2021 scheduled    No problem-specific Assessment & Plan notes found for this encounter.   Orders Placed This Encounter  Procedures  . MM DIAG BREAST TOMO  BILATERAL    Standing Status:   Future    Standing Expiration Date:   05/15/2021    Order Specific Question:   Reason for Exam (SYMPTOM  OR DIAGNOSIS REQUIRED)    Answer:   screening    Order Specific Question:   Is the patient pregnant?    Answer:   No    Order Specific Question:   Preferred imaging location?    Answer[facility]   [facility]   I discussed the assessment and treatment plan with the patient. The patient was provided an opportunity to ask questions and all were answered. The patient agreed with the plan and demonstrated an understanding of the instructions.  The patient was advised to call back or seek an in-person evaluation if the symptoms worsen or if the condition fails to improve as anticipated.  The total time spent in the appointment was 22 minutes.    Truitt Merle, MD 05/15/2020   I, Joslyn Devon, am acting as scribe for Truitt Merle, MD.   I have reviewed the above documentation for accuracy and completeness, and I agree with the above.

## 2020-05-15 ENCOUNTER — Encounter: Payer: Self-pay | Admitting: Hematology

## 2020-05-15 ENCOUNTER — Inpatient Hospital Stay: Payer: BC Managed Care – PPO | Attending: Hematology | Admitting: Hematology

## 2020-05-15 DIAGNOSIS — C50212 Malignant neoplasm of upper-inner quadrant of left female breast: Secondary | ICD-10-CM

## 2020-05-15 DIAGNOSIS — Z170 Estrogen receptor positive status [ER+]: Secondary | ICD-10-CM | POA: Diagnosis not present

## 2020-05-18 ENCOUNTER — Telehealth: Payer: Self-pay | Admitting: Hematology

## 2020-05-18 NOTE — Telephone Encounter (Signed)
No 7/9 los

## 2020-07-20 ENCOUNTER — Ambulatory Visit
Admission: RE | Admit: 2020-07-20 | Discharge: 2020-07-20 | Disposition: A | Discharge status: Home / Self Care | Payer: BC Managed Care – PPO | Source: Ambulatory Visit | Attending: Hematology | Admitting: Hematology

## 2020-07-20 ENCOUNTER — Other Ambulatory Visit: Payer: Self-pay

## 2020-07-20 DIAGNOSIS — Z170 Estrogen receptor positive status [ER+]: Secondary | ICD-10-CM

## 2020-08-25 DIAGNOSIS — M4310 Spondylolisthesis, site unspecified: Secondary | ICD-10-CM | POA: Insufficient documentation

## 2020-08-25 DIAGNOSIS — G8929 Other chronic pain: Secondary | ICD-10-CM | POA: Insufficient documentation

## 2020-08-25 DIAGNOSIS — M542 Cervicalgia: Secondary | ICD-10-CM | POA: Insufficient documentation

## 2020-12-21 ENCOUNTER — Telehealth: Payer: Self-pay | Admitting: Nurse Practitioner

## 2020-12-21 NOTE — Telephone Encounter (Signed)
Changed provider appointment to Dr. Burr Medico per 2/14 schedule message. Patient is aware.

## 2021-01-01 ENCOUNTER — Encounter: Payer: Self-pay | Admitting: Hematology

## 2021-01-08 NOTE — Progress Notes (Signed)
[place]   [phone] [phone]   Clinic Follow up Note   Patient Care Team: Barrie Lyme as PCP - General (Physician Assistant) Fanny Skates, MD as Consulting Physician (General Surgery) Truitt Merle, MD as Consulting Physician (Hematology) Gery Pray, MD as Consulting Physician (Radiation Oncology) Alla Feeling, NP as Nurse Practitioner (Nurse Practitioner)  Date of Service:  01/13/2021  CHIEF COMPLAINT: F/u of left breast cancer   SUMMARY OF ONCOLOGIC HISTORY: Oncology History Overview Note  Cancer Staging Malignant neoplasm of upper-inner quadrant of left breast in female, estrogen receptor positive ([place]) Staging form: Breast, AJCC 8th Edition - Clinical stage from 07/23/2018: Stage IA (cT1b, cN0, cM0, G1, ER+, PR+, HER2-) - Signed by Truitt Merle, MD on 08/01/2018 - Pathologic stage from 08/31/2018: Stage IA (pT1b, pN0, cM0, G1, ER+, PR+, HER2-) - Signed by Truitt Merle, MD on 11/26/2018     Malignant neoplasm of upper-inner quadrant of left breast in female, estrogen receptor positive ([place])  07/13/2018 Mammogram   07/13/2018 Screening Mammogram IMPRESSION: Further evaluation is suggested for possible distortion in the left breast   07/19/2018 Breast US    07/19/2018 Breast US IMPRESSION: 0.6 cm irregular mass with associated architectural distortion in the 9:30 position of the left breast 4 cm from the nipple. Findings are suspicious for malignancy.  Negative for left axillary lymphadenopathy   07/19/2018 Mammogram   07/19/2018 Diagnostic Mammogram IMPRESSION: 0.6 cm irregular mass with associated architectural distortion in the 9:30 position of the left breast 4 cm from the nipple. Findings are suspicious for malignancy.  Negative for left axillary lymphadenopathy.   07/23/2018 Cancer Staging   Staging form: Breast, AJCC 8th Edition - Clinical stage from 07/23/2018: Stage IA (cT1b, cN0, cM0, G1, ER+, PR+, HER2-) -  Signed by Truitt Merle, MD on 08/01/2018   07/23/2018 Receptors her2   ADDITIONAL INFORMATION: PROGNOSTIC INDICATORS Results: IMMUNOHISTOCHEMICAL AND MORPHOMETRIC ANALYSIS PERFORMED MANUALLY The tumor cells are Negative for Her2 (1+). Estrogen Receptor: 90%, POSITIVE, STRONG STAINING INTENSITY Progesterone Receptor: 90%, POSITIVE, STRONG STAINING INTENSITY Proliferation Marker Ki67: 10%   07/23/2018 Pathology Results   Diagnosis Breast, left, needle core biopsy, 9:30 o'clock, 4 cm fn - INVASIVE DUCTAL CARCINOMA, GRADE I. SEE NOTE.   07/27/2018 Initial Diagnosis   Malignant neoplasm of upper-inner quadrant of left breast in female, estrogen receptor positive ([place])   08/16/2018 Genetic Testing   The Multi-Cancer Panel offered by [facility] includes sequencing and/or deletion duplication testing of the following 91 genes: AIP, ALK, APC, ATM, AXIN2, BAP1, BARD1, BLM, BMPR1A, BRCA1, BRCA2, BRIP1, BUB1B, CASR, CDC73, CDH1, CDK4, CDKN1B, CDKN1C, CDKN2A, CEBPA, CEP57, CHEK2, CTNNA1, DICER1, DIS3L2, EGFR, ENG, EPCAM, FH, FLCN, GALNT12, GATA2, GPC3, GREM1, HOXB13, HRAS, KIT, MAX, MEN1, MET, MITF, MLH1, MLH3, MSH2, MSH3, MSH6, MUTYH, NBN, NF1, NF2, NTHL1, PALB2, PDGFRA, PHOX2B, PMS2, POLD1, POLE, POT1, PRKAR1A, PTCH1, PTEN, RAD50, RAD51C, RAD51D, RB1, RECQL4, RET, RNF43, RPS20, RUNX1, SDHA, SDHAF2, SDHB, SDHC, SDHD, SMAD4, SMARCA4, SMARCB1, SMARCE1, STK11, SUFU, TERC, TERT, TMEM127, TP53, TSC1, TSC2, VHL, WRN, WT1  Results: Negative, no pathogenic variants identified.  The date of this test report is 08/16/2018.    08/31/2018 Surgery   LEFT DOUBLE BRACKETED LUMPECTOMY WITH RADIOACTIVE SEED X'S 2, LEFT SENTINEL LYMPH NODE BIOPSY, INJECT BLUE DYE LEFT BREAST by Dr. Dalbert Batman 08/31/18   08/31/2018 Pathology Results   Diagnosis 08/31/18  1. Breast, lumpectomy, left - INVASIVE DUCTAL CARCINOMA, [place] GRADE 1 OF 3, 0.7 CM - CALCIFICATIONS ASSOCIATED WITH CARCINOMA - MARGINS UNINVOLVED BY  CARCINOMA (0.5 CM;  POSTERIOR MARGIN) - COLUMNAR CELL AND FIBROCYSTIC CHANGES INCLUDING APOCRINE METAPLASIA - PSEUDOANGIOMATOUS STROMAL HYPERPLASIA - PREVIOUS BIOPSY SITE CHANGES PRESENT - SEE ONCOLOGY TABLE AND COMMENT BELOW 2. Lymph node, sentinel, biopsy, left axillary #1 - NO CARCINOMA IDENTIFIED IN ONE LYMPH NODE (0/1) 3. Lymph node, sentinel, biopsy, left axillary #2 - NO CARCINOMA IDENTIFIED IN ONE LYMPH NODE (0/1) 4. Lymph node, sentinel, biopsy, left axillary #3 - NO CARCINOMA IDENTIFIED IN ONE LYMPH NODE (0/1)   08/31/2018 Cancer Staging   Staging form: Breast, AJCC 8th Edition - Pathologic stage from 08/31/2018: Stage IA (pT1b, pN0, cM0, G1, ER+, PR+, HER2-) - Signed by Truitt Merle, MD on 11/26/2018   10/30/2018 - 11/28/2018 Radiation Therapy   Adjuvant Radiation with Dr. Earney Hamburg 10/30/18-11/28/18   11/2018 - 09/2019 Anti-estrogen oral therapy   Tamoxifen 6m daily started 11/2018. Stopped in 09/2019 due to arthritis. She opted to not restart.    06/06/2019 Survivorship   [name]        CURRENT THERAPY:  Surveillance   INTERVAL HISTORY:  Dawn CERRATOis here for a follow up of left breast cancer. She was last seen by me 8 months ago. She presents to the clinic alone. She notes after a few months of her sinus infection with 2 rounds of antibiotics, steroids and nasal sprays. She plans to see ENT soon. She notes during this time she had several episodes of black diarrhea which her physicians notes was from her antibiotics. She denies nausea. She notes she has been on Mobic daily. She notes her last period was 2 years ago. She notes she had colonoscopy before at least 2-3 years ago, no endoscopy. She notes she did a blood donation 3 weeks ago and her iron level was fine at that time.  She notes she is on hair, skin, nail vitamin but notes her nails are still brittle and weak.     REVIEW OF SYSTEMS:   Constitutional: Denies fevers, chills or abnormal weight loss Eyes: Denies  blurriness of vision Ears, nose, mouth, throat, and face: Denies mucositis or sore throat Respiratory: Denies cough, dyspnea or wheezes Cardiovascular: Denies palpitation, chest discomfort or lower extremity swelling Gastrointestinal:  Denies nausea, heartburn or change in bowel habits Skin: Denies abnormal skin rashes Lymphatics: Denies new lymphadenopathy or easy bruising Neurological:Denies numbness, tingling or new weaknesses Behavioral/Psych: Mood is stable, no new changes  All other systems were reviewed with the patient and are negative.  MEDICAL HISTORY:  Past Medical History:  Diagnosis Date  . Anxiety   . Cancer ([place] 08/[place]   left breast cancer  . Family history of breast cancer   . Family history of esophageal cancer   . Family history of lymphoma   . Family history of ovarian cancer   . GERD (gastroesophageal reflux disease)   . Hypothyroidism   . Neck pain   . Personal history of radiation therapy     SURGICAL HISTORY: Past Surgical History:  Procedure Laterality Date  . ANKLE FRACTURE SURGERY Left   . BREAST LUMPECTOMY Left 08/2018  . BREAST LUMPECTOMY WITH RADIOACTIVE SEED AND SENTINEL LYMPH NODE BIOPSY Left 08/31/2018   Procedure: LEFT DOUBLE BRACKETED LUMPECTOMY WITH RADIOACTIVE SEED X'S 2, LEFT SENTINEL LYMPH NODE BIOPSY, INJECT BLUE DYE LEFT BREAST;  Surgeon: IFanny Skates MD;  [facility]  Service: General;  Laterality: Left;  . CHOLECYSTECTOMY    . TUBAL LIGATION      I have reviewed  the social history and family history with the patient and they are unchanged from previous note.  ALLERGIES:  has No Known Allergies.  MEDICATIONS:  Current Outpatient Medications  Medication Sig Dispense Refill  . cyclobenzaprine (FLEXERIL) 5 MG tablet Take 5 mg by mouth 3 (three) times daily as needed for muscle spasms.    . ergocalciferol (VITAMIN D2) 50000 units capsule Take 50,000 Units by mouth once a week.    .  Ibuprofen-Famotidine (DUEXIS) 800-26.6 MG TABS Take by mouth.    . levothyroxine (SYNTHROID, LEVOTHROID) 25 MCG tablet Take 25 mcg by mouth daily before breakfast.    . LORazepam (ATIVAN) 0.5 MG tablet Take 0.5 mg by mouth every 6 (six) hours as needed for anxiety.    . meloxicam (MOBIC) 7.5 MG tablet Take 7.5 mg by mouth daily.    Marland Kitchen omeprazole (PRILOSEC) 20 MG capsule Take 20 mg by mouth daily.     No current facility-administered medications for this visit.    PHYSICAL EXAMINATION: ECOG PERFORMANCE STATUS: 1 - Symptomatic but completely ambulatory  Vitals:   01/13/21 1425  BP: 133/63  Pulse: 80  Resp: 18  Temp: 98.5 F (36.9 C)  SpO2: 100%   Filed Weights   01/13/21 1425  Weight: 136 lb (61.7 kg)    GENERAL:alert, no distress and comfortable SKIN: skin color, texture, turgor are normal, no rashes or significant lesions EYES: normal, Conjunctiva are pink and non-injected, sclera clear  NECK: supple, thyroid normal size, non-tender, without nodularity LYMPH:  no palpable lymphadenopathy in the cervical, axillary  LUNGS: clear to auscultation and percussion with normal breathing effort HEART: regular rate & rhythm and no murmurs and no lower extremity edema ABDOMEN:abdomen soft, non-tender and normal bowel sounds Musculoskeletal:no cyanosis of digits and no clubbing  NEURO: alert & oriented x 3 with fluent speech, no focal motor/sensory deficits BREAST: S/p left lumpectomy: surgical incision healed well. No palpable mass, nodules or adenopathy bilaterally. Breast exam benign.  LABORATORY DATA:  I have reviewed the data as listed CBC Latest Ref Rng & Units 01/13/2021 01/13/2020 09/04/2019  WBC 4.0 - 10.5 K/uL 4.8 5.3 5.0  Hemoglobin 12.0 - 15.0 g/dL 10.5(L) 14.4 12.3  Hematocrit 36.0 - 46.0 % 32.4(L) 41.4 35.7(L)  Platelets 150 - 400 K/uL 254 238 213     CMP Latest Ref Rng & Units 01/13/2021 01/13/2020 09/04/2019  Glucose 70 - 99 mg/dL 124(H) 98 88  BUN 6 - 20 mg/dL _0 Creatinine 0.44 - 1.00 mg/dL 0.89 0.84 0.80  Sodium 135 - 145 mmol/L 139 139 139  Potassium 3.5 - 5.1 mmol/L 4.8 4.0 4.2  Chloride 98 - 111 mmol/L 107 106 104  CO2 22 - 32 mmol/L _1 Calcium 8.9 - 10.3 mg/dL 9.0 9.5 8.7(L)  Total Protein 6.5 - 8.1 g/dL 6.3(L) 6.5 6.0(L)  Total Bilirubin 0.3 - 1.2 mg/dL 0.4 0.4 0.3  Alkaline Phos 38 - 126 U/L 53 66 51  AST 15 - 41 U/L _2 ALT 0 - 44 U/L 21 35 35      RADIOGRAPHIC STUDIES: I have personally reviewed the radiological images as listed and agreed with the findings in the report. No results found.   ASSESSMENT & PLAN:  NAJAI WASZAK is a 60 y.o. 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 Grade I -She was diagnosed in 07/2018. She is s/p left breast lumpectomyand adjuvantradiation. -Givenher0.7cm  tumor and low grade disease, I did notobtain Oncotype on her surgical sampledue to the high possibility of low risk of disease -I started her on anti-estrogen therapy with Tamoxifen in1/2020, but stopped due to arthritis in 09/2019. She did not want to restart. She is currently on surveillance alone.  -From a breast cancer standpoint, She is clinically doing well. Lab reviewed, her CBC and CMP are within normal limits except Hg 10.5, BG 124, protein 6.3. Her physical exam and her 07/2020 mammogram were unremarkable. There is no clinical concern for recurrence. -Continue surveillance. Next mammogram in 07/2021.  -f/u in 3 months   2. Sinus issues, Black stool  -For the past 2 months of head pounding, postnasal dripping, due to concern for sinus infection she was treated with 2 rounds of antibiotics, steroids and nasal sprays. This has not resolved issues.  -During this time of treatment she had several episodes of black diarrhea, which she attributed to antibiotics. She notes her recent OB Stool test was negative. She notes she did do  blood donation 3 weeks ago and her iron level was normal at that time.  -Hg is 10.5 today (01/13/21). I recommend iron panel to check for low iron. If iron level low, she can start OTC oral iron with Vit C or Orange juice. She is currently on B12 2500 units daily.  -If her anemia recurs, I recommend f/u with GI for endoscopy given upper GI bleed and anemia. She is agreeable.  -She is on Protonix but no current acid reflux symptoms. I advised her to use Tylenol instead of NSAIDs like Mobic for now.    3. Bone Health  -Her baseline DEXA from 04/2019 is normal (T-score -0.2).Will repeat in 2022.  -She is currently on Calcium with Vit D.   4. Cervical Spine degenerationandOsteoarthritis -Her cervical neck pain resolved after her cervical surgery in 09/2019. No longer on Gabapentin.  -She notes since stopping Tamoxifen her OA did improve. -She will continue to f/u with orthopedist and neurologist about this and treatment. -She notes recent return of radiating neck pain, so far manageable.    PLAN: -Lab today for her anemia workup, and I will call her with results  -hold mobic  -Lab and F/u in 3 months    No problem-specific Assessment & Plan notes found for this encounter.   Orders Placed This Encounter  Procedures  . Ferritin    Standing Status:   Future    Number of Occurrences:   1    Standing Expiration Date:   01/13/2022  . Iron and TIBC    Standing Status:   Future    Number of Occurrences:   1    Standing Expiration Date:   01/13/2022  . Vitamin B12    Standing Status:   Future    Number of Occurrences:   1    Standing Expiration Date:   01/13/2022  . Folate RBC    Standing Status:   Future    Number of Occurrences:   1    Standing Expiration Date:   01/13/2022  . Methylmalonic acid, serum    Standing Status:   Future    Number of Occurrences:   1    Standing Expiration Date:   01/13/2022  . Retic Panel    Standing Status:   Future    Number of Occurrences:   1     Standing Expiration Date:   01/13/2022   All questions were answered. The patient knows to  call the clinic with any problems, questions or concerns. No barriers to learning was detected. The total time spent in the appointment was 30 minutes.     Truitt Merle, MD 01/13/2021   I, Joslyn Devon, am acting as scribe for Truitt Merle, MD.   I have reviewed the above documentation for accuracy and completeness, and I agree with the above.

## 2021-01-13 ENCOUNTER — Other Ambulatory Visit: Payer: Self-pay

## 2021-01-13 ENCOUNTER — Inpatient Hospital Stay: Payer: BC Managed Care – PPO

## 2021-01-13 ENCOUNTER — Ambulatory Visit: Payer: BC Managed Care – PPO | Admitting: Nurse Practitioner

## 2021-01-13 ENCOUNTER — Inpatient Hospital Stay (HOSPITAL_BASED_OUTPATIENT_CLINIC_OR_DEPARTMENT_OTHER): Payer: BC Managed Care – PPO | Admitting: Hematology

## 2021-01-13 ENCOUNTER — Inpatient Hospital Stay: Payer: BC Managed Care – PPO | Attending: Hematology

## 2021-01-13 ENCOUNTER — Encounter: Payer: Self-pay | Admitting: Hematology

## 2021-01-13 VITALS — BP 133/63 | HR 80 | Temp 98.5°F | Resp 18 | Wt 136.0 lb

## 2021-01-13 DIAGNOSIS — F419 Anxiety disorder, unspecified: Secondary | ICD-10-CM | POA: Diagnosis not present

## 2021-01-13 DIAGNOSIS — Z79899 Other long term (current) drug therapy: Secondary | ICD-10-CM | POA: Insufficient documentation

## 2021-01-13 DIAGNOSIS — J329 Chronic sinusitis, unspecified: Secondary | ICD-10-CM | POA: Insufficient documentation

## 2021-01-13 DIAGNOSIS — K921 Melena: Secondary | ICD-10-CM | POA: Diagnosis not present

## 2021-01-13 DIAGNOSIS — L603 Nail dystrophy: Secondary | ICD-10-CM | POA: Insufficient documentation

## 2021-01-13 DIAGNOSIS — Z170 Estrogen receptor positive status [ER+]: Secondary | ICD-10-CM

## 2021-01-13 DIAGNOSIS — Z923 Personal history of irradiation: Secondary | ICD-10-CM | POA: Insufficient documentation

## 2021-01-13 DIAGNOSIS — Z862 Personal history of diseases of the blood and blood-forming organs and certain disorders involving the immune mechanism: Secondary | ICD-10-CM | POA: Insufficient documentation

## 2021-01-13 DIAGNOSIS — R195 Other fecal abnormalities: Secondary | ICD-10-CM | POA: Insufficient documentation

## 2021-01-13 DIAGNOSIS — K219 Gastro-esophageal reflux disease without esophagitis: Secondary | ICD-10-CM | POA: Diagnosis not present

## 2021-01-13 DIAGNOSIS — M1990 Unspecified osteoarthritis, unspecified site: Secondary | ICD-10-CM | POA: Diagnosis not present

## 2021-01-13 DIAGNOSIS — C50212 Malignant neoplasm of upper-inner quadrant of left female breast: Secondary | ICD-10-CM | POA: Insufficient documentation

## 2021-01-13 DIAGNOSIS — D649 Anemia, unspecified: Secondary | ICD-10-CM | POA: Diagnosis not present

## 2021-01-13 DIAGNOSIS — E039 Hypothyroidism, unspecified: Secondary | ICD-10-CM | POA: Diagnosis not present

## 2021-01-13 DIAGNOSIS — Z791 Long term (current) use of non-steroidal anti-inflammatories (NSAID): Secondary | ICD-10-CM | POA: Insufficient documentation

## 2021-01-13 LAB — CMP (CANCER CENTER ONLY)
ALT: 21 U/L (ref 0–44)
AST: 22 U/L (ref 15–41)
Albumin: 3.6 g/dL (ref 3.5–5.0)
Alkaline Phosphatase: 53 U/L (ref 38–126)
Anion gap: 8 (ref 5–15)
BUN: 10 mg/dL (ref 6–20)
CO2: 24 mmol/L (ref 22–32)
Calcium: 9 mg/dL (ref 8.9–10.3)
Chloride: 107 mmol/L (ref 98–111)
Creatinine: 0.89 mg/dL (ref 0.44–1.00)
GFR, Estimated: >60 mL/min (ref >60)
Glucose, Bld: 124 mg/dL — ABNORMAL HIGH (ref 70–99)
Potassium: 4.8 mmol/L (ref 3.5–5.1)
Sodium: 139 mmol/L (ref 135–145)
Total Bilirubin: 0.4 mg/dL (ref 0.3–1.2)
Total Protein: 6.3 g/dL — ABNORMAL LOW (ref 6.5–8.1)

## 2021-01-13 LAB — CBC WITH DIFFERENTIAL (CANCER CENTER ONLY)
Abs Immature Granulocytes: 0.02 10*3/uL (ref 0.00–0.07)
Basophils Absolute: 0 10*3/uL (ref 0.0–0.1)
Basophils Relative: 0 %
Eosinophils Absolute: 0.1 10*3/uL (ref 0.0–0.5)
Eosinophils Relative: 3 %
HCT: 32.4 % — ABNORMAL LOW (ref 36.0–46.0)
Hemoglobin: 10.5 g/dL — ABNORMAL LOW (ref 12.0–15.0)
Immature Granulocytes: 0 %
Lymphocytes Relative: 27 %
Lymphs Abs: 1.3 10*3/uL (ref 0.7–4.0)
MCH: 29.1 pg (ref 26.0–34.0)
MCHC: 32.4 g/dL (ref 30.0–36.0)
MCV: 89.8 fL (ref 80.0–100.0)
Monocytes Absolute: 0.4 10*3/uL (ref 0.1–1.0)
Monocytes Relative: 8 %
Neutro Abs: 3 10*3/uL (ref 1.7–7.7)
Neutrophils Relative %: 62 %
Platelet Count: 254 10*3/uL (ref 150–400)
RBC: 3.61 MIL/uL — ABNORMAL LOW (ref 3.87–5.11)
RDW: 12.6 % (ref 11.5–15.5)
WBC Count: 4.8 10*3/uL (ref 4.0–10.5)
nRBC: 0 /100 WBC

## 2021-01-13 LAB — VITAMIN B12: Vitamin B-12: 1110 pg/mL — ABNORMAL HIGH (ref 180–914)

## 2021-01-13 LAB — RETIC PANEL
Immature Retic Fract: 14.4 % (ref 2.3–15.9)
RBC.: 3.59 MIL/uL — ABNORMAL LOW (ref 3.87–5.11)
Retic Count, Absolute: 35.5 10*3/uL (ref 19.0–186.0)
Retic Ct Pct: 1 % (ref 0.4–3.1)
Reticulocyte Hemoglobin: 27.9 pg — ABNORMAL LOW (ref >27.9)

## 2021-01-14 ENCOUNTER — Telehealth: Payer: Self-pay | Admitting: Hematology

## 2021-01-14 LAB — IRON AND TIBC
Iron: 72 ug/dL (ref 41–142)
Saturation Ratios: 19 % — ABNORMAL LOW (ref 21–57)
TIBC: 379 ug/dL (ref 236–444)
UIBC: 307 ug/dL (ref 120–384)

## 2021-01-14 LAB — FOLATE RBC
Folate, Hemolysate: 342 ng/mL
Folate, RBC: 1003 ng/mL (ref >498)
Hematocrit: 34.1 % (ref 34.0–46.6)

## 2021-01-14 LAB — FERRITIN: Ferritin: 11 ng/mL (ref 11–307)

## 2021-01-14 NOTE — Telephone Encounter (Signed)
Scheduled appt per 3/09 LOS - left message for patient with appt date and time

## 2021-01-18 ENCOUNTER — Telehealth: Payer: Self-pay | Admitting: *Deleted

## 2021-01-18 LAB — METHYLMALONIC ACID, SERUM: Methylmalonic Acid, Quantitative: 121 nmol/L (ref 0–378)

## 2021-01-18 NOTE — Telephone Encounter (Signed)
Notified of lab results. She will take oral iron as instructed for 3 months. Already has a follow up appt in June

## 2021-04-12 NOTE — Progress Notes (Signed)
Dawn Rogers   [phone] [phone]   Clinic Follow up Note   Patient Care Team: Barrie Lyme as PCP - General (Physician Assistant) Fanny Skates, MD as Consulting Physician (General Surgery) Truitt Merle, MD as Consulting Physician (Hematology) Gery Pray, MD as Consulting Physician (Radiation Oncology) Alla Feeling, NP as Nurse Practitioner (Nurse Practitioner)  Date of Service:  04/16/2021  CHIEF COMPLAINT:  F/u of left breast cancer  SUMMARY OF ONCOLOGIC HISTORY: Oncology History Overview Note  Cancer Staging Malignant neoplasm of upper-inner quadrant of left breast in female, estrogen receptor positive ([place]) Staging form: Breast, AJCC 8th Edition - Clinical stage from 07/23/2018: Stage IA (cT1b, cN0, cM0, G1, ER+, PR+, HER2-) - Signed by Truitt Merle, MD on 08/01/2018 - Pathologic stage from 08/31/2018: Stage IA (pT1b, pN0, cM0, G1, ER+, PR+, HER2-) - Signed by Truitt Merle, MD on 11/26/2018     Malignant neoplasm of upper-inner quadrant of left breast in female, estrogen receptor positive ([place])  07/13/2018 Mammogram   07/13/2018 Screening Mammogram IMPRESSION: Further evaluation is suggested for possible distortion in the left breast    07/19/2018 Breast US    07/19/2018 Breast US IMPRESSION: 0.6 cm irregular mass with associated architectural distortion in the 9:30 position of the left breast 4 cm from the nipple. Findings are suspicious for malignancy.   Negative for left axillary lymphadenopathy    07/19/2018 Mammogram   07/19/2018 Diagnostic Mammogram IMPRESSION: 0.6 cm irregular mass with associated architectural distortion in the 9:30 position of the left breast 4 cm from the nipple. Findings are suspicious for malignancy.   Negative for left axillary lymphadenopathy.    07/23/2018 Cancer Staging   Staging form: Breast, AJCC 8th Edition - Clinical stage from 07/23/2018: Stage IA (cT1b, cN0, cM0, G1, ER+, PR+,  HER2-) - Signed by Truitt Merle, MD on 08/01/2018    07/23/2018 Receptors her2   ADDITIONAL INFORMATION: PROGNOSTIC INDICATORS Results: IMMUNOHISTOCHEMICAL AND MORPHOMETRIC ANALYSIS PERFORMED MANUALLY The tumor cells are Negative for Her2 (1+). Estrogen Receptor: 90%, POSITIVE, STRONG STAINING INTENSITY Progesterone Receptor: 90%, POSITIVE, STRONG STAINING INTENSITY Proliferation Marker Ki67: 10%    07/23/2018 Pathology Results   Diagnosis Breast, left, needle core biopsy, 9:30 o'clock, 4 cm fn - INVASIVE DUCTAL CARCINOMA, GRADE I. SEE NOTE.    07/27/2018 Initial Diagnosis   Malignant neoplasm of upper-inner quadrant of left breast in female, estrogen receptor positive ([place])    08/16/2018 Genetic Testing   The Multi-Cancer Panel offered by [facility] includes sequencing and/or deletion duplication testing of the following 91 genes: AIP, ALK, APC, ATM, AXIN2, BAP1, BARD1, BLM, BMPR1A, BRCA1, BRCA2, BRIP1, BUB1B, CASR, CDC73, CDH1, CDK4, CDKN1B, CDKN1C, CDKN2A, CEBPA, CEP57, CHEK2, CTNNA1, DICER1, DIS3L2, EGFR, ENG, EPCAM, FH, FLCN, GALNT12, GATA2, GPC3, GREM1, HOXB13, HRAS, KIT, MAX, MEN1, MET, MITF, MLH1, MLH3, MSH2, MSH3, MSH6, MUTYH, NBN, NF1, NF2, NTHL1, PALB2, PDGFRA, PHOX2B, PMS2, POLD1, POLE, POT1, PRKAR1A, PTCH1, PTEN, RAD50, RAD51C, RAD51D, RB1, RECQL4, RET, RNF43, RPS20, RUNX1, SDHA, SDHAF2, SDHB, SDHC, SDHD, SMAD4, SMARCA4, SMARCB1, SMARCE1, STK11, SUFU, TERC, TERT, TMEM127, TP53, TSC1, TSC2, VHL, WRN, WT1  Results: Negative, no pathogenic variants identified.  The date of this test report is 08/16/2018.     08/31/2018 Surgery   LEFT DOUBLE BRACKETED LUMPECTOMY WITH RADIOACTIVE SEED X'S 2, LEFT SENTINEL LYMPH NODE BIOPSY, INJECT BLUE DYE LEFT BREAST by Dr. Dalbert Batman 08/31/18    08/31/2018 Pathology Results   Diagnosis 08/31/18  1. Breast, lumpectomy, left - INVASIVE DUCTAL CARCINOMA, [place] GRADE 1 OF 3,  0.7 CM - CALCIFICATIONS ASSOCIATED WITH CARCINOMA - MARGINS UNINVOLVED BY  CARCINOMA (0.5 CM; POSTERIOR MARGIN) - COLUMNAR CELL AND FIBROCYSTIC CHANGES INCLUDING APOCRINE METAPLASIA - PSEUDOANGIOMATOUS STROMAL HYPERPLASIA - PREVIOUS BIOPSY SITE CHANGES PRESENT - SEE ONCOLOGY TABLE AND COMMENT BELOW 2. Lymph node, sentinel, biopsy, left axillary #1 - NO CARCINOMA IDENTIFIED IN ONE LYMPH NODE (0/1) 3. Lymph node, sentinel, biopsy, left axillary #2 - NO CARCINOMA IDENTIFIED IN ONE LYMPH NODE (0/1) 4. Lymph node, sentinel, biopsy, left axillary #3 - NO CARCINOMA IDENTIFIED IN ONE LYMPH NODE (0/1)    08/31/2018 Cancer Staging   Staging form: Breast, AJCC 8th Edition - Pathologic stage from 08/31/2018: Stage IA (pT1b, pN0, cM0, G1, ER+, PR+, HER2-) - Signed by Truitt Merle, MD on 11/26/2018    10/30/2018 - 11/28/2018 Radiation Therapy   Adjuvant Radiation with Dr. Earney Hamburg 10/30/18-11/28/18    11/2018 - 09/2019 Anti-estrogen oral therapy   Tamoxifen 77m daily started 11/2018. Stopped in 09/2019 due to arthritis. She opted to not restart.    06/06/2019 Survivorship   [name]        CURRENT THERAPY:  Surveillance   INTERVAL HISTORY:  Dawn BROOKis here for a follow up of left breast cancer. She was last seen by me 3 months ago. She presents to the clinic alone. She notes diffuse arthritis in her feet, shoulder, back and knees. She was also seen by Rheumatologist but only has osteoarthritis. She tried to stop Meloxicam but could not tolerate Tylenol alone. She notes she stopped given I suspected this was related to her anemia form GI blood loss. She notes she did donate blood 3 times in a year which also contributed to this. Oral iron supplement which has helped.     REVIEW OF SYSTEMS:   Constitutional: Denies fevers, chills or abnormal weight loss Eyes: Denies blurriness of vision Ears, nose, mouth, throat, and face: Denies mucositis or sore throat Respiratory: Denies cough, dyspnea or wheezes Cardiovascular: Denies palpitation, chest discomfort  or lower extremity swelling Gastrointestinal:  Denies nausea, heartburn or change in bowel habits Skin: Denies abnormal skin rashes Lymphatics: Denies new lymphadenopathy or easy bruising Neurological:Denies numbness, tingling or new weaknesses Behavioral/Psych: Mood is stable, no new changes  All other systems were reviewed with the patient and are negative.  MEDICAL HISTORY:  Past Medical History:  Diagnosis Date   Anxiety    [place] 08/[place]   left breast cancer   Family history of breast cancer    Family history of esophageal cancer    Family history of lymphoma    Family history of ovarian cancer    GERD (gastroesophageal reflux disease)    Hypothyroidism    Neck pain    Personal history of radiation therapy     SURGICAL HISTORY: Past Surgical History:  Procedure Laterality Date   ANKLE FRACTURE SURGERY Left    BREAST LUMPECTOMY Left 08/2018   BREAST LUMPECTOMY WITH RADIOACTIVE SEED AND SENTINEL LYMPH NODE BIOPSY Left 08/31/2018   Procedure: LEFT DOUBLE BRACKETED LUMPECTOMY WITH RADIOACTIVE SEED X'S 2, LEFT SENTINEL LYMPH NODE BIOPSY, INJECT BLUE DYE LEFT BREAST;  Surgeon: IFanny Skates MD;  [facility]  Service: General;  Laterality: Left;   CHOLECYSTECTOMY     TUBAL LIGATION      I have reviewed the social history and family history with the patient and they are unchanged from previous note.  ALLERGIES:  has No Known Allergies.  MEDICATIONS:  Current Outpatient Medications  Medication  Sig Dispense Refill   cyclobenzaprine (FLEXERIL) 5 MG tablet Take 5 mg by mouth 3 (three) times daily as needed for muscle spasms.     ergocalciferol (VITAMIN D2) 50000 units capsule Take 50,000 Units by mouth once a week.     Ibuprofen-Famotidine (DUEXIS) 800-26.6 MG TABS Take by mouth.     levothyroxine (SYNTHROID, LEVOTHROID) 25 MCG tablet Take 25 mcg by mouth daily before breakfast.     LORazepam (ATIVAN) 0.5 MG tablet Take 0.5 mg by mouth every  6 (six) hours as needed for anxiety.     meloxicam (MOBIC) 7.5 MG tablet Take 7.5 mg by mouth daily.     omeprazole (PRILOSEC) 20 MG capsule Take 20 mg by mouth daily.     No current facility-administered medications for this visit.    PHYSICAL EXAMINATION: ECOG PERFORMANCE STATUS: 0 - Asymptomatic  Vitals:   04/16/21 1444  BP: 118/72  Pulse: 83  Resp: 16  Temp: 98.1 F (36.7 C)  SpO2: 98%   Filed Weights   04/16/21 1444  Weight: 139 lb 8 oz (63.3 kg)    GENERAL:alert, no distress and comfortable SKIN: skin color, texture, turgor are normal, no rashes or significant lesions EYES: normal, Conjunctiva are pink and non-injected, sclera clear  NECK: supple, thyroid normal size, non-tender, without nodularity LYMPH:  no palpable lymphadenopathy in the cervical, axillary  LUNGS: clear to auscultation and percussion with normal breathing effort HEART: regular rate & rhythm and no murmurs and no lower extremity edema ABDOMEN:abdomen soft, non-tender and normal bowel sounds Musculoskeletal:no cyanosis of digits and no clubbing  NEURO: alert & oriented x 3 with fluent speech, no focal motor/sensory deficits BREAST: S/p Left lumpectomy: surgical incision healed well. No palpable mass, nodules or adenopathy bilaterally. Breast exam benign.   LABORATORY DATA:  I have reviewed the data as listed CBC Latest Ref Rng & Units 04/16/2021 01/13/2021 01/13/2021  WBC 4.0 - 10.5 K/uL 6.2 4.8 -  Hemoglobin 12.0 - 15.0 g/dL 12.5 10.5(L) -  Hematocrit 36.0 - 46.0 % 37.4 34.1 32.4(L)  Platelets 150 - 400 K/uL 234 254 -     CMP Latest Ref Rng & Units 04/16/2021 01/13/2021 01/13/2020  Glucose 70 - 99 mg/dL 79 124(H) 98  BUN 6 - 20 mg/dL 16 10 13   Creatinine 0.44 - 1.00 mg/dL 0.86 0.89 0.84  Sodium 135 - 145 mmol/L 136 139 139  Potassium 3.5 - 5.1 mmol/L 3.9 4.8 4.0  Chloride 98 - 111 mmol/L 102 107 106  CO2 22 - 32 mmol/L 26 24 24   Calcium 8.9 - 10.3 mg/dL 9.1 9.0 9.5  Total Protein 6.5 - 8.1 g/dL  6.2(L) 6.3(L) 6.5  Total Bilirubin 0.3 - 1.2 mg/dL 0.3 0.4 0.4  Alkaline Phos 38 - 126 U/L 61 53 66  AST 15 - 41 U/L 20 22 26   ALT 0 - 44 U/L 18 21 35      RADIOGRAPHIC STUDIES: I have personally reviewed the radiological images as listed and agreed with the findings in the report. No results found.   ASSESSMENT & PLAN:  Dawn Rogers is a 60 y.o. female with    1. Malignant Neoplasm of Upper-inner Quadrant of Left Breast, 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 Grade I -She was diagnosed in 07/2018. She is s/p left breast lumpectomy and adjuvant radiation.  -Given her 0.7 cm tumor and low grade disease, I did not obtain Oncotype on her surgical sample due to the high  possibility of low risk of disease -I started her on anti-estrogen therapy with Tamoxifen in 11/2018, but stopped due to arthritis in 09/2019. She did not want to restart. She is currently on surveillance alone.  -She is clinically doing well. Lab reviewed, her CBC and CMP are within normal limits. Her physical exam was unremarkable. There is no clinical concern for recurrence. -Continue Surveillance. Next mammogram in 9/202.2  -f/u in 6 months with NP Lacie    2. IDA  -For about 2 months in early 2022, she had several episodes of black diarrhea, which she attributed to antibiotics. She notes her 2022 OB Stool test was negative. She notes she did do blood donation 3 times in a year span. Her 01/2021 Labs showed mild anemia. She has been donating blood also -her lab showed mild iron deficiency last time with mild anemia  -She took iron supplement for the past 6 months. Labs today show anemia resolved. She can continue oral iron and can continue donating blood. I discussed if this recurs may proceed with colonoscopy sooner.  -I also suggest reducing amount of NSAIDs which can lead to GI bleeding.    3. Bone Health  -Her baseline DEXA from 04/2019 is normal (T-score -0.2). Will repeat in 2022.  -She is  currently on Calcium with Vit D.     4. Cervical Spine degeneration and Osteoarthritis -She continues to have diffuse OA in her back, shoulder, knees, feet.  -She iwas on Meloxicam for pain, switched to tylenol but not very effective -I encourage her to exercise and f/u with PCP     PLAN:  -Mammogram in 07/2021  -Lab and F/u in 6 months with NP Lacie    No problem-specific Assessment & Plan notes found for this encounter.   Orders Placed This Encounter  Procedures   MM DIAG BREAST TOMO BILATERAL    Standing Status:   Future    Standing Expiration Date:   04/16/2022    Order Specific Question:   Reason for Exam (SYMPTOM  OR DIAGNOSIS REQUIRED)    Answer:   screening    Order Specific Question:   Preferred imaging location?    Answer:   [facility]    Order Specific Question:   Is the patient pregnant?    Answer:   No   Iron and TIBC    Standing Status:   Standing    Number of Occurrences:   2    Standing Expiration Date:   04/16/2022   Ferritin    Standing Status:   Standing    Number of Occurrences:   2    Standing Expiration Date:   04/16/2022   All questions were answered. The patient knows to call the clinic with any problems, questions or concerns. No barriers to learning was detected. The total time spent in the appointment was 25 minutes.     Truitt Merle, MD 04/16/2021   I, Joslyn Devon, am acting as scribe for Truitt Merle, MD.   I have reviewed the above documentation for accuracy and completeness, and I agree with the above.

## 2021-04-16 ENCOUNTER — Inpatient Hospital Stay: Payer: BC Managed Care – PPO

## 2021-04-16 ENCOUNTER — Inpatient Hospital Stay: Payer: BC Managed Care – PPO | Attending: Hematology | Admitting: Hematology

## 2021-04-16 ENCOUNTER — Other Ambulatory Visit: Payer: Self-pay

## 2021-04-16 VITALS — BP 118/72 | HR 83 | Temp 98.1°F | Resp 16 | Ht 64.0 in | Wt 139.5 lb

## 2021-04-16 DIAGNOSIS — Z79899 Other long term (current) drug therapy: Secondary | ICD-10-CM | POA: Insufficient documentation

## 2021-04-16 DIAGNOSIS — D500 Iron deficiency anemia secondary to blood loss (chronic): Secondary | ICD-10-CM | POA: Diagnosis not present

## 2021-04-16 DIAGNOSIS — C50212 Malignant neoplasm of upper-inner quadrant of left female breast: Secondary | ICD-10-CM | POA: Diagnosis present

## 2021-04-16 DIAGNOSIS — D509 Iron deficiency anemia, unspecified: Secondary | ICD-10-CM | POA: Insufficient documentation

## 2021-04-16 DIAGNOSIS — E039 Hypothyroidism, unspecified: Secondary | ICD-10-CM | POA: Diagnosis not present

## 2021-04-16 DIAGNOSIS — Z170 Estrogen receptor positive status [ER+]: Secondary | ICD-10-CM

## 2021-04-16 LAB — CMP (CANCER CENTER ONLY)
ALT: 18 U/L (ref 0–44)
AST: 20 U/L (ref 15–41)
Albumin: 3.6 g/dL (ref 3.5–5.0)
Alkaline Phosphatase: 61 U/L (ref 38–126)
Anion gap: 8 (ref 5–15)
BUN: 16 mg/dL (ref 6–20)
CO2: 26 mmol/L (ref 22–32)
Calcium: 9.1 mg/dL (ref 8.9–10.3)
Chloride: 102 mmol/L (ref 98–111)
Creatinine: 0.86 mg/dL (ref 0.44–1.00)
GFR, Estimated: >60 mL/min (ref >60)
Glucose, Bld: 79 mg/dL (ref 70–99)
Potassium: 3.9 mmol/L (ref 3.5–5.1)
Sodium: 136 mmol/L (ref 135–145)
Total Bilirubin: 0.3 mg/dL (ref 0.3–1.2)
Total Protein: 6.2 g/dL — ABNORMAL LOW (ref 6.5–8.1)

## 2021-04-16 LAB — CBC WITH DIFFERENTIAL (CANCER CENTER ONLY)
Abs Immature Granulocytes: 0.01 10*3/uL (ref 0.00–0.07)
Basophils Absolute: 0 10*3/uL (ref 0.0–0.1)
Basophils Relative: 1 %
Eosinophils Absolute: 0.2 10*3/uL (ref 0.0–0.5)
Eosinophils Relative: 3 %
HCT: 37.4 % (ref 36.0–46.0)
Hemoglobin: 12.5 g/dL (ref 12.0–15.0)
Immature Granulocytes: 0 %
Lymphocytes Relative: 29 %
Lymphs Abs: 1.8 10*3/uL (ref 0.7–4.0)
MCH: 30.9 pg (ref 26.0–34.0)
MCHC: 33.4 g/dL (ref 30.0–36.0)
MCV: 92.6 fL (ref 80.0–100.0)
Monocytes Absolute: 0.5 10*3/uL (ref 0.1–1.0)
Monocytes Relative: 8 %
Neutro Abs: 3.6 10*3/uL (ref 1.7–7.7)
Neutrophils Relative %: 59 %
Platelet Count: 234 10*3/uL (ref 150–400)
RBC: 4.04 MIL/uL (ref 3.87–5.11)
RDW: 14.1 % (ref 11.5–15.5)
WBC Count: 6.2 10*3/uL (ref 4.0–10.5)
nRBC: 0 % (ref 0.0–0.2)

## 2021-04-19 ENCOUNTER — Telehealth: Payer: Self-pay | Admitting: Hematology

## 2021-04-19 NOTE — Telephone Encounter (Signed)
Scheduled follow-up appointment per 6/10 los. Patient is aware.

## 2021-05-04 ENCOUNTER — Other Ambulatory Visit: Payer: Self-pay | Admitting: Otolaryngology

## 2021-05-04 DIAGNOSIS — J329 Chronic sinusitis, unspecified: Secondary | ICD-10-CM

## 2021-06-21 ENCOUNTER — Other Ambulatory Visit: Payer: BC Managed Care – PPO

## 2021-07-22 ENCOUNTER — Ambulatory Visit
Admission: RE | Admit: 2021-07-22 | Discharge: 2021-07-22 | Disposition: A | Discharge status: Home / Self Care | Payer: BC Managed Care – PPO | Source: Ambulatory Visit | Attending: Hematology | Admitting: Hematology

## 2021-07-22 ENCOUNTER — Other Ambulatory Visit: Payer: Self-pay

## 2021-07-22 DIAGNOSIS — Z170 Estrogen receptor positive status [ER+]: Secondary | ICD-10-CM

## 2021-07-22 DIAGNOSIS — C50212 Malignant neoplasm of upper-inner quadrant of left female breast: Secondary | ICD-10-CM

## 2021-08-18 DIAGNOSIS — Z853 Personal history of malignant neoplasm of breast: Secondary | ICD-10-CM | POA: Insufficient documentation

## 2021-08-20 DIAGNOSIS — Z85828 Personal history of other malignant neoplasm of skin: Secondary | ICD-10-CM | POA: Insufficient documentation

## 2021-09-16 ENCOUNTER — Ambulatory Visit (INDEPENDENT_AMBULATORY_CARE_PROVIDER_SITE_OTHER): Payer: BC Managed Care – PPO

## 2021-09-16 ENCOUNTER — Encounter: Payer: Self-pay | Admitting: Family Medicine

## 2021-09-16 ENCOUNTER — Ambulatory Visit: Payer: BC Managed Care – PPO | Admitting: Family Medicine

## 2021-09-16 ENCOUNTER — Other Ambulatory Visit: Payer: Self-pay

## 2021-09-16 VITALS — BP 121/82 | HR 79 | Temp 98.3°F | Ht 64.0 in | Wt 138.6 lb

## 2021-09-16 DIAGNOSIS — F411 Generalized anxiety disorder: Secondary | ICD-10-CM

## 2021-09-16 DIAGNOSIS — Z923 Personal history of irradiation: Secondary | ICD-10-CM

## 2021-09-16 DIAGNOSIS — M5412 Radiculopathy, cervical region: Secondary | ICD-10-CM

## 2021-09-16 DIAGNOSIS — K219 Gastro-esophageal reflux disease without esophagitis: Secondary | ICD-10-CM

## 2021-09-16 DIAGNOSIS — R0609 Other forms of dyspnea: Secondary | ICD-10-CM

## 2021-09-16 DIAGNOSIS — E782 Mixed hyperlipidemia: Secondary | ICD-10-CM | POA: Diagnosis not present

## 2021-09-16 DIAGNOSIS — M2550 Pain in unspecified joint: Secondary | ICD-10-CM

## 2021-09-16 DIAGNOSIS — E559 Vitamin D deficiency, unspecified: Secondary | ICD-10-CM

## 2021-09-16 DIAGNOSIS — E039 Hypothyroidism, unspecified: Secondary | ICD-10-CM

## 2021-09-16 DIAGNOSIS — Z853 Personal history of malignant neoplasm of breast: Secondary | ICD-10-CM

## 2021-09-16 DIAGNOSIS — G959 Disease of spinal cord, unspecified: Secondary | ICD-10-CM

## 2021-09-16 DIAGNOSIS — D500 Iron deficiency anemia secondary to blood loss (chronic): Secondary | ICD-10-CM

## 2021-09-16 MED ORDER — DULOXETINE HCL 30 MG PO CPEP: 30.0000 mg | ORAL_CAPSULE | Freq: Every day | ORAL | 0 refills | Status: DC

## 2021-09-16 MED ORDER — ESOMEPRAZOLE MAGNESIUM 40 MG PO CPDR: 40.0000 mg | DELAYED_RELEASE_CAPSULE | Freq: Every day | ORAL | 3 refills | Status: DC

## 2021-09-16 MED ORDER — PREDNISONE 20 MG PO TABS: 40.0000 mg | ORAL_TABLET | Freq: Every day | ORAL | 0 refills | Status: AC

## 2021-09-16 NOTE — Progress Notes (Signed)
Subjective:  Patient ID: Dawn Rogers, female    DOB: 08/03/1961, 60 y.o.   MRN: [id number]  Patient Care Team: Barrie Lyme as PCP - General (Physician Assistant) Fanny Skates, MD as Consulting Physician (General Surgery) Truitt Merle, MD as Consulting Physician (Hematology) Gery Pray, MD as Consulting Physician (Radiation Oncology) Alla Feeling, NP as Nurse Practitioner (Nurse Practitioner)   Chief Complaint:  New Patient (Initial Visit) (Thyroid mgmt/Arthritic pain)   HPI: Dawn Rogers is a 60 y.o. female presenting on 09/16/2021 for New Patient (Initial Visit) (Thyroid mgmt/Arthritic pain)  Patient is a very pleasant 60 year old female who presents today to establish care with new primary care provider.  She was formally followed by Dr. Edrick Oh with [facility] in [place].  She is actively followed by oncology due to a history of right breast cancer status post lumpectomy and radiation therapy in 2019.  She suffers with multiple arthralgias and has been seen by rheumatology in the past.  She has a strong family history of RA but states rheumatologist told her she only had osteoarthritis.  She has tried several medications in the past for her ongoing pain without any significant relief of symptoms without side effects.  She takes Flexeril as needed for her cervical radiculopathy and cervicalgia, states she has not taken this in a while.  She states that some days the arthritis is pretty significant and she has been trying to manage with only Tylenol arthritis.  She was on meloxicam in the past but ended up being anemic due to a probable GI bleed, so meloxicam was stopped.  She is on omeprazole for acid reflux, symptoms very well controlled.  She has acquired hypothyroidism and is on 25 mcg of Synthroid daily, has been on this dose for quite some time with great control.  Over the last few weeks she has noticed increased hair loss, brittle nails, and  abnormal bowel movements.  No recent changes in medications. She has also noticed some very mild dyspnea with significant exertion, states this is happened 1-2 times over the last several months.  No associated chest pain, palpitations, orthopnea, PND, leg swelling, dizziness, diaphoresis, or syncope.  Reports symptoms only last for a few seconds and then resolve.  She states she has not had an echocardiogram status post radiation therapy.  She does have slight anxiety and was on medications in the past but felt her symptoms had resolved so she took herself off of the medications.  She still has some episodes of anxiety but nothing significant.Marland Kitchen Upon review of labs is noted that cholesterol has been elevated several times in the past, patient is not on any medications.  Depression screen [facility] 2/9 09/16/2021 09/17/2018  Decreased Interest 0 0  Down, Depressed, Hopeless 0 1  PHQ - 2 Score 0 1  Altered sleeping 0 -  Tired, decreased energy 2 -  Change in appetite 0 -  Feeling bad or failure about yourself  0 -  Trouble concentrating 1 -  Moving slowly or fidgety/restless 0 -  Suicidal thoughts 0 -  PHQ-9 Score 3 -  Difficult doing work/chores Not difficult at all -   GAD 7 : Generalized Anxiety Score 09/16/2021  Nervous, Anxious, on Edge 1  Control/stop worrying 1  Worry too much - different things 1  Trouble relaxing 1  Restless 0  Easily annoyed or irritable 0  Afraid - awful might happen 1  Total GAD 7 Score 5  Anxiety Difficulty Not difficult at all      Relevant past medical, surgical, family, and social history reviewed and updated as indicated.  Allergies and medications reviewed and updated. Data reviewed: Chart in Epic.   Past Medical History:  Diagnosis Date   Anxiety    Arthritis    Cancer ([place]) 08/02/2018   left breast cancer   Family history of breast cancer    Family history of esophageal cancer    Family history of lymphoma    Family history of ovarian cancer     GERD (gastroesophageal reflux disease)    Hypothyroidism    Neck pain    Personal history of radiation therapy     Past Surgical History:  Procedure Laterality Date   ANKLE FRACTURE SURGERY Left    BREAST LUMPECTOMY Left 08/2018   BREAST LUMPECTOMY WITH RADIOACTIVE SEED AND SENTINEL LYMPH NODE BIOPSY Left 08/31/2018   Procedure: LEFT DOUBLE BRACKETED LUMPECTOMY WITH RADIOACTIVE SEED X'S 2, LEFT SENTINEL LYMPH NODE BIOPSY, INJECT BLUE DYE LEFT BREAST;  Surgeon: Fanny Skates, MD;  Location: [facility];  Service: General;  Laterality: Left;   CHOLECYSTECTOMY     TUBAL LIGATION      Social History   Socioeconomic History   Marital status: Married    Spouse name: Not on file   Number of children: Not on file   Years of education: Not on file   Highest education level: Not on file  Occupational History   Not on file  Tobacco Use   Smoking status: Never   Smokeless tobacco: Never  Substance and Sexual Activity   Alcohol use: Never   Drug use: Never   Sexual activity: Not on file  Other Topics Concern   Not on file  Social History Narrative   Not on file   Social Determinants of Health   Financial Resource Strain: Not on file  Food Insecurity: Not on file  Transportation Needs: Not on file  Physical Activity: Not on file  Stress: Not on file  Social Connections: Not on file  Intimate Partner Violence: Not on file    Outpatient Encounter Medications as of 09/16/2021  Medication Sig   cyclobenzaprine (FLEXERIL) 5 MG tablet Take 5 mg by mouth 3 (three) times daily as needed for muscle spasms.   DULoxetine (CYMBALTA) 30 MG capsule Take 1 capsule (30 mg total) by mouth daily.   ergocalciferol (VITAMIN D2) 50000 units capsule Take 50,000 Units by mouth once a week.   esomeprazole (NEXIUM) 40 MG capsule Take 1 capsule (40 mg total) by mouth daily.   levothyroxine (SYNTHROID, LEVOTHROID) 25 MCG tablet Take 25 mcg by mouth daily before breakfast.    predniSONE (DELTASONE) 20 MG tablet Take 2 tablets (40 mg total) by mouth daily with breakfast for 5 days.   [DISCONTINUED] meloxicam (MOBIC) 7.5 MG tablet Take 7.5 mg by mouth daily.   [DISCONTINUED] omeprazole (PRILOSEC) 20 MG capsule Take 20 mg by mouth daily.   [DISCONTINUED] Ibuprofen-Famotidine (DUEXIS) 800-26.6 MG TABS Take by mouth.   [DISCONTINUED] LORazepam (ATIVAN) 0.5 MG tablet Take 0.5 mg by mouth every 6 (six) hours as needed for anxiety.   No facility-administered encounter medications on file as of 09/16/2021.    No Known Allergies  Review of Systems  Constitutional:  Positive for fatigue. Negative for activity change, appetite change, chills, diaphoresis, fever and unexpected weight change.  HENT: Negative.  Negative for congestion.   Eyes: Negative.  Negative for photophobia and visual disturbance.  Respiratory:  Positive for shortness of breath. Negative for apnea, cough, choking, chest tightness, wheezing and stridor.   Cardiovascular:  Negative for chest pain, palpitations and leg swelling.  Gastrointestinal:  Negative for abdominal pain, blood in stool, constipation, diarrhea, nausea and vomiting.  Endocrine: Negative.  Negative for cold intolerance, heat intolerance, polydipsia, polyphagia and polyuria.  Genitourinary:  Negative for difficulty urinating, dysuria, frequency, urgency, vaginal bleeding, vaginal discharge and vaginal pain.  Musculoskeletal:  Positive for arthralgias, back pain, neck pain and neck stiffness. Negative for gait problem, joint swelling and myalgias.  Skin: Negative.   Allergic/Immunologic: Negative.   Neurological:  Negative for dizziness, tremors, seizures, syncope, facial asymmetry, speech difficulty, weakness, light-headedness, numbness and headaches.  Hematological: Negative.   Psychiatric/Behavioral:  Positive for decreased concentration and sleep disturbance. Negative for behavioral problems, confusion, dysphoric mood, hallucinations,  self-injury and suicidal ideas. The patient is nervous/anxious. The patient is not hyperactive.   All other systems reviewed and are negative.      Objective:  BP 121/82   Pulse 79   Temp 98.3 F (36.8 C)   Ht _0  (1.626 m)   Wt 138 lb 9.6 oz (62.9 kg)   SpO2 96%   BMI 23.79 kg/m    Wt Readings from Last 3 Encounters:  09/16/21 138 lb 9.6 oz (62.9 kg)  04/16/21 139 lb 8 oz (63.3 kg)  01/13/21 136 lb (61.7 kg)    Physical Exam Vitals and nursing note reviewed.  Constitutional:      General: She is not in acute distress.    Appearance: Normal appearance. She is well-developed, well-groomed and normal weight. She is not ill-appearing, toxic-appearing or diaphoretic.  HENT:     Head: Normocephalic and atraumatic.     Jaw: There is normal jaw occlusion.     Right Ear: Hearing normal.     Left Ear: Hearing normal.     Nose: Nose normal.     Mouth/Throat:     Lips: Pink.     Mouth: Mucous membranes are moist.     Pharynx: Oropharynx is clear. Uvula midline.  Eyes:     General: Lids are normal.     Extraocular Movements: Extraocular movements intact.     Conjunctiva/sclera: Conjunctivae normal.     Pupils: Pupils are equal, round, and reactive to light.  Neck:     Thyroid: No thyroid mass, thyromegaly or thyroid tenderness.     Vascular: No carotid bruit or JVD.     Trachea: Trachea and phonation normal.  Cardiovascular:     Rate and Rhythm: Normal rate and regular rhythm.     Chest Wall: PMI is not displaced.     Pulses: Normal pulses.     Heart sounds: Normal heart sounds. No murmur heard.   No friction rub. No gallop.  Pulmonary:     Effort: Pulmonary effort is normal. No respiratory distress.     Breath sounds: Normal breath sounds. No wheezing.  Abdominal:     General: Bowel sounds are normal. There is no distension or abdominal bruit.     Palpations: Abdomen is soft. There is no hepatomegaly or splenomegaly.     Tenderness: There is no abdominal tenderness.  There is no right CVA tenderness or left CVA tenderness.     Hernia: No hernia is present.  Musculoskeletal:        General: Normal range of motion.     Cervical back: Normal range of motion and neck supple.  Right lower leg: No edema.     Left lower leg: No edema.  Lymphadenopathy:     Cervical: No cervical adenopathy.  Skin:    General: Skin is warm and dry.     Capillary Refill: Capillary refill takes less than 2 seconds.     Coloration: Skin is not cyanotic, jaundiced or pale.     Findings: No rash.  Neurological:     General: No focal deficit present.     Mental Status: She is alert and oriented to person, place, and time.     Cranial Nerves: No cranial nerve deficit.     Sensory: Sensation is intact. No sensory deficit.     Motor: Motor function is intact. No weakness.     Coordination: Coordination is intact. Coordination normal.     Gait: Gait is intact. Gait normal.     Deep Tendon Reflexes: Reflexes are normal and symmetric. Reflexes normal.  Psychiatric:        Attention and Perception: Attention and perception normal.        Mood and Affect: Mood and affect normal.        Speech: Speech normal.        Behavior: Behavior normal. Behavior is cooperative.        Thought Content: Thought content normal.        Cognition and Memory: Cognition and memory normal.        Judgment: Judgment normal.    Results for orders placed or performed in visit on 04/16/21  CMP ([facility] only)  Result Value Ref Range   Sodium 136 135 - 145 mmol/L   Potassium 3.9 3.5 - 5.1 mmol/L   Chloride 102 98 - 111 mmol/L   CO2 26 22 - 32 mmol/L   Glucose, Bld 79 70 - 99 mg/dL   BUN 16 6 - 20 mg/dL   Creatinine 0.86 0.44 - 1.00 mg/dL   Calcium 9.1 8.9 - 10.3 mg/dL   Total Protein 6.2 (L) 6.5 - 8.1 g/dL   Albumin 3.6 3.5 - 5.0 g/dL   AST 20 15 - 41 U/L   ALT 18 0 - 44 U/L   Alkaline Phosphatase 61 38 - 126 U/L   Total Bilirubin 0.3 0.3 - 1.2 mg/dL   GFR, Estimated >60 >60 mL/min    Anion gap 8 5 - 15  CBC with Differential (Cancer Center Only)  Result Value Ref Range   WBC Count 6.2 4.0 - 10.5 K/uL   RBC 4.04 3.87 - 5.11 MIL/uL   Hemoglobin 12.5 12.0 - 15.0 g/dL   HCT 37.4 36.0 - 46.0 %   MCV 92.6 80.0 - 100.0 fL   MCH 30.9 26.0 - 34.0 pg   MCHC 33.4 30.0 - 36.0 g/dL   RDW 14.1 11.5 - 15.5 %   Platelet Count 234 150 - 400 K/uL   nRBC 0.0 0.0 - 0.2 %   Neutrophils Relative % 59 %   Neutro Abs 3.6 1.7 - 7.7 K/uL   Lymphocytes Relative 29 %   Lymphs Abs 1.8 0.7 - 4.0 K/uL   Monocytes Relative 8 %   Monocytes Absolute 0.5 0.1 - 1.0 K/uL   Eosinophils Relative 3 %   Eosinophils Absolute 0.2 0.0 - 0.5 K/uL   Basophils Relative 1 %   Basophils Absolute 0.0 0.0 - 0.1 K/uL   Immature Granulocytes 0 %   Abs Immature Granulocytes 0.01 0.00 - 0.07 K/uL       Pertinent labs &  imaging results that were available during my care of the patient were reviewed by me and considered in my medical decision making.  Assessment & Plan:  Dawn Rogers was seen today for new patient (initial visit).  Diagnoses and all orders for this visit:  Mixed hyperlipidemia Will check labs today, diet and exercise encouraged, patient to start red yeast rice over-the-counter. -     CMP14+EGFR -     Lipid panel  Cervical myelopathy with cervical radiculopathy (HCC) Ongoing symptoms, none new or worsening.  We will burst of steroids to see if beneficial. -     predniSONE (DELTASONE) 20 MG tablet; Take 2 tablets (40 mg total) by mouth daily with breakfast for 5 days.  Multiple joint pain Ongoing symptoms, not new or worsening, we will recheck ANA along with vitamin D, thyroid panel, and electrolytes.  We will burst of steroids to see if beneficial.  Symptomatic care discussed in detail.  Will add Cymbalta to daily regimen for chronic pain syndrome and anxiety. -     CMP14+EGFR -     Thyroid Panel With TSH -     VITAMIN D 25 Hydroxy (Vit-D Deficiency, Fractures) -     ANA,IFA RA Diag Pnl w/rflx  Tit/Patn -     DULoxetine (CYMBALTA) 30 MG capsule; Take 1 capsule (30 mg total) by mouth daily. -     predniSONE (DELTASONE) 20 MG tablet; Take 2 tablets (40 mg total) by mouth daily with breakfast for 5 days.  GAD (generalized anxiety disorder) We will recheck thyroid function today.  Will add Cymbalta daily as this will be beneficial for chronic pain syndrome as well. -     Thyroid Panel With TSH -     DULoxetine (CYMBALTA) 30 MG capsule; Take 1 capsule (30 mg total) by mouth daily.  Vitamin D deficiency We will recheck levels today and adjust repletion therapy if warranted. -     VITAMIN D 25 Hydroxy (Vit-D Deficiency, Fractures)  Acquired hypothyroidism We will recheck levels today and adjust repletion therapy if warranted. -     Thyroid Panel With TSH  Iron deficiency anemia due to chronic blood loss Has not been taking iron as this upset her stomach.  We will recheck anemia profile today. -     Anemia Profile B  Gastroesophageal reflux disease without esophagitis Well-controlled on current PPI therapy but concerned about omeprazole being linked to dementia, will switch to Nexium today. -     esomeprazole (NEXIUM) 40 MG capsule; Take 1 capsule (40 mg total) by mouth daily.  History of malignant neoplasm of breast Followed by oncology on a regular basis.  No longer on tamoxifen therapy due to arthritis.  We will recheck anemia profile and CMP today. -     Anemia Profile B -     CMP14+EGFR  DOE (dyspnea on exertion) History of radiation therapy Patient reports minimal dyspnea on exertion, very infrequent and only lasting for seconds.  Patient does have a history of radiation therapy to chest.  Will obtain chest x-ray and echocardiogram today. -     DG Chest 2 View; Future -     ECHOCARDIOGRAM COMPLETE; Future    Continue all other maintenance medications.  Follow up plan: Return in about 3 months (around 12/17/2021), or if symptoms worsen or fail to improve, for arthritis,  cymbalta.   Continue healthy lifestyle choices, including diet (rich in fruits, vegetables, and lean proteins, and low in salt and simple carbohydrates) and exercise (at least 30  minutes of moderate physical activity daily).   The above assessment and management plan was discussed with the patient. The patient verbalized understanding of and has agreed to the management plan. Patient is aware to call the clinic if they develop any new symptoms or if symptoms persist or worsen. Patient is aware when to return to the clinic for a follow-up visit. Patient educated on when it is appropriate to go to the emergency department.   Monia Pouch, FNP-C [facility] [phone]

## 2021-09-17 LAB — ANEMIA PROFILE B
Basophils Absolute: 0 10*3/uL (ref 0.0–0.2)
Basos: 1 %
EOS (ABSOLUTE): 0.2 10*3/uL (ref 0.0–0.4)
Eos: 2 %
Ferritin: 90 ng/mL (ref 15–150)
Folate: 15.6 ng/mL (ref >3.0)
Hematocrit: 43 % (ref 34.0–46.6)
Hemoglobin: 14.4 g/dL (ref 11.1–15.9)
Immature Grans (Abs): 0 10*3/uL (ref 0.0–0.1)
Immature Granulocytes: 0 %
Iron Saturation: 24 % (ref 15–55)
Iron: 68 ug/dL (ref 27–159)
Lymphocytes Absolute: 1.7 10*3/uL (ref 0.7–3.1)
Lymphs: 20 %
MCH: 33 pg (ref 26.6–33.0)
MCHC: 33.5 g/dL (ref 31.5–35.7)
MCV: 99 fL — ABNORMAL HIGH (ref 79–97)
Monocytes Absolute: 0.7 10*3/uL (ref 0.1–0.9)
Monocytes: 8 %
Neutrophils Absolute: 5.9 10*3/uL (ref 1.4–7.0)
Neutrophils: 69 %
Platelets: 262 10*3/uL (ref 150–450)
RBC: 4.36 x10E6/uL (ref 3.77–5.28)
RDW: 11.8 % (ref 11.7–15.4)
Retic Ct Pct: 1.7 % (ref 0.6–2.6)
Total Iron Binding Capacity: 289 ug/dL (ref 250–450)
UIBC: 221 ug/dL (ref 131–425)
Vitamin B-12: 462 pg/mL (ref 232–1245)
WBC: 8.5 10*3/uL (ref 3.4–10.8)

## 2021-09-17 LAB — CMP14+EGFR
ALT: 21 IU/L (ref 0–32)
AST: 21 IU/L (ref 0–40)
Albumin/Globulin Ratio: 2 (ref 1.2–2.2)
Albumin: 4.2 g/dL (ref 3.8–4.9)
Alkaline Phosphatase: 71 IU/L (ref 44–121)
BUN/Creatinine Ratio: 13 (ref 9–23)
BUN: 9 mg/dL (ref 6–24)
Bilirubin Total: 0.4 mg/dL (ref 0.0–1.2)
CO2: 25 mmol/L (ref 20–29)
Calcium: 9.6 mg/dL (ref 8.7–10.2)
Chloride: 101 mmol/L (ref 96–106)
Creatinine, Ser: 0.7 mg/dL (ref 0.57–1.00)
Globulin, Total: 2.1 g/dL (ref 1.5–4.5)
Glucose: 95 mg/dL (ref 70–99)
Potassium: 4.8 mmol/L (ref 3.5–5.2)
Sodium: 140 mmol/L (ref 134–144)
Total Protein: 6.3 g/dL (ref 6.0–8.5)
eGFR: 100 mL/min/{1.73_m2} (ref >59)

## 2021-09-17 LAB — THYROID PANEL WITH TSH
Free Thyroxine Index: 2.4 (ref 1.2–4.9)
T3 Uptake Ratio: 25 % (ref 24–39)
T4, Total: 9.6 ug/dL (ref 4.5–12.0)
TSH: 2.29 u[IU]/mL (ref 0.450–4.500)

## 2021-09-17 LAB — LIPID PANEL
Chol/HDL Ratio: 3 ratio (ref 0.0–4.4)
Cholesterol, Total: 213 mg/dL — ABNORMAL HIGH (ref 100–199)
HDL: 72 mg/dL (ref >39)
LDL Chol Calc (NIH): 118 mg/dL — ABNORMAL HIGH (ref 0–99)
Triglycerides: 130 mg/dL (ref 0–149)
VLDL Cholesterol Cal: 23 mg/dL (ref 5–40)

## 2021-09-17 LAB — ANA,IFA RA DIAG PNL W/RFLX TIT/PATN
ANA Titer 1: NEGATIVE
Cyclic Citrullin Peptide Ab: 3 units (ref 0–19)
Rheumatoid fact SerPl-aCnc: <10 IU/mL (ref <14.0)

## 2021-09-17 LAB — VITAMIN D 25 HYDROXY (VIT D DEFICIENCY, FRACTURES): Vit D, 25-Hydroxy: 70.6 ng/mL (ref 30.0–100.0)

## 2021-10-08 ENCOUNTER — Other Ambulatory Visit: Payer: Self-pay | Admitting: Family Medicine

## 2021-10-08 DIAGNOSIS — M2550 Pain in unspecified joint: Secondary | ICD-10-CM

## 2021-10-08 DIAGNOSIS — F411 Generalized anxiety disorder: Secondary | ICD-10-CM

## 2021-10-13 ENCOUNTER — Ambulatory Visit (INDEPENDENT_AMBULATORY_CARE_PROVIDER_SITE_OTHER): Payer: BC Managed Care – PPO | Admitting: Family Medicine

## 2021-10-13 ENCOUNTER — Encounter: Payer: Self-pay | Admitting: Family Medicine

## 2021-10-13 VITALS — BP 129/82 | HR 83 | Temp 100.1°F | Resp 18 | Ht 64.0 in | Wt 142.0 lb

## 2021-10-13 DIAGNOSIS — R509 Fever, unspecified: Secondary | ICD-10-CM

## 2021-10-13 DIAGNOSIS — J069 Acute upper respiratory infection, unspecified: Secondary | ICD-10-CM | POA: Diagnosis not present

## 2021-10-13 MED ORDER — FLUTICASONE PROPIONATE 50 MCG/ACT NA SUSP: 2.0000 | Freq: Every day | NASAL | 6 refills | Status: DC

## 2021-10-13 MED ORDER — GUAIFENESIN ER 600 MG PO TB12
600.0000 mg | ORAL_TABLET | Freq: Two times a day (BID) | ORAL | 0 refills | Status: AC
Start: 2021-10-13 — End: 2021-10-23

## 2021-10-13 MED ORDER — PREDNISONE 20 MG PO TABS
40.0000 mg | ORAL_TABLET | Freq: Every day | ORAL | 0 refills | Status: AC
Start: 2021-10-13 — End: 2021-10-18

## 2021-10-13 NOTE — Progress Notes (Signed)
Subjective:  Patient ID: Dawn Rogers, female    DOB: 18-Aug-1961, 60 y.o.   MRN: [id number]  Patient Care Team: Baruch Gouty, FNP as PCP - General (Family Medicine) Fanny Skates, MD as Consulting Physician (General Surgery) Truitt Merle, MD as Consulting Physician (Hematology) Gery Pray, MD as Consulting Physician (Radiation Oncology) Alla Feeling, NP as Nurse Practitioner (Nurse Practitioner)   Chief Complaint:  flu/head cold-like symptoms (Sinus congestion, cough, post-nasal, mucus, some aches, fatigue) and URI   HPI: Dawn Rogers is a 60 y.o. female presenting on 10/13/2021 for flu/head cold-like symptoms (Sinus congestion, cough, post-nasal, mucus, some aches, fatigue) and URI   Pt presents today with ongoing cough, congestion, sore throat, headache, and postnasal drainage for over 1.5 weeks. Her daughter recently recovered from [place]. Pt states she took a home COVID test this morning and it was negative. She has been using decongestants at home without relief of symptoms.   URI  This is a new problem. The current episode started 1 to 4 weeks ago. The problem has been waxing and waning. The maximum temperature recorded prior to her arrival was 100.4 - 100.9 F. Associated symptoms include congestion, coughing, headaches, rhinorrhea and a sore throat. Pertinent negatives include no abdominal pain, chest pain, diarrhea, dysuria, ear pain, joint pain, joint swelling, nausea, neck pain, plugged ear sensation, rash, sinus pain, sneezing, swollen glands, vomiting or wheezing. She has tried decongestant and acetaminophen for the symptoms. The treatment provided mild relief.    Relevant past medical, surgical, family, and social history reviewed and updated as indicated.  Allergies and medications reviewed and updated. Data reviewed: Chart in Epic.   Past Medical History:  Diagnosis Date   Anxiety    Arthritis    Cancer ([place]) 08/02/2018   left breast cancer   Family  history of breast cancer    Family history of esophageal cancer    Family history of lymphoma    Family history of ovarian cancer    GERD (gastroesophageal reflux disease)    Hypothyroidism    Neck pain    Personal history of radiation therapy     Past Surgical History:  Procedure Laterality Date   ANKLE FRACTURE SURGERY Left    BREAST LUMPECTOMY Left 08/2018   BREAST LUMPECTOMY WITH RADIOACTIVE SEED AND SENTINEL LYMPH NODE BIOPSY Left 08/31/2018   Procedure: LEFT DOUBLE BRACKETED LUMPECTOMY WITH RADIOACTIVE SEED X'S 2, LEFT SENTINEL LYMPH NODE BIOPSY, INJECT BLUE DYE LEFT BREAST;  Surgeon: Fanny Skates, MD;  Location: [facility];  Service: General;  Laterality: Left;   CHOLECYSTECTOMY     TUBAL LIGATION      Social History   Socioeconomic History   Marital status: Married    Spouse name: Not on file   Number of children: Not on file   Years of education: Not on file   Highest education level: Not on file  Occupational History   Not on file  Tobacco Use   Smoking status: Never   Smokeless tobacco: Never  Substance and Sexual Activity   Alcohol use: Never   Drug use: Never   Sexual activity: Not on file  Other Topics Concern   Not on file  Social History Narrative   Not on file   Social Determinants of Health   Financial Resource Strain: Not on file  Food Insecurity: Not on file  Transportation Needs: Not on file  Physical Activity: Not on file  Stress: Not on file  Social Connections: Not on file  Intimate Partner Violence: Not on file    Outpatient Encounter Medications as of 10/13/2021  Medication Sig   cyclobenzaprine (FLEXERIL) 5 MG tablet Take 5 mg by mouth 3 (three) times daily as needed for muscle spasms.   DULoxetine (CYMBALTA) 30 MG capsule TAKE 1 CAPSULE BY MOUTH EVERY DAY   ergocalciferol (VITAMIN D2) 50000 units capsule Take 50,000 Units by mouth once a week.   esomeprazole (NEXIUM) 40 MG capsule Take 1 capsule (40 mg total) by  mouth daily.   fluticasone (FLONASE) 50 MCG/ACT nasal spray Place 2 sprays into both nostrils daily.   guaiFENesin (MUCINEX) 600 MG 12 hr tablet Take 1 tablet (600 mg total) by mouth 2 (two) times daily for 10 days.   levothyroxine (SYNTHROID, LEVOTHROID) 25 MCG tablet Take 25 mcg by mouth daily before breakfast.   predniSONE (DELTASONE) 20 MG tablet Take 2 tablets (40 mg total) by mouth daily with breakfast for 5 days.   No facility-administered encounter medications on file as of 10/13/2021.    No Known Allergies  Review of Systems  Constitutional:  Positive for activity change, chills and fever. Negative for appetite change, diaphoresis, fatigue and unexpected weight change.  HENT:  Positive for congestion, postnasal drip, rhinorrhea and sore throat. Negative for ear pain, sinus pressure, sinus pain and sneezing.   Respiratory:  Positive for cough. Negative for shortness of breath and wheezing.   Cardiovascular:  Negative for chest pain.  Gastrointestinal:  Negative for abdominal pain, diarrhea, nausea and vomiting.  Genitourinary:  Negative for decreased urine volume and dysuria.  Musculoskeletal:  Negative for joint pain and neck pain.  Skin:  Negative for rash.  Neurological:  Positive for headaches. Negative for dizziness, tremors, seizures, syncope, facial asymmetry, speech difficulty, weakness, light-headedness and numbness.  Psychiatric/Behavioral:  Negative for confusion.   All other systems reviewed and are negative.      Objective:  BP 129/82   Pulse 83   Temp 100.1 F (37.8 C)   Resp 18   Ht _0  (1.626 m)   Wt 142 lb (64.4 kg)   SpO2 99%   BMI 24.37 kg/m    Wt Readings from Last 3 Encounters:  10/13/21 142 lb (64.4 kg)  09/16/21 138 lb 9.6 oz (62.9 kg)  04/16/21 139 lb 8 oz (63.3 kg)    Physical Exam Vitals and nursing note reviewed.  Constitutional:      General: She is not in acute distress.    Appearance: Normal appearance. She is well-developed and  well-groomed. She is not ill-appearing, toxic-appearing or diaphoretic.  HENT:     Head: Normocephalic and atraumatic.     Jaw: There is normal jaw occlusion.     Right Ear: Hearing, ear canal and external ear normal. A middle ear effusion is present. Tympanic membrane is not perforated or erythematous.     Left Ear: Hearing, ear canal and external ear normal. A middle ear effusion is present. Tympanic membrane is not perforated or erythematous.     Nose: Congestion and rhinorrhea present. Rhinorrhea is clear.     Mouth/Throat:     Lips: Pink.     Mouth: Mucous membranes are moist.     Pharynx: Oropharynx is clear. Uvula midline. Posterior oropharyngeal erythema present. No oropharyngeal exudate.     Tonsils: No tonsillar exudate or tonsillar abscesses.  Eyes:     General: Lids are normal.     Extraocular Movements: Extraocular movements intact.  Conjunctiva/sclera: Conjunctivae normal.     Pupils: Pupils are equal, round, and reactive to light.  Neck:     Thyroid: No thyroid mass, thyromegaly or thyroid tenderness.     Vascular: No carotid bruit or JVD.     Trachea: Trachea and phonation normal.  Cardiovascular:     Rate and Rhythm: Normal rate and regular rhythm.     Chest Wall: PMI is not displaced.     Pulses: Normal pulses.     Heart sounds: Normal heart sounds. No murmur heard.   No friction rub. No gallop.  Pulmonary:     Effort: Pulmonary effort is normal. No respiratory distress.     Breath sounds: Normal breath sounds. No stridor. No wheezing, rhonchi or rales.  Chest:     Chest wall: No tenderness.  Abdominal:     General: There is no abdominal bruit.     Palpations: There is no hepatomegaly or splenomegaly.  Musculoskeletal:        General: Normal range of motion.     Cervical back: Normal range of motion and neck supple.     Right lower leg: No edema.     Left lower leg: No edema.  Lymphadenopathy:     Cervical: No cervical adenopathy.  Skin:    General:  Skin is warm and dry.     Capillary Refill: Capillary refill takes less than 2 seconds.     Coloration: Skin is not cyanotic, jaundiced or pale.     Findings: No rash.  Neurological:     General: No focal deficit present.     Mental Status: She is alert and oriented to person, place, and time.     Sensory: Sensation is intact.     Motor: Motor function is intact.     Coordination: Coordination is intact.     Gait: Gait is intact.     Deep Tendon Reflexes: Reflexes are normal and symmetric.  Psychiatric:        Attention and Perception: Attention and perception normal.        Mood and Affect: Mood and affect normal.        Speech: Speech normal.        Behavior: Behavior normal. Behavior is cooperative.        Thought Content: Thought content normal.        Cognition and Memory: Cognition and memory normal.        Judgment: Judgment normal.    Results for orders placed or performed in visit on 09/16/21  Anemia Profile B  Result Value Ref Range   Total Iron Binding Capacity 289 250 - 450 ug/dL   UIBC 221 131 - 425 ug/dL   Iron 68 27 - 159 ug/dL   Iron Saturation 24 15 - 55 %   Ferritin 90 15 - 150 ng/mL   Vitamin B-12 462 232 - 1,245 pg/mL   Folate 15.6 >3.0 ng/mL   WBC 8.5 3.4 - 10.8 x10E3/uL   RBC 4.36 3.77 - 5.28 x10E6/uL   Hemoglobin 14.4 11.1 - 15.9 g/dL   Hematocrit 43.0 34.0 - 46.6 %   MCV 99 (H) 79 - 97 fL   MCH 33.0 26.6 - 33.0 pg   MCHC 33.5 31.5 - 35.7 g/dL   RDW 11.8 11.7 - 15.4 %   Platelets 262 150 - 450 x10E3/uL   Neutrophils 69 Not Estab. %   Lymphs 20 Not Estab. %   Monocytes 8 Not Estab. %   Eos  2 Not Estab. %   Basos 1 Not Estab. %   Neutrophils Absolute 5.9 1.4 - 7.0 x10E3/uL   Lymphocytes Absolute 1.7 0.7 - 3.1 x10E3/uL   Monocytes Absolute 0.7 0.1 - 0.9 x10E3/uL   EOS (ABSOLUTE) 0.2 0.0 - 0.4 x10E3/uL   Basophils Absolute 0.0 0.0 - 0.2 x10E3/uL   Immature Granulocytes 0 Not Estab. %   Immature Grans (Abs) 0.0 0.0 - 0.1 x10E3/uL   Retic Ct Pct  1.7 0.6 - 2.6 %  CMP14+EGFR  Result Value Ref Range   Glucose 95 70 - 99 mg/dL   BUN 9 6 - 24 mg/dL   Creatinine, Ser 0.70 0.57 - 1.00 mg/dL   eGFR 100 >59 mL/min/1.73   BUN/Creatinine Ratio 13 9 - 23   Sodium 140 134 - 144 mmol/L   Potassium 4.8 3.5 - 5.2 mmol/L   Chloride 101 96 - 106 mmol/L   CO2 25 20 - 29 mmol/L   Calcium 9.6 8.7 - 10.2 mg/dL   Total Protein 6.3 6.0 - 8.5 g/dL   Albumin 4.2 3.8 - 4.9 g/dL   Globulin, Total 2.1 1.5 - 4.5 g/dL   Albumin/Globulin Ratio 2.0 1.2 - 2.2   Bilirubin Total 0.4 0.0 - 1.2 mg/dL   Alkaline Phosphatase 71 44 - 121 IU/L   AST 21 0 - 40 IU/L   ALT 21 0 - 32 IU/L  Lipid panel  Result Value Ref Range   Cholesterol, Total 213 (H) 100 - 199 mg/dL   Triglycerides 130 0 - 149 mg/dL   HDL 72 >39 mg/dL   VLDL Cholesterol Cal 23 5 - 40 mg/dL   LDL Chol Calc (NIH) 118 (H) 0 - 99 mg/dL   Chol/HDL Ratio 3.0 0.0 - 4.4 ratio  Thyroid Panel With TSH  Result Value Ref Range   TSH 2.290 0.450 - 4.500 uIU/mL   T4, Total 9.6 4.5 - 12.0 ug/dL   T3 Uptake Ratio 25 24 - 39 %   Free Thyroxine Index 2.4 1.2 - 4.9  VITAMIN D 25 Hydroxy (Vit-D Deficiency, Fractures)  Result Value Ref Range   Vit D, 25-Hydroxy 70.6 30.0 - 100.0 ng/mL  ANA,IFA RA Diag Pnl w/rflx Tit/Patn  Result Value Ref Range   ANA Titer 1 Negative    Rhuematoid fact SerPl-aCnc <10.0 <11.5 IU/mL   Cyclic Citrullin Peptide Ab 3 0 - 19 units     Influenza negative.   Pertinent labs & imaging results that were available during my care of the patient were reviewed by me and considered in my medical decision making.  Assessment & Plan:  Isyss was seen today for flu/head cold-like symptoms and uri.  Diagnoses and all orders for this visit:  URI with cough and congestion Fever and chills Influenza negative. COVID pending. Will initiate flonase, Mucinex, and burst with steroids. If COVID is negative, will initiate doxycycline as symptoms have been present for over 7 days. Pt aware to  report any new, worsening, or persistent symptoms.  -     Veritor Flu A/B Waived -     Novel Coronavirus, NAA ([facility]) -     fluticasone (FLONASE) 50 MCG/ACT nasal spray; Place 2 sprays into both nostrils daily. -     guaiFENesin (MUCINEX) 600 MG 12 hr tablet; Take 1 tablet (600 mg total) by mouth 2 (two) times daily for 10 days. -     predniSONE (DELTASONE) 20 MG tablet; Take 2 tablets (40 mg total) by mouth daily with breakfast for 5  days.    Continue all other maintenance medications.  Follow up plan: Return if symptoms worsen or fail to improve.   Continue healthy lifestyle choices, including diet (rich in fruits, vegetables, and lean proteins, and low in salt and simple carbohydrates) and exercise (at least 30 minutes of moderate physical activity daily).  Educational handout given for URI  The above assessment and management plan was discussed with the patient. The patient verbalized understanding of and has agreed to the management plan. Patient is aware to call the clinic if they develop any new symptoms or if symptoms persist or worsen. Patient is aware when to return to the clinic for a follow-up visit. Patient educated on when it is appropriate to go to the emergency department.   Monia Pouch, FNP-C [facility] [phone]

## 2021-10-14 LAB — VERITOR FLU A/B WAIVED
Influenza A: NEGATIVE
Influenza B: NEGATIVE

## 2021-10-14 LAB — NOVEL CORONAVIRUS, NAA: SARS-CoV-2, NAA: NOT DETECTED

## 2021-10-18 ENCOUNTER — Inpatient Hospital Stay: Payer: BC Managed Care – PPO | Admitting: Nurse Practitioner

## 2021-10-18 ENCOUNTER — Inpatient Hospital Stay: Payer: BC Managed Care – PPO

## 2021-10-18 NOTE — Progress Notes (Deleted)
[place]   [phone] [phone]   Clinic Follow up Note   Patient Care Team: Baruch Gouty, FNP as PCP - General (Family Medicine) Fanny Skates, MD as Consulting Physician (General Surgery) Truitt Merle, MD as Consulting Physician (Hematology) Gery Pray, MD as Consulting Physician (Radiation Oncology) Alla Feeling, NP as Nurse Practitioner (Nurse Practitioner) 10/18/2021  CHIEF COMPLAINT: Follow up left breast cancer   SUMMARY OF ONCOLOGIC HISTORY: Oncology History Overview Note  Cancer Staging Malignant neoplasm of upper-inner quadrant of left breast in female, estrogen receptor positive ([place]) Staging form: Breast, AJCC 8th Edition - Clinical stage from 07/23/2018: Stage IA (cT1b, cN0, cM0, G1, ER+, PR+, HER2-) - Signed by Truitt Merle, MD on 08/01/2018 - Pathologic stage from 08/31/2018: Stage IA (pT1b, pN0, cM0, G1, ER+, PR+, HER2-) - Signed by Truitt Merle, MD on 11/26/2018     Malignant neoplasm of upper-inner quadrant of left breast in female, estrogen receptor positive ([place])  07/13/2018 Mammogram   07/13/2018 Screening Mammogram IMPRESSION: Further evaluation is suggested for possible distortion in the left breast   07/19/2018 Breast US    07/19/2018 Breast US IMPRESSION: 0.6 cm irregular mass with associated architectural distortion in the 9:30 position of the left breast 4 cm from the nipple. Findings are suspicious for malignancy.   Negative for left axillary lymphadenopathy   07/19/2018 Mammogram   07/19/2018 Diagnostic Mammogram IMPRESSION: 0.6 cm irregular mass with associated architectural distortion in the 9:30 position of the left breast 4 cm from the nipple. Findings are suspicious for malignancy.   Negative for left axillary lymphadenopathy.   07/23/2018 Cancer Staging   Staging form: Breast, AJCC 8th Edition - Clinical stage from 07/23/2018: Stage IA (cT1b, cN0, cM0, G1, ER+, PR+, HER2-) - Signed by Truitt Merle, MD on  08/01/2018    07/23/2018 Receptors her2   ADDITIONAL INFORMATION: PROGNOSTIC INDICATORS Results: IMMUNOHISTOCHEMICAL AND MORPHOMETRIC ANALYSIS PERFORMED MANUALLY The tumor cells are Negative for Her2 (1+). Estrogen Receptor: 90%, POSITIVE, STRONG STAINING INTENSITY Progesterone Receptor: 90%, POSITIVE, STRONG STAINING INTENSITY Proliferation Marker Ki67: 10%   07/23/2018 Pathology Results   Diagnosis Breast, left, needle core biopsy, 9:30 o'clock, 4 cm fn - INVASIVE DUCTAL CARCINOMA, GRADE I. SEE NOTE.   07/27/2018 Initial Diagnosis   Malignant neoplasm of upper-inner quadrant of left breast in female, estrogen receptor positive ([place])   08/16/2018 Genetic Testing   The Multi-Cancer Panel offered by [facility] includes sequencing and/or deletion duplication testing of the following 91 genes: AIP, ALK, APC, ATM, AXIN2, BAP1, BARD1, BLM, BMPR1A, BRCA1, BRCA2, BRIP1, BUB1B, CASR, CDC73, CDH1, CDK4, CDKN1B, CDKN1C, CDKN2A, CEBPA, CEP57, CHEK2, CTNNA1, DICER1, DIS3L2, EGFR, ENG, EPCAM, FH, FLCN, GALNT12, GATA2, GPC3, GREM1, HOXB13, HRAS, KIT, MAX, MEN1, MET, MITF, MLH1, MLH3, MSH2, MSH3, MSH6, MUTYH, NBN, NF1, NF2, NTHL1, PALB2, PDGFRA, PHOX2B, PMS2, POLD1, POLE, POT1, PRKAR1A, PTCH1, PTEN, RAD50, RAD51C, RAD51D, RB1, RECQL4, RET, RNF43, RPS20, RUNX1, SDHA, SDHAF2, SDHB, SDHC, SDHD, SMAD4, SMARCA4, SMARCB1, SMARCE1, STK11, SUFU, TERC, TERT, TMEM127, TP53, TSC1, TSC2, VHL, WRN, WT1  Results: Negative, no pathogenic variants identified.  The date of this test report is 08/16/2018.    08/31/2018 Surgery   LEFT DOUBLE BRACKETED LUMPECTOMY WITH RADIOACTIVE SEED X'S 2, LEFT SENTINEL LYMPH NODE BIOPSY, INJECT BLUE DYE LEFT BREAST by Dr. Dalbert Batman 08/31/18   08/31/2018 Pathology Results   Diagnosis 08/31/18  1. Breast, lumpectomy, left - INVASIVE DUCTAL CARCINOMA, [place] GRADE 1 OF 3, 0.7 CM - CALCIFICATIONS ASSOCIATED WITH CARCINOMA - MARGINS UNINVOLVED BY CARCINOMA (0.5  CM; POSTERIOR MARGIN) -  COLUMNAR CELL AND FIBROCYSTIC CHANGES INCLUDING APOCRINE METAPLASIA - PSEUDOANGIOMATOUS STROMAL HYPERPLASIA - PREVIOUS BIOPSY SITE CHANGES PRESENT - SEE ONCOLOGY TABLE AND COMMENT BELOW 2. Lymph node, sentinel, biopsy, left axillary #1 - NO CARCINOMA IDENTIFIED IN ONE LYMPH NODE (0/1) 3. Lymph node, sentinel, biopsy, left axillary #2 - NO CARCINOMA IDENTIFIED IN ONE LYMPH NODE (0/1) 4. Lymph node, sentinel, biopsy, left axillary #3 - NO CARCINOMA IDENTIFIED IN ONE LYMPH NODE (0/1)   08/31/2018 Cancer Staging   Staging form: Breast, AJCC 8th Edition - Pathologic stage from 08/31/2018: Stage IA (pT1b, pN0, cM0, G1, ER+, PR+, HER2-) - Signed by Truitt Merle, MD on 11/26/2018    10/30/2018 - 11/28/2018 Radiation Therapy   Adjuvant Radiation with Dr. Earney Hamburg 10/30/18-11/28/18   11/2018 - 09/2019 Anti-estrogen oral therapy   Tamoxifen 30m daily started 11/2018. Stopped in 09/2019 due to arthritis. She opted to not restart.    06/06/2019 Survivorship   [name]       CURRENT THERAPY: Surveillance   INTERVAL HISTORY: Ms. ADoughtyreturns for follow up as scheduled. Last seen by Dr. FBurr Medico6/10/22. Mammogram 07/2021 was negative.    REVIEW OF SYSTEMS:   Constitutional: Denies fevers, chills or abnormal weight loss Eyes: Denies blurriness of vision Ears, nose, mouth, throat, and face: Denies mucositis or sore throat Respiratory: Denies cough, dyspnea or wheezes Cardiovascular: Denies palpitation, chest discomfort or lower extremity swelling Gastrointestinal:  Denies nausea, heartburn or change in bowel habits Skin: Denies abnormal skin rashes Lymphatics: Denies new lymphadenopathy or easy bruising Neurological:Denies numbness, tingling or new weaknesses Behavioral/Psych: Mood is stable, no new changes  All other systems were reviewed with the patient and are negative.  MEDICAL HISTORY:  Past Medical History:  Diagnosis Date   Anxiety    Arthritis    Cancer ([place] 08/02/2018    left breast cancer   Family history of breast cancer    Family history of esophageal cancer    Family history of lymphoma    Family history of ovarian cancer    GERD (gastroesophageal reflux disease)    Hypothyroidism    Neck pain    Personal history of radiation therapy     SURGICAL HISTORY: Past Surgical History:  Procedure Laterality Date   ANKLE FRACTURE SURGERY Left    BREAST LUMPECTOMY Left 08/2018   BREAST LUMPECTOMY WITH RADIOACTIVE SEED AND SENTINEL LYMPH NODE BIOPSY Left 08/31/2018   Procedure: LEFT DOUBLE BRACKETED LUMPECTOMY WITH RADIOACTIVE SEED X'S 2, LEFT SENTINEL LYMPH NODE BIOPSY, INJECT BLUE DYE LEFT BREAST;  Surgeon: IFanny Skates MD;  [place] [facility] [facility]  Service: General;  Laterality: Left;   CHOLECYSTECTOMY     TUBAL LIGATION      I have reviewed the social history and family history with the patient and they are unchanged from previous note.  ALLERGIES:  has No Known Allergies.  MEDICATIONS:  Current Outpatient Medications  Medication Sig Dispense Refill   cyclobenzaprine (FLEXERIL) 5 MG tablet Take 5 mg by mouth 3 (three) times daily as needed for muscle spasms.     DULoxetine (CYMBALTA) 30 MG capsule TAKE 1 CAPSULE BY MOUTH EVERY DAY 90 capsule 1   ergocalciferol (VITAMIN D2) 50000 units capsule Take 50,000 Units by mouth once a week.     esomeprazole (NEXIUM) 40 MG capsule Take 1 capsule (40 mg total) by mouth daily. 30 capsule 3   fluticasone (FLONASE) 50 MCG/ACT nasal spray Place 2 sprays into both nostrils daily. 1[place]  g 6   guaiFENesin (MUCINEX) 600 MG 12 hr tablet Take 1 tablet (600 mg total) by mouth 2 (two) times daily for 10 days. 20 tablet 0   levothyroxine (SYNTHROID, LEVOTHROID) 25 MCG tablet Take 25 mcg by mouth daily before breakfast.     predniSONE (DELTASONE) 20 MG tablet Take 2 tablets (40 mg total) by mouth daily with breakfast for 5 days. 10 tablet 0   No current facility-administered medications for this visit.     PHYSICAL EXAMINATION: ECOG PERFORMANCE STATUS: {CHL ONC ECOG [phone]}  There were no vitals filed for this visit. There were no vitals filed for this visit.  GENERAL:alert, no distress and comfortable SKIN: skin color, texture, turgor are normal, no rashes or significant lesions EYES: normal, Conjunctiva are pink and non-injected, sclera clear OROPHARYNX:no exudate, no erythema and lips, buccal mucosa, and tongue normal  NECK: supple, thyroid normal size, non-tender, without nodularity LYMPH:  no palpable lymphadenopathy in the cervical, axillary or inguinal LUNGS: clear to auscultation and percussion with normal breathing effort HEART: regular rate & rhythm and no murmurs and no lower extremity edema ABDOMEN:abdomen soft, non-tender and normal bowel sounds Musculoskeletal:no cyanosis of digits and no clubbing  NEURO: alert & oriented x 3 with fluent speech, no focal motor/sensory deficits  LABORATORY DATA:  I have reviewed the data as listed CBC Latest Ref Rng & Units 09/16/2021 04/16/2021 01/13/2021  WBC 3.4 - 10.8 x10E3/uL 8.5 6.2 4.8  Hemoglobin 11.1 - 15.9 g/dL 14.4 12.5 10.5(L)  Hematocrit 34.0 - 46.6 % 43.0 37.4 34.1  Platelets 150 - 450 x10E3/uL 262 234 254     CMP Latest Ref Rng & Units 09/16/2021 04/16/2021 01/13/2021  Glucose 70 - 99 mg/dL 95 79 124(H)  BUN 6 - 24 mg/dL 9 16 10   Creatinine 0.57 - 1.00 mg/dL 0.70 0.86 0.89  Sodium 134 - 144 mmol/L 140 136 139  Potassium 3.5 - 5.2 mmol/L 4.8 3.9 4.8  Chloride 96 - 106 mmol/L 101 102 107  CO2 20 - 29 mmol/L 25 26 24   Calcium 8.7 - 10.2 mg/dL 9.6 9.1 9.0  Total Protein 6.0 - 8.5 g/dL 6.3 6.2(L) 6.3(L)  Total Bilirubin 0.0 - 1.2 mg/dL 0.4 0.3 0.4  Alkaline Phos 44 - 121 IU/L 71 61 53  AST 0 - 40 IU/L 21 20 22   ALT 0 - 32 IU/L 21 18 21       RADIOGRAPHIC STUDIES: I have personally reviewed the radiological images as listed and agreed with the findings in the report. No results found.   ASSESSMENT & PLAN:   No problem-specific Assessment & Plan notes found for this encounter.   No orders of the defined types were placed in this encounter.  All questions were answered. The patient knows to call the clinic with any problems, questions or concerns. No barriers to learning was detected. I spent {CHL ONC TIME VISIT - ALPFX:[id number]} counseling the patient face to face. The total time spent in the appointment was {CHL ONC TIME VISIT - GDJME:[id number]} and more than 50% was on counseling and review of test results     Alla Feeling, NP 10/18/21

## 2021-10-24 NOTE — Progress Notes (Signed)
Dawn Rogers   [phone] [phone]   Clinic Follow up Note   Patient Care Team: Dawn Gouty, FNP as PCP - General (Family Medicine) Dawn Skates, MD as Consulting Physician (General Surgery) Dawn Merle, MD as Consulting Physician (Hematology) Dawn Pray, MD as Consulting Physician (Radiation Oncology) Dawn Feeling, NP as Nurse Practitioner (Nurse Practitioner) 10/25/2021  CHIEF COMPLAINT: Follow up left breast cancer   SUMMARY OF ONCOLOGIC HISTORY: Oncology History Overview Note  Cancer Staging Malignant neoplasm of upper-inner quadrant of left breast in female, estrogen receptor positive (Dawn Rogers) Staging form: Breast, AJCC 8th Edition - Clinical stage from 07/23/2018: Stage IA (cT1b, cN0, cM0, G1, ER+, PR+, HER2-) - Signed by Dawn Merle, MD on 08/01/2018 - Pathologic stage from 08/31/2018: Stage IA (pT1b, pN0, cM0, G1, ER+, PR+, HER2-) - Signed by Dawn Merle, MD on 11/26/2018     Malignant neoplasm of upper-inner quadrant of left breast in female, estrogen receptor positive (Dawn Rogers)  07/13/2018 Mammogram   07/13/2018 Screening Mammogram IMPRESSION: Further evaluation is suggested for possible distortion in the left breast   07/19/2018 Breast US    07/19/2018 Breast US IMPRESSION: 0.6 cm irregular mass with associated architectural distortion in the 9:30 position of the left breast 4 cm from the nipple. Findings are suspicious for malignancy.   Negative for left axillary lymphadenopathy   07/19/2018 Mammogram   07/19/2018 Diagnostic Mammogram IMPRESSION: 0.6 cm irregular mass with associated architectural distortion in the 9:30 position of the left breast 4 cm from the nipple. Findings are suspicious for malignancy.   Negative for left axillary lymphadenopathy.   07/23/2018 Cancer Staging   Staging form: Breast, AJCC 8th Edition - Clinical stage from 07/23/2018: Stage IA (cT1b, cN0, cM0, G1, ER+, PR+, HER2-) - Signed by Dawn Merle, MD on  08/01/2018    07/23/2018 Receptors her2   ADDITIONAL INFORMATION: PROGNOSTIC INDICATORS Results: IMMUNOHISTOCHEMICAL AND MORPHOMETRIC ANALYSIS PERFORMED MANUALLY The tumor cells are Negative for Her2 (1+). Estrogen Receptor: 90%, POSITIVE, STRONG STAINING INTENSITY Progesterone Receptor: 90%, POSITIVE, STRONG STAINING INTENSITY Proliferation Marker Ki67: 10%   07/23/2018 Pathology Results   Diagnosis Breast, left, needle core biopsy, 9:30 o'clock, 4 cm fn - INVASIVE DUCTAL CARCINOMA, GRADE I. SEE NOTE.   07/27/2018 Initial Diagnosis   Malignant neoplasm of upper-inner quadrant of left breast in female, estrogen receptor positive (Dawn Rogers)   08/16/2018 Genetic Testing   The Multi-Cancer Panel offered by [facility] includes sequencing and/or deletion duplication testing of the following 91 genes: AIP, ALK, APC, ATM, AXIN2, BAP1, BARD1, BLM, BMPR1A, BRCA1, BRCA2, BRIP1, BUB1B, CASR, CDC73, CDH1, CDK4, CDKN1B, CDKN1C, CDKN2A, CEBPA, CEP57, CHEK2, CTNNA1, DICER1, DIS3L2, EGFR, ENG, EPCAM, FH, FLCN, GALNT12, GATA2, GPC3, GREM1, HOXB13, HRAS, KIT, MAX, MEN1, MET, MITF, MLH1, MLH3, MSH2, MSH3, MSH6, MUTYH, NBN, NF1, NF2, NTHL1, PALB2, PDGFRA, PHOX2B, PMS2, POLD1, POLE, POT1, PRKAR1A, PTCH1, PTEN, RAD50, RAD51C, RAD51D, RB1, RECQL4, RET, RNF43, RPS20, RUNX1, SDHA, SDHAF2, SDHB, SDHC, SDHD, SMAD4, SMARCA4, SMARCB1, SMARCE1, STK11, SUFU, TERC, TERT, TMEM127, TP53, TSC1, TSC2, VHL, WRN, WT1  Results: Negative, no pathogenic variants identified.  The date of this test report is 08/16/2018.    08/31/2018 Surgery   LEFT DOUBLE BRACKETED LUMPECTOMY WITH RADIOACTIVE SEED X'S 2, LEFT SENTINEL LYMPH NODE BIOPSY, INJECT BLUE DYE LEFT BREAST by Dawn Rogers 08/31/18   08/31/2018 Pathology Results   Diagnosis 08/31/18  1. Breast, lumpectomy, left - INVASIVE DUCTAL CARCINOMA, [place] GRADE 1 OF 3, 0.7 CM - CALCIFICATIONS ASSOCIATED WITH CARCINOMA - MARGINS UNINVOLVED BY CARCINOMA (0.5  CM; POSTERIOR MARGIN) -  COLUMNAR CELL AND FIBROCYSTIC CHANGES INCLUDING APOCRINE METAPLASIA - PSEUDOANGIOMATOUS STROMAL HYPERPLASIA - PREVIOUS BIOPSY SITE CHANGES PRESENT - SEE ONCOLOGY TABLE AND COMMENT BELOW 2. Lymph node, sentinel, biopsy, left axillary #1 - NO CARCINOMA IDENTIFIED IN ONE LYMPH NODE (0/1) 3. Lymph node, sentinel, biopsy, left axillary #2 - NO CARCINOMA IDENTIFIED IN ONE LYMPH NODE (0/1) 4. Lymph node, sentinel, biopsy, left axillary #3 - NO CARCINOMA IDENTIFIED IN ONE LYMPH NODE (0/1)   08/31/2018 Cancer Staging   Staging form: Breast, AJCC 8th Edition - Pathologic stage from 08/31/2018: Stage IA (pT1b, pN0, cM0, G1, ER+, PR+, HER2-) - Signed by Dawn Merle, MD on 11/26/2018    10/30/2018 - 11/28/2018 Radiation Therapy   Adjuvant Radiation with Dawn Rogers 10/30/18-11/28/18   11/2018 - 09/2019 Anti-estrogen oral therapy   Tamoxifen 81m daily started 11/2018. Stopped in 09/2019 due to arthritis. She opted to not restart.    06/06/2019 Survivorship   Per Dawn Rue NP       CURRENT THERAPY: Surveillance  INTERVAL HISTORY: Ms. AMeigsreturns for follow up as scheduled. Last seen 04/16/21 by Dawn Rogers Mammogram 07/22/21 shows breast density cat c and negative.  She recently recovered from upper respiratory illness, flu and COVID-negative, required antibiotics and steroids.  She recalls having COVID in fall 2020, she has noticed dyspnea with minimal exertion for Dawn about Dawn year now.  Denies cough or chest pain.  PCP ordered Dawn chest x-ray which was negative in November, she has an echo scheduled 11/17/2021.  She has no prior history of pulmonary disease.  She continues to have arthritis, having recurrent arm pain and numbness 1 year after C-spine surgery.  She was started on Cymbalta which is helping.  She has muscle tightness and aches in her upper back.  Overall pain is not limiting function or quality of life.  Denies concerns in her breast such as new lump/mass, nipple discharge or inversion, or skin  change.  Bowels moving normally, denies black or bloody stools.  She has occasional abdominal bloating without pain, diet related.  She is up-to-date on age-appropriate cancer screenings and vaccines/boosters.  She started red yeast rice for high cholesterol.  All other systems were reviewed with the patient and are negative.  MEDICAL HISTORY:  Past Medical History:  Diagnosis Date   Anxiety    Arthritis    [address] [facility]   left breast cancer   Family history of breast cancer    Family history of esophageal cancer    Family history of lymphoma    Family history of ovarian cancer    GERD (gastroesophageal reflux disease)    Hypothyroidism    Neck pain    Personal history of radiation therapy     SURGICAL HISTORY: Past Surgical History:  Procedure Laterality Date   ANKLE FRACTURE SURGERY Left    BREAST LUMPECTOMY Left 08/2018   BREAST LUMPECTOMY WITH RADIOACTIVE SEED AND SENTINEL LYMPH NODE BIOPSY Left 08/31/2018   Procedure: LEFT DOUBLE BRACKETED LUMPECTOMY WITH RADIOACTIVE SEED X'S 2, LEFT SENTINEL LYMPH NODE BIOPSY, INJECT BLUE DYE LEFT BREAST;  Surgeon: IFanny Skates MD;  [facility]  Service: General;  Laterality: Left;   CHOLECYSTECTOMY     TUBAL LIGATION      I have reviewed the social history and family history with the patient and they are unchanged from previous note.  ALLERGIES:  has No Known Allergies.  MEDICATIONS:  Current Outpatient Medications  Medication Sig Dispense Refill  cyclobenzaprine (FLEXERIL) 5 MG tablet Take 5 mg by mouth 3 (three) times daily as needed for muscle spasms.     DULoxetine (CYMBALTA) 30 MG capsule TAKE 1 CAPSULE BY MOUTH EVERY DAY 90 capsule 1   ergocalciferol (VITAMIN D2) 50000 units capsule Take 50,000 Units by mouth once Dawn week.     esomeprazole (NEXIUM) 40 MG capsule Take 1 capsule (40 mg total) by mouth daily. 30 capsule 3   fluticasone (FLONASE) 50 MCG/ACT nasal spray Place 2 sprays into both  nostrils daily. 16 g 6   levothyroxine (SYNTHROID, LEVOTHROID) 25 MCG tablet Take 25 mcg by mouth daily before breakfast.     No current facility-administered medications for this visit.    PHYSICAL EXAMINATION:  Vitals:   10/25/21 0834  BP: (!) 144/84  Pulse: 77  Resp: 17  Temp: 97.8 F (36.6 C)  SpO2: 99%   Filed Weights   10/25/21 0834  Weight: 140 lb 6.4 oz (63.7 kg)    GENERAL:alert, no distress and comfortable SKIN: No rash EYES: sclera clear NECK: Without mass LYMPH:  no palpable cervical or supraclavicular lymphadenopathy LUNGS: clear with normal breathing effort HEART: regular rate & rhythm, no lower extremity edema ABDOMEN:abdomen soft, non-tender and normal bowel sounds Musculoskeletal: Nonfocal NEURO: alert & oriented x 3 with fluent speech, no focal motor/sensory deficits Breast exam: Breasts are symmetric without nipple discharge or inversion.  S/p left lumpectomy and radiation, incisions completely healed with minimal scar tissue.  No palpable mass or nodularity in either breast or axilla that I could appreciate.  LABORATORY DATA:  I have reviewed the data as listed CBC Latest Ref Rng & Units 10/25/2021 09/16/2021 04/16/2021  WBC 4.0 - 10.5 K/uL 6.1 8.5 6.2  Hemoglobin 12.0 - 15.0 g/dL 13.4 14.4 12.5  Hematocrit 36.0 - 46.0 % 39.8 43.0 37.4  Platelets 150 - 400 K/uL 263 262 234     CMP Latest Ref Rng & Units 10/25/2021 09/16/2021 04/16/2021  Glucose 70 - 99 mg/dL 64(L) 95 79  BUN 6 - 20 mg/dL 11 9 16   Creatinine 0.44 - 1.00 mg/dL 0.78 0.70 0.86  Sodium 135 - 145 mmol/L 139 140 136  Potassium 3.5 - 5.1 mmol/L 3.7 4.8 3.9  Chloride 98 - 111 mmol/L 107 101 102  CO2 22 - 32 mmol/L 23 25 26   Calcium 8.9 - 10.3 mg/dL 9.1 9.6 9.1  Total Protein 6.5 - 8.1 g/dL 6.3(L) 6.3 6.2(L)  Total Bilirubin 0.3 - 1.2 mg/dL 0.4 0.4 0.3  Alkaline Phos 38 - 126 U/L 60 71 61  AST 15 - 41 U/L 18 21 20   ALT 0 - 44 U/L 22 21 18       RADIOGRAPHIC STUDIES: I have  personally reviewed the radiological images as listed and agreed with the findings in the report. No results found.   ASSESSMENT & PLAN: Dawn Rogers is Dawn 60 y.o. female with      1. Malignant Neoplasm of Upper-inner Quadrant of Left Breast, 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 Grade I -diagnosed 07/2018, s/p left breast lumpectomy and adjuvant radiation. due to small tumor size we did not obtain oncotype. She discontinued tamoxifen after 10 months due to arthritis. She is on surveillance now -04/2019 DEXA normal -Survivorship visit 05/2019 -07/2021 mammogram normal, breast density cat C -continue surveillance   2. Dyspnea -she reports mild dyspnea with minimal exertion. No cough or chest pain -exam benign. Recent chest xray was negative 09/2021 -echo pending,  scheduled 11/2021 -she had L side breast radiation, discussed possibility of pulmonary scarring vs cardiotoxicity.  -if echo is normal I would proceed with CT chest and/or referral to pulm -defer to PCP already working this up   3. IDA  -mild anemia 01/2021 -reportedly UTD on colonoscopy  -no anemia today, iron studies pending   4. Bone Health  -Her baseline DEXA from 04/2019 is normal (T-score -0.2). -continue Calcium with Vit D.   -repeat 2023   5. Cervical Spine degeneration and Osteoarthritis -She continues to have diffuse OA in her back, shoulder, knees, feet.  -improved with cymbalta    Disposition: Ms. Treaster is clinically doing well from Dawn breast cancer standpoint, exam is benign, labs unremarkable except BG 64 (po intake 2 hours prior to labs).  She was given orange juice and Dawn snack. Mammogram 07/2021 is negative.  Overall there is no clinical concern for breast cancer recurrence.  She is over 3 years from initial diagnosis, the recurrence risk has decreased.  Continue surveillance.  She is undergoing work-up for dyspnea, recent chest x-ray was negative.  She has an echo scheduled in January.   We discussed the possibility of cardiotoxicity from prior left breast radiation, scarring in the lungs, or other etiology.  If echo is normal I would proceed with CT chest and/or referral to pulmonology. Will defer to PCP.   I encouraged her to continue healthy active lifestyle, staying up-to-date on age-appropriate cancer screenings, vaccines/boosters, avoiding smoking, and limiting alcohol.  She agrees to repeat DEXA in 2023, continues cal/vit D.  She will return for next surveillance visit in 6 months, or sooner if needed.   Orders Placed This Encounter  Procedures   DG Bone Density    Standing Status:   Future    Standing Expiration Date:   10/25/2022    Order Specific Question:   Reason for Exam (SYMPTOM  OR DIAGNOSIS REQUIRED)    Answer:   h/o breast cancer    Order Specific Question:   Is the patient pregnant?    Answer:   No    Order Specific Question:   Preferred imaging location?    Answer:   [facility]   All questions were answered. The patient knows to call the clinic with any problems, questions or concerns. No barriers to learning was detected. I spent 20 minutes counseling the patient face to face. The total time spent in the appointment was 30 minutes and more than 50% was on counseling and review of test results     Dawn Feeling, NP 10/25/21

## 2021-10-25 ENCOUNTER — Inpatient Hospital Stay: Payer: BC Managed Care – PPO

## 2021-10-25 ENCOUNTER — Other Ambulatory Visit: Payer: Self-pay

## 2021-10-25 ENCOUNTER — Inpatient Hospital Stay: Payer: BC Managed Care – PPO | Attending: Nurse Practitioner | Admitting: Nurse Practitioner

## 2021-10-25 ENCOUNTER — Encounter: Payer: Self-pay | Admitting: Nurse Practitioner

## 2021-10-25 VITALS — BP 144/84 | HR 77 | Temp 97.8°F | Resp 17 | Ht 64.0 in | Wt 140.4 lb

## 2021-10-25 DIAGNOSIS — Z170 Estrogen receptor positive status [ER+]: Secondary | ICD-10-CM | POA: Diagnosis not present

## 2021-10-25 DIAGNOSIS — C50212 Malignant neoplasm of upper-inner quadrant of left female breast: Secondary | ICD-10-CM

## 2021-10-25 DIAGNOSIS — M1990 Unspecified osteoarthritis, unspecified site: Secondary | ICD-10-CM | POA: Diagnosis not present

## 2021-10-25 DIAGNOSIS — D649 Anemia, unspecified: Secondary | ICD-10-CM | POA: Diagnosis not present

## 2021-10-25 DIAGNOSIS — Z79810 Long term (current) use of selective estrogen receptor modulators (SERMs): Secondary | ICD-10-CM | POA: Insufficient documentation

## 2021-10-25 DIAGNOSIS — Z923 Personal history of irradiation: Secondary | ICD-10-CM | POA: Diagnosis not present

## 2021-10-25 DIAGNOSIS — M5030 Other cervical disc degeneration, unspecified cervical region: Secondary | ICD-10-CM | POA: Insufficient documentation

## 2021-10-25 DIAGNOSIS — E7800 Pure hypercholesterolemia, unspecified: Secondary | ICD-10-CM | POA: Insufficient documentation

## 2021-10-25 DIAGNOSIS — R200 Anesthesia of skin: Secondary | ICD-10-CM | POA: Insufficient documentation

## 2021-10-25 DIAGNOSIS — Z79899 Other long term (current) drug therapy: Secondary | ICD-10-CM | POA: Insufficient documentation

## 2021-10-25 DIAGNOSIS — R0600 Dyspnea, unspecified: Secondary | ICD-10-CM | POA: Insufficient documentation

## 2021-10-25 DIAGNOSIS — M79603 Pain in arm, unspecified: Secondary | ICD-10-CM | POA: Insufficient documentation

## 2021-10-25 DIAGNOSIS — D500 Iron deficiency anemia secondary to blood loss (chronic): Secondary | ICD-10-CM

## 2021-10-25 LAB — CBC WITH DIFFERENTIAL (CANCER CENTER ONLY)
Abs Immature Granulocytes: 0.03 10*3/uL (ref 0.00–0.07)
Basophils Absolute: 0 10*3/uL (ref 0.0–0.1)
Basophils Relative: 1 %
Eosinophils Absolute: 0.2 10*3/uL (ref 0.0–0.5)
Eosinophils Relative: 3 %
HCT: 39.8 % (ref 36.0–46.0)
Hemoglobin: 13.4 g/dL (ref 12.0–15.0)
Immature Granulocytes: 1 %
Lymphocytes Relative: 25 %
Lymphs Abs: 1.6 10*3/uL (ref 0.7–4.0)
MCH: 32.4 pg (ref 26.0–34.0)
MCHC: 33.7 g/dL (ref 30.0–36.0)
MCV: 96.1 fL (ref 80.0–100.0)
Monocytes Absolute: 0.7 10*3/uL (ref 0.1–1.0)
Monocytes Relative: 12 %
Neutro Abs: 3.6 10*3/uL (ref 1.7–7.7)
Neutrophils Relative %: 58 %
Platelet Count: 263 10*3/uL (ref 150–400)
RBC: 4.14 MIL/uL (ref 3.87–5.11)
RDW: 11.9 % (ref 11.5–15.5)
WBC Count: 6.1 10*3/uL (ref 4.0–10.5)
nRBC: 0 % (ref 0.0–0.2)

## 2021-10-25 LAB — CMP (CANCER CENTER ONLY)
ALT: 22 U/L (ref 0–44)
AST: 18 U/L (ref 15–41)
Albumin: 3.5 g/dL (ref 3.5–5.0)
Alkaline Phosphatase: 60 U/L (ref 38–126)
Anion gap: 9 (ref 5–15)
BUN: 11 mg/dL (ref 6–20)
CO2: 23 mmol/L (ref 22–32)
Calcium: 9.1 mg/dL (ref 8.9–10.3)
Chloride: 107 mmol/L (ref 98–111)
Creatinine: 0.78 mg/dL (ref 0.44–1.00)
GFR, Estimated: >60 mL/min (ref >60)
Glucose, Bld: 64 mg/dL — ABNORMAL LOW (ref 70–99)
Potassium: 3.7 mmol/L (ref 3.5–5.1)
Sodium: 139 mmol/L (ref 135–145)
Total Bilirubin: 0.4 mg/dL (ref 0.3–1.2)
Total Protein: 6.3 g/dL — ABNORMAL LOW (ref 6.5–8.1)

## 2021-10-25 LAB — IRON AND TIBC
Iron: 129 ug/dL (ref 41–142)
Saturation Ratios: 47 % (ref 21–57)
TIBC: 275 ug/dL (ref 236–444)
UIBC: 146 ug/dL (ref 120–384)

## 2021-10-25 LAB — FERRITIN: Ferritin: 53 ng/mL (ref 11–307)

## 2021-10-27 ENCOUNTER — Telehealth: Payer: Self-pay | Admitting: Hematology

## 2021-10-27 NOTE — Telephone Encounter (Signed)
Scheduled follow-up appointment per 12/19 los. Patient is aware.

## 2021-11-04 ENCOUNTER — Encounter: Payer: Self-pay | Admitting: Family Medicine

## 2021-11-04 NOTE — Telephone Encounter (Signed)
Sharyn Lull,  Are you alright to start managing her Levothyroxine?  I can send to pharmacy if so. Pt will be informed to contact her insurance company to see which pharmacy would be cheaper

## 2021-11-05 ENCOUNTER — Other Ambulatory Visit: Payer: Self-pay | Admitting: Family Medicine

## 2021-11-05 DIAGNOSIS — E039 Hypothyroidism, unspecified: Secondary | ICD-10-CM

## 2021-11-05 MED ORDER — LEVOTHYROXINE SODIUM 25 MCG PO TABS: 25.0000 ug | ORAL_TABLET | Freq: Every day | ORAL | 2 refills | Status: DC

## 2021-11-05 NOTE — Telephone Encounter (Signed)
Patient aware and would like to know where [facility] is? Is it mail order?  Please advise and send back to pools

## 2021-11-05 NOTE — Telephone Encounter (Signed)
Patient aware.

## 2021-11-17 ENCOUNTER — Other Ambulatory Visit: Payer: Self-pay

## 2021-11-17 ENCOUNTER — Ambulatory Visit (HOSPITAL_COMMUNITY)
Admission: RE | Admit: 2021-11-17 | Discharge: 2021-11-17 | Disposition: A | Discharge status: Home / Self Care | Payer: BC Managed Care – PPO | Source: Ambulatory Visit | Attending: Family Medicine | Admitting: Family Medicine

## 2021-11-17 DIAGNOSIS — Z923 Personal history of irradiation: Secondary | ICD-10-CM | POA: Insufficient documentation

## 2021-11-17 DIAGNOSIS — R0609 Other forms of dyspnea: Secondary | ICD-10-CM | POA: Diagnosis not present

## 2021-11-17 LAB — ECHOCARDIOGRAM COMPLETE
Area-P 1/2: 2.76 cm2
S' Lateral: 1.9 cm

## 2021-11-17 NOTE — Progress Notes (Signed)
*  PRELIMINARY RESULTS* Echocardiogram 2D Echocardiogram has been performed.  Samuel Germany 11/17/2021, 10:47 AM

## 2021-12-11 ENCOUNTER — Encounter: Payer: Self-pay | Admitting: Family Medicine

## 2021-12-14 ENCOUNTER — Other Ambulatory Visit: Payer: Self-pay | Admitting: Family Medicine

## 2021-12-14 DIAGNOSIS — E559 Vitamin D deficiency, unspecified: Secondary | ICD-10-CM

## 2021-12-14 MED ORDER — ERGOCALCIFEROL 1.25 MG (50000 UT) PO CAPS: 50000.0000 [IU] | ORAL_CAPSULE | ORAL | 1 refills | Status: AC

## 2021-12-17 ENCOUNTER — Encounter: Payer: Self-pay | Admitting: Family Medicine

## 2021-12-17 ENCOUNTER — Ambulatory Visit: Payer: BC Managed Care – PPO | Admitting: Family Medicine

## 2021-12-17 VITALS — BP 123/72 | HR 83 | Temp 97.9°F | Ht 64.0 in | Wt 135.0 lb

## 2021-12-17 DIAGNOSIS — K219 Gastro-esophageal reflux disease without esophagitis: Secondary | ICD-10-CM

## 2021-12-17 DIAGNOSIS — M159 Polyosteoarthritis, unspecified: Secondary | ICD-10-CM | POA: Diagnosis not present

## 2021-12-17 DIAGNOSIS — F411 Generalized anxiety disorder: Secondary | ICD-10-CM

## 2021-12-17 DIAGNOSIS — E039 Hypothyroidism, unspecified: Secondary | ICD-10-CM | POA: Diagnosis not present

## 2021-12-17 DIAGNOSIS — G959 Disease of spinal cord, unspecified: Secondary | ICD-10-CM | POA: Diagnosis not present

## 2021-12-17 DIAGNOSIS — J309 Allergic rhinitis, unspecified: Secondary | ICD-10-CM | POA: Insufficient documentation

## 2021-12-17 DIAGNOSIS — Z170 Estrogen receptor positive status [ER+]: Secondary | ICD-10-CM

## 2021-12-17 DIAGNOSIS — M5412 Radiculopathy, cervical region: Secondary | ICD-10-CM

## 2021-12-17 DIAGNOSIS — E559 Vitamin D deficiency, unspecified: Secondary | ICD-10-CM

## 2021-12-17 DIAGNOSIS — C50212 Malignant neoplasm of upper-inner quadrant of left female breast: Secondary | ICD-10-CM

## 2021-12-17 MED ORDER — PREDNISONE 20 MG PO TABS: 40.0000 mg | ORAL_TABLET | Freq: Every day | ORAL | 0 refills | Status: AC

## 2021-12-17 MED ORDER — LEVOCETIRIZINE DIHYDROCHLORIDE 5 MG PO TABS: 5.0000 mg | ORAL_TABLET | Freq: Every evening | ORAL | 6 refills | Status: DC

## 2021-12-17 NOTE — Progress Notes (Signed)
Subjective:  Patient ID: Dawn Rogers, female    DOB: 02-Jan-1961, 61 y.o.   MRN: [id number]  Patient Care Team: Baruch Gouty, FNP as PCP - General (Family Medicine) Fanny Skates, MD as Consulting Physician (General Surgery) Truitt Merle, MD as Consulting Physician (Hematology) Gery Pray, MD as Consulting Physician (Radiation Oncology) Alla Feeling, NP as Nurse Practitioner (Nurse Practitioner)   Chief Complaint:  Medical Management of Chronic Issues   HPI: Dawn Rogers is a 61 y.o. female presenting on 12/17/2021 for Medical Management of Chronic Issues  1. Polyarticular osteoarthritis 2. Cervical myelopathy with cervical radiculopathy (HCC) Ongoing symptoms, worse with damp weather. Has improved some since initiation of cymbalta. Recent burst of steroids was very beneficial. Was taking Mobic in the past but did not feel this was helpful.   3. Acquired hypothyroidism On repletion therapy. TSH 09/2021 acceptable. No reported hyper- or hypothyroid symptoms.   4. GAD (generalized anxiety disorder) Well controled with cymbalta. Has been tolerating well since initiation.  GAD 7 : Generalized Anxiety Score 12/17/2021 09/16/2021  Nervous, Anxious, on Edge 1 1  Control/stop worrying 1 1  Worry too much - different things 1 1  Trouble relaxing 0 1  Restless 0 0  Easily annoyed or irritable 0 0  Afraid - awful might happen 0 1  Total GAD 7 Score 3 5  Anxiety Difficulty Not difficult at all Not difficult at all    5. Vitamin D deficiency On weekly repletion therapy. Does have ongoing arthralgias due to OA but no new or worsening symptoms. No trouble walking or recent fractures.   6. Gastroesophageal reflux disease without esophagitis Well controlled on PPI therapy. No abdominal pain, belching, sore throat, melena, or hematochezia. No dark or tarry stools.   7. Chronic allergic rhinitis Ongoing symptoms despite daily use of flonase. Has postnasal drip and cough at  times with rhinorrhea. No fever, chills, sore throat or sinus pressure.   8. Malignant neoplasm of upper-inner quadrant of left breast in female, estrogen receptor positive ([place]) Diagnosed in 2019, underwent radiation and lumpectomy. Recent mammogram negative. No longer on tamoxifen due to arthritis. Followed by cardiology on a regular basis.      Relevant past medical, surgical, family, and social history reviewed and updated as indicated.  Allergies and medications reviewed and updated. Data reviewed: Chart in Epic.   Past Medical History:  Diagnosis Date   Anxiety    Arthritis    Cancer ([place]) 08/02/2018   left breast cancer   Family history of breast cancer    Family history of esophageal cancer    Family history of lymphoma    Family history of ovarian cancer    GERD (gastroesophageal reflux disease)    Hypothyroidism    Neck pain    Personal history of radiation therapy     Past Surgical History:  Procedure Laterality Date   ANKLE FRACTURE SURGERY Left    BREAST LUMPECTOMY Left 08/2018   BREAST LUMPECTOMY WITH RADIOACTIVE SEED AND SENTINEL LYMPH NODE BIOPSY Left 08/31/2018   Procedure: LEFT DOUBLE BRACKETED LUMPECTOMY WITH RADIOACTIVE SEED X'S 2, LEFT SENTINEL LYMPH NODE BIOPSY, INJECT BLUE DYE LEFT BREAST;  Surgeon: Fanny Skates, MD;  Location: [facility];  Service: General;  Laterality: Left;   CHOLECYSTECTOMY     TUBAL LIGATION      Social History   Socioeconomic History   Marital status: Married    Spouse name: Not on file   Number  of children: Not on file   Years of education: Not on file   Highest education level: Not on file  Occupational History   Not on file  Tobacco Use   Smoking status: Never   Smokeless tobacco: Never  Substance and Sexual Activity   Alcohol use: Never   Drug use: Never   Sexual activity: Not on file  Other Topics Concern   Not on file  Social History Narrative   Not on file   Social Determinants of Health    Financial Resource Strain: Not on file  Food Insecurity: Not on file  Transportation Needs: Not on file  Physical Activity: Not on file  Stress: Not on file  Social Connections: Not on file  Intimate Partner Violence: Not on file    Outpatient Encounter Medications as of 12/17/2021  Medication Sig   cyclobenzaprine (FLEXERIL) 5 MG tablet Take 5 mg by mouth 3 (three) times daily as needed for muscle spasms.   DULoxetine (CYMBALTA) 30 MG capsule TAKE 1 CAPSULE BY MOUTH EVERY DAY   ergocalciferol (VITAMIN D2) 1.25 MG (50000 UT) capsule Take 1 capsule (50,000 Units total) by mouth once a week.   esomeprazole (NEXIUM) 40 MG capsule Take 1 capsule (40 mg total) by mouth daily.   fluticasone (FLONASE) 50 MCG/ACT nasal spray Place 2 sprays into both nostrils daily.   levocetirizine (XYZAL) 5 MG tablet Take 1 tablet (5 mg total) by mouth every evening.   levothyroxine (SYNTHROID) 25 MCG tablet Take 1 tablet (25 mcg total) by mouth daily before breakfast.   predniSONE (DELTASONE) 20 MG tablet Take 2 tablets (40 mg total) by mouth daily with breakfast for 5 days.   No facility-administered encounter medications on file as of 12/17/2021.    No Known Allergies  Review of Systems  Constitutional:  Negative for activity change, appetite change, chills, diaphoresis, fatigue, fever and unexpected weight change.  HENT:  Positive for congestion, postnasal drip and rhinorrhea.   Eyes: Negative.  Negative for photophobia and visual disturbance.  Respiratory:  Negative for cough, chest tightness and shortness of breath.   Cardiovascular:  Negative for chest pain, palpitations and leg swelling.  Gastrointestinal:  Negative for abdominal pain, blood in stool, constipation, diarrhea, nausea and vomiting.  Endocrine: Negative.   Genitourinary:  Negative for decreased urine volume, difficulty urinating, dysuria, frequency and urgency.  Musculoskeletal:  Positive for arthralgias and joint swelling. Negative  for back pain, gait problem, myalgias, neck pain and neck stiffness.  Skin: Negative.   Allergic/Immunologic: Negative.   Neurological:  Negative for dizziness, tremors, seizures, syncope, facial asymmetry, speech difficulty, weakness, light-headedness, numbness and headaches.  Hematological: Negative.   Psychiatric/Behavioral:  Negative for agitation, behavioral problems, confusion, decreased concentration, dysphoric mood, hallucinations, self-injury, sleep disturbance and suicidal ideas. The patient is nervous/anxious. The patient is not hyperactive.   All other systems reviewed and are negative.      Objective:  BP 123/72    Pulse 83    Temp 97.9 F (36.6 C) (Temporal)    Ht 5\' 4"  (1.626 m)    Wt 135 lb (61.2 kg)    SpO2 98%    BMI 23.17 kg/m    Wt Readings from Last 3 Encounters:  12/17/21 135 lb (61.2 kg)  10/25/21 140 lb 6.4 oz (63.7 kg)  10/13/21 142 lb (64.4 kg)    Physical Exam Vitals and nursing note reviewed.  Constitutional:      Appearance: Normal appearance. She is normal weight.  HENT:  Head: Normocephalic and atraumatic.     Nose: Rhinorrhea present. No congestion. Rhinorrhea is clear.     Right Turbinates: Swollen and pale. Not enlarged.     Left Turbinates: Swollen and pale. Not enlarged.     Right Sinus: No maxillary sinus tenderness or frontal sinus tenderness.     Left Sinus: No maxillary sinus tenderness or frontal sinus tenderness.     Mouth/Throat:     Mouth: Mucous membranes are moist.  Eyes:     Conjunctiva/sclera: Conjunctivae normal.     Pupils: Pupils are equal, round, and reactive to light.  Cardiovascular:     Rate and Rhythm: Normal rate and regular rhythm.     Heart sounds: Normal heart sounds. No murmur heard.   No friction rub. No gallop.  Pulmonary:     Effort: Pulmonary effort is normal.     Breath sounds: Normal breath sounds.  Abdominal:     General: Bowel sounds are normal.     Palpations: Abdomen is soft.  Musculoskeletal:      Cervical back: Neck supple.     Right lower leg: No edema.     Left lower leg: No edema.     Comments: Heberden's Nodules noted to left and right 3rd-5th fingers  Skin:    General: Skin is warm and dry.     Capillary Refill: Capillary refill takes less than 2 seconds.  Neurological:     General: No focal deficit present.     Mental Status: She is alert and oriented to person, place, and time.  Psychiatric:        Mood and Affect: Mood normal.        Behavior: Behavior normal.        Thought Content: Thought content normal.        Judgment: Judgment normal.    Results for orders placed or performed during the hospital encounter of 11/17/21  ECHOCARDIOGRAM COMPLETE  Result Value Ref Range   Area-P 1/2 2.76 cm2   S' Lateral 1.90 cm       Pertinent labs & imaging results that were available during my care of the patient were reviewed by me and considered in my medical decision making.  Assessment & Plan:  Yuki was seen today for medical management of chronic issues.  Diagnoses and all orders for this visit:  Polyarticular osteoarthritis Cervical myelopathy with cervical radiculopathy (HCC) Classic OA symptoms. Worsening stiffness in fingers. Will burst with short course of steroids. Symptomatic care discussed in detail.  -     predniSONE (DELTASONE) 20 MG tablet; Take 2 tablets (40 mg total) by mouth daily with breakfast for 5 days.  Acquired hypothyroidism On repletion therapy and last thyroid panel was normal. No reported symptoms. Continue regimen, will recheck labs at next visit.   GAD (generalized anxiety disorder) Better control with addition of cymbalta to regimen. Continue  Vitamin D deficiency On weekly repletion therapy. Last Vit D level was acceptable. Will recheck at next visit.   Gastroesophageal reflux disease without esophagitis Well controlled on PPI. No red flags present.   Chronic allergic rhinitis Ongoing symptoms despite daily flonase. Will add  daily antihistamine to see if helpful. If not, may trial Singulair.  -     levocetirizine (XYZAL) 5 MG tablet; Take 1 tablet (5 mg total) by mouth every evening.  Malignant neoplasm of upper-inner quadrant of left breast in female, estrogen receptor positive ([place]) In remission, followed by oncology on a regular basis.  Continue all other maintenance medications.  Follow up plan: Return in about 6 months (around 06/16/2022), or if symptoms worsen or fail to improve, for chronic follow up.   Continue healthy lifestyle choices, including diet (rich in fruits, vegetables, and lean proteins, and low in salt and simple carbohydrates) and exercise (at least 30 minutes of moderate physical activity daily).  Educational handout given for health maintenance, OA  The above assessment and management plan was discussed with the patient. The patient verbalized understanding of and has agreed to the management plan. Patient is aware to call the clinic if they develop any new symptoms or if symptoms persist or worsen. Patient is aware when to return to the clinic for a follow-up visit. Patient educated on when it is appropriate to go to the emergency department.   Monia Pouch, FNP-C [facility] [phone]

## 2021-12-28 ENCOUNTER — Other Ambulatory Visit: Payer: Self-pay | Admitting: Family Medicine

## 2021-12-28 DIAGNOSIS — H109 Unspecified conjunctivitis: Secondary | ICD-10-CM

## 2021-12-28 MED ORDER — POLYMYXIN B-TRIMETHOPRIM 10000-0.1 UNIT/ML-% OP SOLN
2.0000 [drp] | OPHTHALMIC | 0 refills | Status: AC
Start: 2021-12-28 — End: 2022-01-04

## 2022-04-02 ENCOUNTER — Other Ambulatory Visit: Payer: Self-pay | Admitting: Family Medicine

## 2022-04-02 DIAGNOSIS — M2550 Pain in unspecified joint: Secondary | ICD-10-CM

## 2022-04-02 DIAGNOSIS — K219 Gastro-esophageal reflux disease without esophagitis: Secondary | ICD-10-CM

## 2022-04-02 DIAGNOSIS — F411 Generalized anxiety disorder: Secondary | ICD-10-CM

## 2022-04-15 ENCOUNTER — Ambulatory Visit
Admission: RE | Admit: 2022-04-15 | Discharge: 2022-04-15 | Disposition: A | Discharge status: Home / Self Care | Payer: BC Managed Care – PPO | Source: Ambulatory Visit | Attending: Nurse Practitioner | Admitting: Nurse Practitioner

## 2022-04-15 DIAGNOSIS — C50212 Malignant neoplasm of upper-inner quadrant of left female breast: Secondary | ICD-10-CM

## 2022-04-22 ENCOUNTER — Other Ambulatory Visit: Payer: Self-pay

## 2022-04-22 DIAGNOSIS — D649 Anemia, unspecified: Secondary | ICD-10-CM

## 2022-04-22 DIAGNOSIS — D500 Iron deficiency anemia secondary to blood loss (chronic): Secondary | ICD-10-CM

## 2022-04-22 DIAGNOSIS — Z170 Estrogen receptor positive status [ER+]: Secondary | ICD-10-CM

## 2022-04-25 ENCOUNTER — Inpatient Hospital Stay: Payer: BC Managed Care – PPO

## 2022-04-25 ENCOUNTER — Inpatient Hospital Stay: Payer: BC Managed Care – PPO | Attending: Hematology | Admitting: Hematology

## 2022-04-25 ENCOUNTER — Other Ambulatory Visit: Payer: Self-pay

## 2022-04-25 ENCOUNTER — Encounter: Payer: Self-pay | Admitting: Hematology

## 2022-04-25 VITALS — BP 141/73 | HR 62 | Temp 98.0°F | Resp 16 | Ht 64.0 in | Wt 137.5 lb

## 2022-04-25 DIAGNOSIS — C50212 Malignant neoplasm of upper-inner quadrant of left female breast: Secondary | ICD-10-CM

## 2022-04-25 DIAGNOSIS — Z170 Estrogen receptor positive status [ER+]: Secondary | ICD-10-CM

## 2022-04-25 DIAGNOSIS — D649 Anemia, unspecified: Secondary | ICD-10-CM

## 2022-04-25 DIAGNOSIS — Z853 Personal history of malignant neoplasm of breast: Secondary | ICD-10-CM | POA: Insufficient documentation

## 2022-04-25 DIAGNOSIS — Z923 Personal history of irradiation: Secondary | ICD-10-CM | POA: Diagnosis not present

## 2022-04-25 DIAGNOSIS — D500 Iron deficiency anemia secondary to blood loss (chronic): Secondary | ICD-10-CM

## 2022-04-25 LAB — CBC WITH DIFFERENTIAL (CANCER CENTER ONLY)
Abs Immature Granulocytes: 0.01 10*3/uL (ref 0.00–0.07)
Basophils Absolute: 0 10*3/uL (ref 0.0–0.1)
Basophils Relative: 0 %
Eosinophils Absolute: 0.1 10*3/uL (ref 0.0–0.5)
Eosinophils Relative: 3 %
HCT: 38.4 % (ref 36.0–46.0)
Hemoglobin: 13.3 g/dL (ref 12.0–15.0)
Immature Granulocytes: 0 %
Lymphocytes Relative: 32 %
Lymphs Abs: 1.4 10*3/uL (ref 0.7–4.0)
MCH: 31.7 pg (ref 26.0–34.0)
MCHC: 34.6 g/dL (ref 30.0–36.0)
MCV: 91.6 fL (ref 80.0–100.0)
Monocytes Absolute: 0.5 10*3/uL (ref 0.1–1.0)
Monocytes Relative: 10 %
Neutro Abs: 2.5 10*3/uL (ref 1.7–7.7)
Neutrophils Relative %: 55 %
Platelet Count: 268 10*3/uL (ref 150–400)
RBC: 4.19 MIL/uL (ref 3.87–5.11)
RDW: 12.1 % (ref 11.5–15.5)
WBC Count: 4.5 10*3/uL (ref 4.0–10.5)
nRBC: 0 % (ref 0.0–0.2)

## 2022-04-25 LAB — IRON AND IRON BINDING CAPACITY (CC-WL,HP ONLY)
Iron: 70 ug/dL (ref 28–170)
Saturation Ratios: 18 % (ref 10.4–31.8)
TIBC: 396 ug/dL (ref 250–450)
UIBC: 326 ug/dL (ref 148–442)

## 2022-04-25 LAB — CMP (CANCER CENTER ONLY)
ALT: 18 U/L (ref 0–44)
AST: 20 U/L (ref 15–41)
Albumin: 4 g/dL (ref 3.5–5.0)
Alkaline Phosphatase: 60 U/L (ref 38–126)
Anion gap: 4 — ABNORMAL LOW (ref 5–15)
BUN: 9 mg/dL (ref 6–20)
CO2: 29 mmol/L (ref 22–32)
Calcium: 9.4 mg/dL (ref 8.9–10.3)
Chloride: 101 mmol/L (ref 98–111)
Creatinine: 0.72 mg/dL (ref 0.44–1.00)
GFR, Estimated: >60 mL/min (ref >60)
Glucose, Bld: 98 mg/dL (ref 70–99)
Potassium: 3.9 mmol/L (ref 3.5–5.1)
Sodium: 134 mmol/L — ABNORMAL LOW (ref 135–145)
Total Bilirubin: 0.4 mg/dL (ref 0.3–1.2)
Total Protein: 6.6 g/dL (ref 6.5–8.1)

## 2022-04-25 LAB — FERRITIN: Ferritin: 13 ng/mL (ref 11–307)

## 2022-04-25 NOTE — Progress Notes (Signed)
[facility]   [phone] [phone]   Clinic Follow up Note   Patient Care Team: Baruch Gouty, FNP as PCP - General (Family Medicine) Fanny Skates, MD as Consulting Physician (General Surgery) Truitt Merle, MD as Consulting Physician (Hematology) Gery Pray, MD as Consulting Physician (Radiation Oncology) Alla Feeling, NP as Nurse Practitioner (Nurse Practitioner)  Date of Service:  04/25/2022  CHIEF COMPLAINT: f/u of left breast cancer  CURRENT THERAPY:  Surveillance  ASSESSMENT & PLAN:  Dawn Rogers is a 61 y.o. post-menopausal female with   1. Malignant Neoplasm of Upper-inner Quadrant of Left Breast, Stage IA (pT1b, pN0, cM0). ER+/PR+, HER2 (-), Grade I -diagnosed 07/2018, s/p left breast lumpectomy and adjuvant radiation. She discontinued tamoxifen after 10 months due to arthritis. She is on surveillance now -most recent mammogram 07/22/21 negative -we discussed the option of annual surveillance breast MRI since she is not on antiestrogens. She will consider this. -she is clinically doing well. Labs reviewed, WNL. Physical exam was unremarkable. There is no clinical concern for recurrence. -continue surveillance    2. Bone Health  -Her baseline DEXA from 04/2019 is normal (T-score -0.2). Repeat 04/15/22 slightly worse with T-score -0.7. -continue Calcium with Vit D.     3. Cervical Spine degeneration and Osteoarthritis -She has diffuse OA, managed with anti-inflammatories as needed -improved on cymbalta   PLAN: -continue surveillance, next mammogram due 07/2022 -lab and f/u with NP Lacie in 6 months   No problem-specific Assessment & Plan notes found for this encounter.   SUMMARY OF ONCOLOGIC HISTORY: Oncology History Overview Note  Cancer Staging Malignant neoplasm of upper-inner quadrant of left breast in female, estrogen receptor positive ([place]) Staging form: Breast, AJCC 8th Edition - Clinical stage from 07/23/2018:  Stage IA (cT1b, cN0, cM0, G1, ER+, PR+, HER2-) - Signed by Truitt Merle, MD on 08/01/2018 - Pathologic stage from 08/31/2018: Stage IA (pT1b, pN0, cM0, G1, ER+, PR+, HER2-) - Signed by Truitt Merle, MD on 11/26/2018     Malignant neoplasm of upper-inner quadrant of left breast in female, estrogen receptor positive ([place])  07/13/2018 Mammogram   07/13/2018 Screening Mammogram IMPRESSION: Further evaluation is suggested for possible distortion in the left breast   07/19/2018 Breast US    07/19/2018 Breast US IMPRESSION: 0.6 cm irregular mass with associated architectural distortion in the 9:30 position of the left breast 4 cm from the nipple. Findings are suspicious for malignancy.   Negative for left axillary lymphadenopathy   07/19/2018 Mammogram   07/19/2018 Diagnostic Mammogram IMPRESSION: 0.6 cm irregular mass with associated architectural distortion in the 9:30 position of the left breast 4 cm from the nipple. Findings are suspicious for malignancy.   Negative for left axillary lymphadenopathy.   07/23/2018 Cancer Staging   Staging form: Breast, AJCC 8th Edition - Clinical stage from 07/23/2018: Stage IA (cT1b, cN0, cM0, G1, ER+, PR+, HER2-) - Signed by Truitt Merle, MD on 08/01/2018   07/23/2018 Receptors her2   ADDITIONAL INFORMATION: PROGNOSTIC INDICATORS Results: IMMUNOHISTOCHEMICAL AND MORPHOMETRIC ANALYSIS PERFORMED MANUALLY The tumor cells are Negative for Her2 (1+). Estrogen Receptor: 90%, POSITIVE, STRONG STAINING INTENSITY Progesterone Receptor: 90%, POSITIVE, STRONG STAINING INTENSITY Proliferation Marker Ki67: 10%   07/23/2018 Pathology Results   Diagnosis Breast, left, needle core biopsy, 9:30 o'clock, 4 cm fn - INVASIVE DUCTAL CARCINOMA, GRADE I. SEE NOTE.   07/27/2018 Initial Diagnosis   Malignant neoplasm of upper-inner quadrant of left breast in female, estrogen receptor positive ([place])   08/16/2018 Genetic  Testing   The Multi-Cancer Panel offered by [facility] includes  sequencing and/or deletion duplication testing of the following 91 genes: AIP, ALK, APC, ATM, AXIN2, BAP1, BARD1, BLM, BMPR1A, BRCA1, BRCA2, BRIP1, BUB1B, CASR, CDC73, CDH1, CDK4, CDKN1B, CDKN1C, CDKN2A, CEBPA, CEP57, CHEK2, CTNNA1, DICER1, DIS3L2, EGFR, ENG, EPCAM, FH, FLCN, GALNT12, GATA2, GPC3, GREM1, HOXB13, HRAS, KIT, MAX, MEN1, MET, MITF, MLH1, MLH3, MSH2, MSH3, MSH6, MUTYH, NBN, NF1, NF2, NTHL1, PALB2, PDGFRA, PHOX2B, PMS2, POLD1, POLE, POT1, PRKAR1A, PTCH1, PTEN, RAD50, RAD51C, RAD51D, RB1, RECQL4, RET, RNF43, RPS20, RUNX1, SDHA, SDHAF2, SDHB, SDHC, SDHD, SMAD4, SMARCA4, SMARCB1, SMARCE1, STK11, SUFU, TERC, TERT, TMEM127, TP53, TSC1, TSC2, VHL, WRN, WT1  Results: Negative, no pathogenic variants identified.  The date of this test report is 08/16/2018.    08/31/2018 Surgery   LEFT DOUBLE BRACKETED LUMPECTOMY WITH RADIOACTIVE SEED X'S 2, LEFT SENTINEL LYMPH NODE BIOPSY, INJECT BLUE DYE LEFT BREAST by Dr. Dalbert Batman 08/31/18   08/31/2018 Pathology Results   Diagnosis 08/31/18  1. Breast, lumpectomy, left - INVASIVE DUCTAL CARCINOMA, [place] GRADE 1 OF 3, 0.7 CM - CALCIFICATIONS ASSOCIATED WITH CARCINOMA - MARGINS UNINVOLVED BY CARCINOMA (0.5 CM; POSTERIOR MARGIN) - COLUMNAR CELL AND FIBROCYSTIC CHANGES INCLUDING APOCRINE METAPLASIA - PSEUDOANGIOMATOUS STROMAL HYPERPLASIA - PREVIOUS BIOPSY SITE CHANGES PRESENT - SEE ONCOLOGY TABLE AND COMMENT BELOW 2. Lymph node, sentinel, biopsy, left axillary #1 - NO CARCINOMA IDENTIFIED IN ONE LYMPH NODE (0/1) 3. Lymph node, sentinel, biopsy, left axillary #2 - NO CARCINOMA IDENTIFIED IN ONE LYMPH NODE (0/1) 4. Lymph node, sentinel, biopsy, left axillary #3 - NO CARCINOMA IDENTIFIED IN ONE LYMPH NODE (0/1)   08/31/2018 Cancer Staging   Staging form: Breast, AJCC 8th Edition - Pathologic stage from 08/31/2018: Stage IA (pT1b, pN0, cM0, G1, ER+, PR+, HER2-) - Signed by Truitt Merle, MD on 11/26/2018   10/30/2018 - 11/28/2018 Radiation Therapy   Adjuvant  Radiation with Dr. Earney Hamburg 10/30/18-11/28/18   11/2018 - 09/2019 Anti-estrogen oral therapy   Tamoxifen 67m daily started 11/2018. Stopped in 09/2019 due to arthritis. She opted to not restart.    06/06/2019 Survivorship   [name]        INTERVAL HISTORY:  Dawn BROWNLEYis here for a follow up of breast cancer. She was last seen by NP Lacie on 10/25/21. She presents to the clinic alone. She reports she is doing well overall. She notes her arthritis pain is stable, managed with anti-inflammatories as needed.  She reports her SOB is also stable and only occurs with exertion. She notes she has some issues with GI gas.   All other systems were reviewed with the patient and are negative.  MEDICAL HISTORY:  Past Medical History:  Diagnosis Date   Anxiety    Arthritis    [place]   [place] breast cancer   Family history of breast cancer    Family history of esophageal cancer    Family history of lymphoma    Family history of ovarian cancer    GERD (gastroesophageal reflux disease)    Hypothyroidism    Neck pain    Personal history of radiation therapy     SURGICAL HISTORY: Past Surgical History:  Procedure Laterality Date   ANKLE FRACTURE SURGERY Left    BREAST LUMPECTOMY Left 08/2018   BREAST LUMPECTOMY WITH RADIOACTIVE SEED AND SENTINEL LYMPH NODE BIOPSY Left 08/31/2018   Procedure: LEFT DOUBLE BRACKETED LUMPECTOMY WITH RADIOACTIVE SEED X'S 2, LEFT SENTINEL LYMPH NODE BIOPSY, INJECT BLUE DYE LEFT BREAST;  Surgeon: IDalbert Batman  Renelda Loma, MD;  Location: [facility];  Service: General;  Laterality: Left;   CHOLECYSTECTOMY     TUBAL LIGATION      I have reviewed the social history and family history with the patient and they are unchanged from previous note.  ALLERGIES:  has No Known Allergies.  MEDICATIONS:  Current Outpatient Medications  Medication Sig Dispense Refill   cyclobenzaprine (FLEXERIL) 5 MG tablet Take 5 mg by mouth 3 (three)  times daily as needed for muscle spasms.     DULoxetine (CYMBALTA) 30 MG capsule TAKE 1 CAPSULE BY MOUTH EVERY DAY 30 capsule 0   esomeprazole (NEXIUM) 40 MG capsule TAKE 1 CAPSULE (40 MG TOTAL) BY MOUTH DAILY. 30 capsule 0   fluticasone (FLONASE) 50 MCG/ACT nasal spray Place 2 sprays into both nostrils daily. 16 g 6   levocetirizine (XYZAL) 5 MG tablet Take 1 tablet (5 mg total) by mouth every evening. 30 tablet 6   levothyroxine (SYNTHROID) 25 MCG tablet Take 1 tablet (25 mcg total) by mouth daily before breakfast. 90 tablet 2   No current facility-administered medications for this visit.    PHYSICAL EXAMINATION: ECOG PERFORMANCE STATUS: 0 - Asymptomatic  Vitals:   04/25/22 1131  BP: (!) 141/73  Pulse: 62  Resp: 16  Temp: 98 F (36.7 C)  SpO2: 100%   Wt Readings from Last 3 Encounters:  04/25/22 137 lb 8 oz (62.4 kg)  12/17/21 135 lb (61.2 kg)  10/25/21 140 lb 6.4 oz (63.7 kg)     GENERAL:alert, no distress and comfortable SKIN: skin color, texture, turgor are normal, no rashes or significant lesions EYES: normal, Conjunctiva are pink and non-injected, sclera clear  NECK: supple, thyroid normal size, non-tender, without nodularity LYMPH:  no palpable lymphadenopathy in the cervical, axillary LUNGS: clear to auscultation and percussion with normal breathing effort HEART: regular rate & rhythm and no murmurs and no lower extremity edema ABDOMEN:abdomen soft, non-tender and normal bowel sounds Musculoskeletal:no cyanosis of digits and no clubbing  NEURO: alert & oriented x 3 with fluent speech, no focal motor/sensory deficits BREAST: No palpable mass, nodules or adenopathy bilaterally. Breast exam benign.   LABORATORY DATA:  I have reviewed the data as listed    Latest Ref Rng & Units 04/25/2022   10:42 AM 10/25/2021    8:24 AM 09/16/2021   11:51 AM  CBC  WBC 4.0 - 10.5 K/uL 4.5  6.1  8.5   Hemoglobin 12.0 - 15.0 g/dL 13.3  13.4  14.4   Hematocrit 36.0 - 46.0 % 38.4   39.8  43.0   Platelets 150 - 400 K/uL 268  263  262         Latest Ref Rng & Units 04/25/2022   10:42 AM 10/25/2021    8:24 AM 09/16/2021   11:51 AM  CMP  Glucose 70 - 99 mg/dL 98  64  95   BUN 6 - 20 mg/dL _0 Creatinine 0.44 - 1.00 mg/dL 0.72  0.78  0.70   Sodium 135 - 145 mmol/L 134  139  140   Potassium 3.5 - 5.1 mmol/L 3.9  3.7  4.8   Chloride 98 - 111 mmol/L 101  107  101   CO2 22 - 32 mmol/L _1 Calcium 8.9 - 10.3 mg/dL 9.4  9.1  9.6   Total Protein 6.5 - 8.1 g/dL 6.6  6.3  6.3   Total Bilirubin 0.3 -  1.2 mg/dL 0.4  0.4  0.4   Alkaline Phos 38 - 126 U/L 60  60  71   AST 15 - 41 U/L _0 ALT 0 - 44 U/L _1 RADIOGRAPHIC STUDIES: I have personally reviewed the radiological images as listed and agreed with the findings in the report. No results found.    No orders of the defined types were placed in this encounter.  All questions were answered. The patient knows to call the clinic with any problems, questions or concerns. No barriers to learning was detected. The total time spent in the appointment was 25 minutes.     Truitt Merle, MD 04/25/2022   I, Wilburn Mylar, am acting as scribe for Truitt Merle, MD.   I have reviewed the above documentation for accuracy and completeness, and I agree with the above.

## 2022-04-30 ENCOUNTER — Other Ambulatory Visit: Payer: Self-pay | Admitting: Family Medicine

## 2022-04-30 DIAGNOSIS — F411 Generalized anxiety disorder: Secondary | ICD-10-CM

## 2022-04-30 DIAGNOSIS — M2550 Pain in unspecified joint: Secondary | ICD-10-CM

## 2022-04-30 DIAGNOSIS — K219 Gastro-esophageal reflux disease without esophagitis: Secondary | ICD-10-CM

## 2022-05-27 ENCOUNTER — Other Ambulatory Visit: Payer: Self-pay | Admitting: Family Medicine

## 2022-05-27 DIAGNOSIS — E559 Vitamin D deficiency, unspecified: Secondary | ICD-10-CM

## 2022-06-03 ENCOUNTER — Encounter: Payer: Self-pay | Admitting: Family Medicine

## 2022-06-03 ENCOUNTER — Ambulatory Visit: Payer: BC Managed Care – PPO | Admitting: Family Medicine

## 2022-06-03 VITALS — BP 132/86 | HR 67 | Temp 98.0°F | Ht 64.0 in | Wt 135.0 lb

## 2022-06-03 DIAGNOSIS — J309 Allergic rhinitis, unspecified: Secondary | ICD-10-CM

## 2022-06-03 DIAGNOSIS — K219 Gastro-esophageal reflux disease without esophagitis: Secondary | ICD-10-CM

## 2022-06-03 DIAGNOSIS — F411 Generalized anxiety disorder: Secondary | ICD-10-CM

## 2022-06-03 DIAGNOSIS — G959 Disease of spinal cord, unspecified: Secondary | ICD-10-CM

## 2022-06-03 DIAGNOSIS — E039 Hypothyroidism, unspecified: Secondary | ICD-10-CM

## 2022-06-03 DIAGNOSIS — E7800 Pure hypercholesterolemia, unspecified: Secondary | ICD-10-CM | POA: Insufficient documentation

## 2022-06-03 DIAGNOSIS — M2550 Pain in unspecified joint: Secondary | ICD-10-CM

## 2022-06-03 DIAGNOSIS — M5412 Radiculopathy, cervical region: Secondary | ICD-10-CM

## 2022-06-03 MED ORDER — FLUTICASONE PROPIONATE 50 MCG/ACT NA SUSP: 2.0000 | Freq: Every day | NASAL | 6 refills | Status: DC

## 2022-06-03 MED ORDER — DULOXETINE HCL 60 MG PO CPEP: 60.0000 mg | ORAL_CAPSULE | Freq: Every day | ORAL | 6 refills | Status: DC

## 2022-06-03 MED ORDER — ESOMEPRAZOLE MAGNESIUM 40 MG PO CPDR: 40.0000 mg | DELAYED_RELEASE_CAPSULE | Freq: Every day | ORAL | 0 refills | Status: DC

## 2022-06-03 MED ORDER — LEVOCETIRIZINE DIHYDROCHLORIDE 5 MG PO TABS
5.0000 mg | ORAL_TABLET | Freq: Every evening | ORAL | 6 refills | Status: DC
Start: 2022-06-03 — End: 2023-06-07

## 2022-06-03 MED ORDER — PREDNISONE 20 MG PO TABS: ORAL_TABLET | ORAL | 0 refills | Status: DC

## 2022-06-03 NOTE — Progress Notes (Signed)
Subjective:  Patient ID: Dawn Rogers, female    DOB: November 29, 1960, 61 y.o.   MRN: [id number]  Patient Care Team: Baruch Gouty, FNP as PCP - General (Family Medicine) Fanny Skates, MD as Consulting Physician (General Surgery) Truitt Merle, MD as Consulting Physician (Hematology) Gery Pray, MD as Consulting Physician (Radiation Oncology) Alla Feeling, NP as Nurse Practitioner (Nurse Practitioner)   Chief Complaint:  Medical Management of Chronic Issues, Hypothyroidism, and nerve pain   HPI: Dawn Rogers is a 61 y.o. female presenting on 06/03/2022 for Medical Management of Chronic Issues, Hypothyroidism, and nerve pain   1. Multiple joint pain 2. Cervical myelopathy with cervical radiculopathy (HCC) Was started on Cymbalta at low dose which was beneficial at first. States over the last 3-4 weeks her pain has been worsening. Denies new injury. She was on gabapentin in the past but does not wish to restart if able. She was also on Mobic in the past which was beneficial.   3. GAD (generalized anxiety disorder) Feels she is managing her anxiety well and the Cymbalta has been slightly beneficial.     06/03/2022    9:47 AM 12/17/2021   11:11 AM 09/16/2021   11:56 AM  GAD 7 : Generalized Anxiety Score  Nervous, Anxious, on Edge 1 1 1   Control/stop worrying 1 1 1   Worry too much - different things 0 1 1  Trouble relaxing 0 0 1  Restless 0 0 0  Easily annoyed or irritable 0 0 0  Afraid - awful might happen 1 0 1  Total GAD 7 Score 3 3 5   Anxiety Difficulty Not difficult at all Not difficult at all Not difficult at all       06/03/2022    9:48 AM 06/03/2022    9:47 AM 09/16/2021   11:55 AM 09/17/2018    2:11 PM  Depression screen PHQ 2/9  Decreased Interest  0 0 0  Down, Depressed, Hopeless  0 0 1  PHQ - 2 Score  0 0 1  Altered sleeping 1  0   Tired, decreased energy 1  2   Change in appetite 1  0   Feeling bad or failure about yourself  0  0   Trouble  concentrating 2  1   Moving slowly or fidgety/restless 0  0   Suicidal thoughts 0  0   PHQ-9 Score   3   Difficult doing work/chores Not difficult at all  Not difficult at all      4. Gastroesophageal reflux disease without esophagitis On PPI therapy and tolerating well. No red flags concerning for esophagitis or malignancy.   5. Chronic allergic rhinitis Does well with Flonase and Xyzal. Does flare with season changes but able to manage symptoms well.   6. Acquired hypothyroidism Currently on 25 mcg daily and tolerating well. Denies hair, skin, or nail changes. No heat or cold intolerance. Does report fatigue and altered sleep, attributes this to worsening pain over last few weeks.  7. Pure hypercholesterolemia Watches diet and stays active on a daily basis. Currently not on medications.      Relevant past medical, surgical, family, and social history reviewed and updated as indicated.  Allergies and medications reviewed and updated. Data reviewed: Chart in Epic.   Past Medical History:  Diagnosis Date   Anxiety    Arthritis    Cancer ([place]) 08/02/2018   left breast cancer   Family history of breast cancer  Family history of esophageal cancer    Family history of lymphoma    Family history of ovarian cancer    GERD (gastroesophageal reflux disease)    Hypothyroidism    Neck pain    Personal history of radiation therapy     Past Surgical History:  Procedure Laterality Date   ANKLE FRACTURE SURGERY Left    BREAST LUMPECTOMY Left 08/2018   BREAST LUMPECTOMY WITH RADIOACTIVE SEED AND SENTINEL LYMPH NODE BIOPSY Left 08/31/2018   Procedure: LEFT DOUBLE BRACKETED LUMPECTOMY WITH RADIOACTIVE SEED X'S 2, LEFT SENTINEL LYMPH NODE BIOPSY, INJECT BLUE DYE LEFT BREAST;  Surgeon: Fanny Skates, MD;  Location: [place];  Service: General;  Laterality: Left;   CHOLECYSTECTOMY     TUBAL LIGATION      Social History   Socioeconomic History   Marital status:  Married    Spouse name: Not on file   Number of children: Not on file   Years of education: Not on file   Highest education level: Not on file  Occupational History   Not on file  Tobacco Use   Smoking status: Never   Smokeless tobacco: Never  Substance and Sexual Activity   Alcohol use: Never   Drug use: Never   Sexual activity: Not on file  Other Topics Concern   Not on file  Social History Narrative   Not on file   Social Determinants of Health   Financial Resource Strain: Not on file  Food Insecurity: Not on file  Transportation Needs: Not on file  Physical Activity: Not on file  Stress: Not on file  Social Connections: Not on file  Intimate Partner Violence: Not on file    Outpatient Encounter Medications as of 06/03/2022  Medication Sig   cyclobenzaprine (FLEXERIL) 5 MG tablet Take 5 mg by mouth 3 (three) times daily as needed for muscle spasms.   DULoxetine (CYMBALTA) 60 MG capsule Take 1 capsule (60 mg total) by mouth daily.   levothyroxine (SYNTHROID) 25 MCG tablet Take 1 tablet (25 mcg total) by mouth daily before breakfast.   Nasal Dilators (NASAL STRIPS) STRP 1 strip by Does not apply route at bedtime. Breathing strips   predniSONE (DELTASONE) 20 MG tablet 2 po at sametime daily for 5 days- start tomorrow   [DISCONTINUED] DULoxetine (CYMBALTA) 30 MG capsule TAKE 1 CAPSULE BY MOUTH EVERY DAY   [DISCONTINUED] esomeprazole (NEXIUM) 40 MG capsule TAKE 1 CAPSULE (40 MG TOTAL) BY MOUTH DAILY.   [DISCONTINUED] fluticasone (FLONASE) 50 MCG/ACT nasal spray Place 2 sprays into both nostrils daily.   [DISCONTINUED] levocetirizine (XYZAL) 5 MG tablet Take 1 tablet (5 mg total) by mouth every evening.   esomeprazole (NEXIUM) 40 MG capsule Take 1 capsule (40 mg total) by mouth daily.   fluticasone (FLONASE) 50 MCG/ACT nasal spray Place 2 sprays into both nostrils daily.   levocetirizine (XYZAL) 5 MG tablet Take 1 tablet (5 mg total) by mouth every evening.   No  facility-administered encounter medications on file as of 06/03/2022.    No Known Allergies  Review of Systems  Constitutional:  Positive for fatigue. Negative for activity change, appetite change, chills, diaphoresis, fever and unexpected weight change.  HENT: Negative.    Eyes: Negative.  Negative for photophobia and visual disturbance.  Respiratory:  Negative for cough, chest tightness and shortness of breath.   Cardiovascular:  Negative for chest pain, palpitations and leg swelling.  Gastrointestinal:  Negative for abdominal pain, blood in stool, constipation, diarrhea, nausea and  vomiting.  Endocrine: Negative.  Negative for cold intolerance, heat intolerance, polydipsia, polyphagia and polyuria.  Genitourinary:  Negative for decreased urine volume, difficulty urinating, dysuria, frequency and urgency.  Musculoskeletal:  Positive for arthralgias, back pain, myalgias, neck pain and neck stiffness. Negative for gait problem and joint swelling.  Skin: Negative.   Allergic/Immunologic: Negative.   Neurological:  Negative for dizziness, tremors, seizures, syncope, facial asymmetry, speech difficulty, weakness, light-headedness, numbness and headaches.  Hematological: Negative.   Psychiatric/Behavioral:  Positive for sleep disturbance. Negative for agitation, behavioral problems, confusion, decreased concentration, dysphoric mood, hallucinations, self-injury and suicidal ideas. The patient is nervous/anxious. The patient is not hyperactive.   All other systems reviewed and are negative.       Objective:  BP 132/86   Pulse 67   Temp 98 F (36.7 C)   Ht 5' 4"  (1.626 m)   Wt 135 lb (61.2 kg)   SpO2 97%   BMI 23.17 kg/m    Wt Readings from Last 3 Encounters:  06/03/22 135 lb (61.2 kg)  04/25/22 137 lb 8 oz (62.4 kg)  12/17/21 135 lb (61.2 kg)    Physical Exam Vitals and nursing note reviewed.  Constitutional:      General: She is not in acute distress.    Appearance: Normal  appearance. She is normal weight. She is not ill-appearing, toxic-appearing or diaphoretic.  HENT:     Head: Normocephalic and atraumatic.     Mouth/Throat:     Mouth: Mucous membranes are moist.  Eyes:     Conjunctiva/sclera: Conjunctivae normal.     Pupils: Pupils are equal, round, and reactive to light.  Cardiovascular:     Rate and Rhythm: Normal rate and regular rhythm.     Heart sounds: Normal heart sounds. No murmur heard.    No friction rub. No gallop.  Pulmonary:     Effort: Pulmonary effort is normal.     Breath sounds: Normal breath sounds.  Musculoskeletal:     Right shoulder: Normal.     Left shoulder: Normal.     Cervical back: Neck supple. No swelling, edema, deformity, erythema, signs of trauma, lacerations, rigidity, spasms, torticollis, tenderness, bony tenderness or crepitus. No pain with movement. Decreased range of motion.     Thoracic back: Normal.     Right lower leg: No edema.     Left lower leg: No edema.  Skin:    General: Skin is warm and dry.     Capillary Refill: Capillary refill takes less than 2 seconds.  Neurological:     General: No focal deficit present.     Mental Status: She is alert and oriented to person, place, and time.  Psychiatric:        Mood and Affect: Mood normal.        Behavior: Behavior normal.        Thought Content: Thought content normal.        Judgment: Judgment normal.     Results for orders placed or performed in visit on 04/25/22  CMP ([facility] only)  Result Value Ref Range   Sodium 134 (L) 135 - 145 mmol/L   Potassium 3.9 3.5 - 5.1 mmol/L   Chloride 101 98 - 111 mmol/L   CO2 29 22 - 32 mmol/L   Glucose, Bld 98 70 - 99 mg/dL   BUN 9 6 - 20 mg/dL   Creatinine 0.72 0.44 - 1.00 mg/dL   Calcium 9.4 8.9 - 10.3 mg/dL   Total Protein  6.6 6.5 - 8.1 g/dL   Albumin 4.0 3.5 - 5.0 g/dL   AST 20 15 - 41 U/L   ALT 18 0 - 44 U/L   Alkaline Phosphatase 60 38 - 126 U/L   Total Bilirubin 0.4 0.3 - 1.2 mg/dL   GFR,  Estimated >60 >60 mL/min   Anion gap 4 (L) 5 - 15  CBC with Differential ([facility] Only)  Result Value Ref Range   WBC Count 4.5 4.0 - 10.5 K/uL   RBC 4.19 3.87 - 5.11 MIL/uL   Hemoglobin 13.3 12.0 - 15.0 g/dL   HCT 38.4 36.0 - 46.0 %   MCV 91.6 80.0 - 100.0 fL   MCH 31.7 26.0 - 34.0 pg   MCHC 34.6 30.0 - 36.0 g/dL   RDW 12.1 11.5 - 15.5 %   Platelet Count 268 150 - 400 K/uL   nRBC 0.0 0.0 - 0.2 %   Neutrophils Relative % 55 %   Neutro Abs 2.5 1.7 - 7.7 K/uL   Lymphocytes Relative 32 %   Lymphs Abs 1.4 0.7 - 4.0 K/uL   Monocytes Relative 10 %   Monocytes Absolute 0.5 0.1 - 1.0 K/uL   Eosinophils Relative 3 %   Eosinophils Absolute 0.1 0.0 - 0.5 K/uL   Basophils Relative 0 %   Basophils Absolute 0.0 0.0 - 0.1 K/uL   Immature Granulocytes 0 %   Abs Immature Granulocytes 0.01 0.00 - 0.07 K/uL  Iron and Iron Binding Capacity ([facility] only)  Result Value Ref Range   Iron 70 28 - 170 ug/dL   TIBC 396 250 - 450 ug/dL   Saturation Ratios 18 10.4 - 31.8 %   UIBC 326 148 - 442 ug/dL  Ferritin  Result Value Ref Range   Ferritin 13 11 - 307 ng/mL       Pertinent labs & imaging results that were available during my care of the patient were reviewed by me and considered in my medical decision making.  Assessment & Plan:  Meenakshi was seen today for medical management of chronic issues, hypothyroidism and nerve pain.  Diagnoses and all orders for this visit:  Acquired hypothyroidism Thyroid disease has been well controlled. Labs are pending. Adjustments to regimen will be made if warranted. Make sure to take medications on an empty stomach with a full glass of water. Make sure to avoid vitamins or supplements for at least 4 hours before and 4 hours after taking medications. Repeat labs in 3 months if adjustments are made and in 6 months if stable.   -     Thyroid Panel With TSH  Multiple joint pain Cervical myelopathy with cervical radiculopathy (HCC) Worsening pain over the  last 3-4 weeks, will burst with steroids to see if beneficial. Water aerobics recommended. Will increase Cymbalta to 60 mg daily. Recheck Vit D. Pt aware to report new, worsening, or persistent symptoms. May need to add Mobic back to daily regimen.  -     DULoxetine (CYMBALTA) 60 MG capsule; Take 1 capsule (60 mg total) by mouth daily. -     VITAMIN D 25 Hydroxy (Vit-D Deficiency, Fractures) -     predniSONE (DELTASONE) 20 MG tablet; 2 po at sametime daily for 5 days- start tomorrow  GAD (generalized anxiety disorder) Fairly controlled, will recheck thyroid function.  -     Thyroid Panel With TSH  Gastroesophageal reflux disease without esophagitis No red flags present. Diet discussed. Avoid fried, spicy, fatty, greasy, and acidic foods. Avoid  caffeine, nicotine, and alcohol. Do not eat 2-3 hours before bedtime and stay upright for at least 1-2 hours after eating. Eat small frequent meals. Avoid NSAID's like motrin and aleve. Medications as prescribed. Report any new or worsening symptoms. Follow up as discussed or sooner if needed.   -     esomeprazole (NEXIUM) 40 MG capsule; Take 1 capsule (40 mg total) by mouth daily. -     CBC with Differential/Platelet  Chronic allergic rhinitis Well controlled with below, will continue.  -     fluticasone (FLONASE) 50 MCG/ACT nasal spray; Place 2 sprays into both nostrils daily. -     levocetirizine (XYZAL) 5 MG tablet; Take 1 tablet (5 mg total) by mouth every evening.  Pure hypercholesterolemia Diet and exercise encouraged. Labs pending.  -     Lipid panel -     CMP14+EGFR -     CBC with Differential/Platelet     Continue all other maintenance medications.  Follow up plan: Return in about 6 months (around 12/04/2022), or if symptoms worsen or fail to improve.   Continue healthy lifestyle choices, including diet (rich in fruits, vegetables, and lean proteins, and low in salt and simple carbohydrates) and exercise (at least 30 minutes of  moderate physical activity daily).   The above assessment and management plan was discussed with the patient. The patient verbalized understanding of and has agreed to the management plan. Patient is aware to call the clinic if they develop any new symptoms or if symptoms persist or worsen. Patient is aware when to return to the clinic for a follow-up visit. Patient educated on when it is appropriate to go to the emergency department.   Monia Pouch, FNP-C [facility] [phone]

## 2022-06-04 LAB — CBC WITH DIFFERENTIAL/PLATELET
Basophils Absolute: 0 10*3/uL (ref 0.0–0.2)
Basos: 1 %
EOS (ABSOLUTE): 0.1 10*3/uL (ref 0.0–0.4)
Eos: 2 %
Hematocrit: 41.2 % (ref 34.0–46.6)
Hemoglobin: 13.7 g/dL (ref 11.1–15.9)
Immature Grans (Abs): 0 10*3/uL (ref 0.0–0.1)
Immature Granulocytes: 0 %
Lymphocytes Absolute: 1.5 10*3/uL (ref 0.7–3.1)
Lymphs: 31 %
MCH: 31.1 pg (ref 26.6–33.0)
MCHC: 33.3 g/dL (ref 31.5–35.7)
MCV: 93 fL (ref 79–97)
Monocytes Absolute: 0.5 10*3/uL (ref 0.1–0.9)
Monocytes: 10 %
Neutrophils Absolute: 2.8 10*3/uL (ref 1.4–7.0)
Neutrophils: 56 %
Platelets: 278 10*3/uL (ref 150–450)
RBC: 4.41 x10E6/uL (ref 3.77–5.28)
RDW: 12.9 % (ref 11.7–15.4)
WBC: 4.9 10*3/uL (ref 3.4–10.8)

## 2022-06-04 LAB — CMP14+EGFR
ALT: 18 IU/L (ref 0–32)
AST: 21 IU/L (ref 0–40)
Albumin/Globulin Ratio: 2 (ref 1.2–2.2)
Albumin: 4.3 g/dL (ref 3.8–4.9)
Alkaline Phosphatase: 65 IU/L (ref 44–121)
BUN/Creatinine Ratio: 8 — ABNORMAL LOW (ref 9–23)
BUN: 6 mg/dL (ref 6–24)
Bilirubin Total: 0.3 mg/dL (ref 0.0–1.2)
CO2: 19 mmol/L — ABNORMAL LOW (ref 20–29)
Calcium: 10.1 mg/dL (ref 8.7–10.2)
Chloride: 101 mmol/L (ref 96–106)
Creatinine, Ser: 0.74 mg/dL (ref 0.57–1.00)
Globulin, Total: 2.2 g/dL (ref 1.5–4.5)
Glucose: 94 mg/dL (ref 70–99)
Potassium: 5 mmol/L (ref 3.5–5.2)
Sodium: 139 mmol/L (ref 134–144)
Total Protein: 6.5 g/dL (ref 6.0–8.5)
eGFR: 93 mL/min/{1.73_m2} (ref >59)

## 2022-06-04 LAB — THYROID PANEL WITH TSH
Free Thyroxine Index: 2.4 (ref 1.2–4.9)
T3 Uptake Ratio: 24 % (ref 24–39)
T4, Total: 9.9 ug/dL (ref 4.5–12.0)
TSH: 2.11 u[IU]/mL (ref 0.450–4.500)

## 2022-06-04 LAB — LIPID PANEL
Chol/HDL Ratio: 3.1 ratio (ref 0.0–4.4)
Cholesterol, Total: 240 mg/dL — ABNORMAL HIGH (ref 100–199)
HDL: 78 mg/dL (ref >39)
LDL Chol Calc (NIH): 139 mg/dL — ABNORMAL HIGH (ref 0–99)
Triglycerides: 130 mg/dL (ref 0–149)
VLDL Cholesterol Cal: 23 mg/dL (ref 5–40)

## 2022-06-04 LAB — VITAMIN D 25 HYDROXY (VIT D DEFICIENCY, FRACTURES): Vit D, 25-Hydroxy: 85.9 ng/mL (ref 30.0–100.0)

## 2022-06-07 ENCOUNTER — Other Ambulatory Visit: Payer: Self-pay | Admitting: Family Medicine

## 2022-06-07 DIAGNOSIS — E559 Vitamin D deficiency, unspecified: Secondary | ICD-10-CM

## 2022-06-16 ENCOUNTER — Ambulatory Visit: Payer: BC Managed Care – PPO | Admitting: Family Medicine

## 2022-06-20 ENCOUNTER — Encounter: Payer: Self-pay | Admitting: Family Medicine

## 2022-06-20 ENCOUNTER — Other Ambulatory Visit: Payer: Self-pay | Admitting: Family Medicine

## 2022-06-20 DIAGNOSIS — E559 Vitamin D deficiency, unspecified: Secondary | ICD-10-CM

## 2022-06-20 DIAGNOSIS — E039 Hypothyroidism, unspecified: Secondary | ICD-10-CM

## 2022-06-20 MED ORDER — VITAMIN D (ERGOCALCIFEROL) 1.25 MG (50000 UNIT) PO CAPS: 50000.0000 [IU] | ORAL_CAPSULE | ORAL | 3 refills | Status: DC

## 2022-06-20 MED ORDER — LEVOTHYROXINE SODIUM 25 MCG PO TABS
25.0000 ug | ORAL_TABLET | Freq: Every day | ORAL | 3 refills | Status: DC
Start: 2022-06-20 — End: 2023-04-21

## 2022-06-22 ENCOUNTER — Other Ambulatory Visit: Payer: Self-pay | Admitting: Family Medicine

## 2022-06-22 DIAGNOSIS — E039 Hypothyroidism, unspecified: Secondary | ICD-10-CM

## 2022-06-25 ENCOUNTER — Other Ambulatory Visit: Payer: Self-pay | Admitting: Family Medicine

## 2022-06-25 DIAGNOSIS — M2550 Pain in unspecified joint: Secondary | ICD-10-CM

## 2022-06-25 DIAGNOSIS — M5412 Radiculopathy, cervical region: Secondary | ICD-10-CM

## 2022-07-13 ENCOUNTER — Other Ambulatory Visit: Payer: Self-pay | Admitting: Hematology

## 2022-07-13 DIAGNOSIS — Z1231 Encounter for screening mammogram for malignant neoplasm of breast: Secondary | ICD-10-CM

## 2022-07-18 ENCOUNTER — Other Ambulatory Visit: Payer: Self-pay | Admitting: Hematology

## 2022-07-18 ENCOUNTER — Other Ambulatory Visit: Payer: Self-pay | Admitting: Internal Medicine

## 2022-07-18 DIAGNOSIS — Z853 Personal history of malignant neoplasm of breast: Secondary | ICD-10-CM

## 2022-07-25 ENCOUNTER — Ambulatory Visit: Payer: BC Managed Care – PPO

## 2022-07-29 ENCOUNTER — Ambulatory Visit
Admission: RE | Admit: 2022-07-29 | Discharge: 2022-07-29 | Disposition: A | Discharge status: Home / Self Care | Payer: BC Managed Care – PPO | Source: Ambulatory Visit | Attending: Hematology | Admitting: Hematology

## 2022-07-29 DIAGNOSIS — Z853 Personal history of malignant neoplasm of breast: Secondary | ICD-10-CM

## 2022-10-23 NOTE — Progress Notes (Unsigned)
[place]   [phone] [phone]   Clinic Follow up Note   Patient Care Team: Baruch Gouty, FNP as PCP - General (Family Medicine) Fanny Skates, MD as Consulting Physician (General Surgery) Truitt Merle, MD as Consulting Physician (Hematology) Gery Pray, MD as Consulting Physician (Radiation Oncology) Alla Feeling, NP as Nurse Practitioner (Nurse Practitioner) 10/25/2022  CHIEF COMPLAINT: Follow up left breast cancer   SUMMARY OF ONCOLOGIC HISTORY: Oncology History Overview Note  Cancer Staging Malignant neoplasm of upper-inner quadrant of left breast in female, estrogen receptor positive ([place]) Staging form: Breast, AJCC 8th Edition - Clinical stage from 07/23/2018: Stage IA (cT1b, cN0, cM0, G1, ER+, PR+, HER2-) - Signed by Truitt Merle, MD on 08/01/2018 - Pathologic stage from 08/31/2018: Stage IA (pT1b, pN0, cM0, G1, ER+, PR+, HER2-) - Signed by Truitt Merle, MD on 11/26/2018     Malignant neoplasm of upper-inner quadrant of left breast in female, estrogen receptor positive ([place])  07/13/2018 Mammogram   07/13/2018 Screening Mammogram IMPRESSION: Further evaluation is suggested for possible distortion in the left breast   07/19/2018 Breast US    07/19/2018 Breast US IMPRESSION: 0.6 cm irregular mass with associated architectural distortion in the 9:30 position of the left breast 4 cm from the nipple. Findings are suspicious for malignancy.   Negative for left axillary lymphadenopathy   07/19/2018 Mammogram   07/19/2018 Diagnostic Mammogram IMPRESSION: 0.6 cm irregular mass with associated architectural distortion in the 9:30 position of the left breast 4 cm from the nipple. Findings are suspicious for malignancy.   Negative for left axillary lymphadenopathy.   07/23/2018 Cancer Staging   Staging form: Breast, AJCC 8th Edition - Clinical stage from 07/23/2018: Stage IA (cT1b, cN0, cM0, G1, ER+, PR+, HER2-) - Signed by Truitt Merle, MD on  08/01/2018   07/23/2018 Receptors her2   ADDITIONAL INFORMATION: PROGNOSTIC INDICATORS Results: IMMUNOHISTOCHEMICAL AND MORPHOMETRIC ANALYSIS PERFORMED MANUALLY The tumor cells are Negative for Her2 (1+). Estrogen Receptor: 90%, POSITIVE, STRONG STAINING INTENSITY Progesterone Receptor: 90%, POSITIVE, STRONG STAINING INTENSITY Proliferation Marker Ki67: 10%   07/23/2018 Pathology Results   Diagnosis Breast, left, needle core biopsy, 9:30 o'clock, 4 cm fn - INVASIVE DUCTAL CARCINOMA, GRADE I. SEE NOTE.   07/27/2018 Initial Diagnosis   Malignant neoplasm of upper-inner quadrant of left breast in female, estrogen receptor positive ([place])   08/16/2018 Genetic Testing   The Multi-Cancer Panel offered by [facility] includes sequencing and/or deletion duplication testing of the following 91 genes: AIP, ALK, APC, ATM, AXIN2, BAP1, BARD1, BLM, BMPR1A, BRCA1, BRCA2, BRIP1, BUB1B, CASR, CDC73, CDH1, CDK4, CDKN1B, CDKN1C, CDKN2A, CEBPA, CEP57, CHEK2, CTNNA1, DICER1, DIS3L2, EGFR, ENG, EPCAM, FH, FLCN, GALNT12, GATA2, GPC3, GREM1, HOXB13, HRAS, KIT, MAX, MEN1, MET, MITF, MLH1, MLH3, MSH2, MSH3, MSH6, MUTYH, NBN, NF1, NF2, NTHL1, PALB2, PDGFRA, PHOX2B, PMS2, POLD1, POLE, POT1, PRKAR1A, PTCH1, PTEN, RAD50, RAD51C, RAD51D, RB1, RECQL4, RET, RNF43, RPS20, RUNX1, SDHA, SDHAF2, SDHB, SDHC, SDHD, SMAD4, SMARCA4, SMARCB1, SMARCE1, STK11, SUFU, TERC, TERT, TMEM127, TP53, TSC1, TSC2, VHL, WRN, WT1  Results: Negative, no pathogenic variants identified.  The date of this test report is 08/16/2018.    08/31/2018 Surgery   LEFT DOUBLE BRACKETED LUMPECTOMY WITH RADIOACTIVE SEED X'S 2, LEFT SENTINEL LYMPH NODE BIOPSY, INJECT BLUE DYE LEFT BREAST by Dr. Dalbert Batman 08/31/18   08/31/2018 Pathology Results   Diagnosis 08/31/18  1. Breast, lumpectomy, left - INVASIVE DUCTAL CARCINOMA, [place] GRADE 1 OF 3, 0.7 CM - CALCIFICATIONS ASSOCIATED WITH CARCINOMA - MARGINS UNINVOLVED BY CARCINOMA (0.5 CM;  POSTERIOR MARGIN) -  COLUMNAR CELL AND FIBROCYSTIC CHANGES INCLUDING APOCRINE METAPLASIA - PSEUDOANGIOMATOUS STROMAL HYPERPLASIA - PREVIOUS BIOPSY SITE CHANGES PRESENT - SEE ONCOLOGY TABLE AND COMMENT BELOW 2. Lymph node, sentinel, biopsy, left axillary #1 - NO CARCINOMA IDENTIFIED IN ONE LYMPH NODE (0/1) 3. Lymph node, sentinel, biopsy, left axillary #2 - NO CARCINOMA IDENTIFIED IN ONE LYMPH NODE (0/1) 4. Lymph node, sentinel, biopsy, left axillary #3 - NO CARCINOMA IDENTIFIED IN ONE LYMPH NODE (0/1)   08/31/2018 Cancer Staging   Staging form: Breast, AJCC 8th Edition - Pathologic stage from 08/31/2018: Stage IA (pT1b, pN0, cM0, G1, ER+, PR+, HER2-) - Signed by Truitt Merle, MD on 11/26/2018   10/30/2018 - 11/28/2018 Radiation Therapy   Adjuvant Radiation with Dr. Earney Hamburg 10/30/18-11/28/18   11/2018 - 09/2019 Anti-estrogen oral therapy   Tamoxifen 53m daily started 11/2018. Stopped in 09/2019 due to arthritis. She opted to not restart.    06/06/2019 Survivorship   [name]       CURRENT THERAPY: Surveillance   INTERVAL HISTORY: Ms. AHubersreturns for follow up as scheduled. Last seen by Dr. FBurr Medico6/19/23. Mammogram 07/29/22 shows breast density cat C, negative for malignancy. She is doing well from breast cancer standpoint, no concerns. She is overdue for visit with ob/gyn. She is coming up due for colonoscopy in 2024. For past few months she has more gas, bloating, and feeling like she is not completing BM. She had these symptoms before, but improved when she made changes including low fodmap diet. Has not had this in a few years. Denies bleeding, unintentional weight loss, or fatigue.  All other systems were reviewed with the patient and are negative.  MEDICAL HISTORY:  Past Medical History:  Diagnosis Date   Anxiety    Arthritis    [place]   left breast cancer   Family history of breast cancer    Family history of esophageal cancer    Family history of lymphoma    Family  history of ovarian cancer    GERD (gastroesophageal reflux disease)    Hypothyroidism    Neck pain    Personal history of radiation therapy     SURGICAL HISTORY: Past Surgical History:  Procedure Laterality Date   ANKLE FRACTURE SURGERY Left    BREAST LUMPECTOMY Left 08/2018   BREAST LUMPECTOMY WITH RADIOACTIVE SEED AND SENTINEL LYMPH NODE BIOPSY Left 08/31/2018   Procedure: LEFT DOUBLE BRACKETED LUMPECTOMY WITH RADIOACTIVE SEED X'S 2, LEFT SENTINEL LYMPH NODE BIOPSY, INJECT BLUE DYE LEFT BREAST;  Surgeon: IFanny Skates MD;  [facility]  Service: General;  Laterality: Left;   CHOLECYSTECTOMY     TUBAL LIGATION      I have reviewed the social history and family history with the patient and they are unchanged from previous note.  ALLERGIES:  has No Known Allergies.  MEDICATIONS:  Current Outpatient Medications  Medication Sig Dispense Refill   cyclobenzaprine (FLEXERIL) 5 MG tablet Take 5 mg by mouth 3 (three) times daily as needed for muscle spasms.     DULoxetine (CYMBALTA) 60 MG capsule TAKE 1 CAPSULE BY MOUTH EVERY DAY 90 capsule 1   esomeprazole (NEXIUM) 40 MG capsule Take 1 capsule (40 mg total) by mouth daily. 90 capsule 0   fluticasone (FLONASE) 50 MCG/ACT nasal spray Place 2 sprays into both nostrils daily. 16 g 6   levocetirizine (XYZAL) 5 MG tablet Take 1 tablet (5 mg total) by mouth every evening. 30 tablet 6  levothyroxine (SYNTHROID) 25 MCG tablet Take 1 tablet (25 mcg total) by mouth daily before breakfast. 90 tablet 3   Nasal Dilators (NASAL STRIPS) STRP 1 strip by Does not apply route at bedtime. Breathing strips     Vitamin D, Ergocalciferol, (DRISDOL) 1.25 MG (50000 UNIT) CAPS capsule Take 1 capsule (50,000 Units total) by mouth every 7 (seven) days. 5 capsule 3   No current facility-administered medications for this visit.    PHYSICAL EXAMINATION: ECOG PERFORMANCE STATUS: 0 - Asymptomatic  Vitals:   10/25/22 1004  BP: 134/82   Pulse: 74  Resp: 16  Temp: 98.2 F (36.8 C)  SpO2: 98%   Filed Weights   10/25/22 1004  Weight: 145 lb 11.2 oz (66.1 kg)    GENERAL:alert, no distress and comfortable SKIN: no rash  EYES:sclera clear NECK: without mass LYMPH:  no palpable cervical or supraclavicular lymphadenopathy  LUNGS:  normal breathing effort HEART: no lower extremity edema ABDOMEN:abdomen soft, non-tender and normal bowel sounds NEURO: alert & oriented x 3 with fluent speech, no focal motor/sensory deficits Breast Exam: no b/l nipple discharge or inversion. S/p left lumpectomy, incisions completely healed. No palpable mass in either breat or axilla that I could appreciate    LABORATORY DATA:  I have reviewed the data as listed    Latest Ref Rng & Units 10/25/2022    9:36 AM 06/03/2022   10:05 AM 04/25/2022   10:42 AM  CBC  WBC 4.0 - 10.5 K/uL 5.8  4.9  4.5   Hemoglobin 12.0 - 15.0 g/dL 12.4  13.7  13.3   Hematocrit 36.0 - 46.0 % 36.8  41.2  38.4   Platelets 150 - 400 K/uL 302  278  268         Latest Ref Rng & Units 10/25/2022    9:36 AM 06/03/2022   10:05 AM 04/25/2022   10:42 AM  CMP  Glucose 70 - 99 mg/dL 105  94  98   BUN 6 - 20 mg/dL _0 Creatinine 0.44 - 1.00 mg/dL 0.77  0.74  0.72   Sodium 135 - 145 mmol/L 134  139  134   Potassium 3.5 - 5.1 mmol/L 4.5  5.0  3.9   Chloride 98 - 111 mmol/L 101  101  101   CO2 22 - 32 mmol/L _1 Calcium 8.9 - 10.3 mg/dL 9.3  10.1  9.4   Total Protein 6.5 - 8.1 g/dL 7.2  6.5  6.6   Total Bilirubin 0.3 - 1.2 mg/dL 0.6  0.3  0.4   Alkaline Phos 38 - 126 U/L 65  65  60   AST 15 - 41 U/L _2 ALT 0 - 44 U/L _3 RADIOGRAPHIC STUDIES: I have personally reviewed the radiological images as listed and agreed with the findings in the report. No results found.   ASSESSMENT & PLAN: Dawn Rogers is a 61 y.o. female with      1. Malignant Neoplasm of Upper-inner Quadrant of Left Breast, Stage IA (pT1b, pN0, cM0). ER  90%,PR 90%, HER2 (-), Ki67 10%. Grade I -diagnosed 07/2018, s/p left breast lumpectomy and adjuvant radiation. Due to small tumor size, oncotype was not recommended.  -She discontinued tamoxifen after 10 months due to arthritis. She is on surveillance now -Mammogram 07/29/22 normal, breast density cat C. I recommend abbreviated breast MRI  for screening, staggered 6 months apart from mammo. Would start in 01/2023. She will think about it -Ms. Grigg is clinically doing well. Breast exam is benign. Labs are normal. Overall no clinical or radiographic concern for recurrence -continue surveillance -F/up in 1 year, she will see ob/gyn in 6 months for breast exam. She will message me if she decides to do MRI.   2. IDA  -mild anemia 01/2021, then normal -reportedly UTD on colonoscopy  -Today's labs show normal CBC, no anemia, and normal iron panel except ferritin 8.  -Denies bleeding. She has had GI issues in the past, family with celiac disease, and required low FODMAP diet; she may not absorb iron well.  -I recommend to start oral iron po once daily -Due colonoscopy in 2024, she will call [facility] to schedule.   3. Bone Health  -Her baseline DEXA from 04/2019 is normal (T-score -0.2). -repeat DEXA 04/15/22 also normal (T score -0.7) -continue Calcium with Vit D, and weight bearing exercise   4. Cervical Spine degeneration and Osteoarthritis -She continues to have diffuse OA in her back, shoulder, knees, feet.  -initially improved with cymbalta, but plateaud. She would like to come off this. I recommend to wean per PCP   PLAN: -Recent mammogram, DEXA, and today's labs reviewed -Continue breast cancer surveillance, consider screening MRI in 01/2023. Pt will message me if interested -Ob/gyn in 6 months with breast exam -Start oral iron po once daily  -Lab in 6 months, then lab and f/up in 12 months, or sooner if needed -Continue age-appropriate care      Orders Placed This Encounter  Procedures   MM  DIAG BREAST TOMO BILATERAL    Standing Status:   Future    Standing Expiration Date:   10/26/2023    Order Specific Question:   Reason for Exam (SYMPTOM  OR DIAGNOSIS REQUIRED)    Answer:   h/o left br cancer 2019 s/p lumpectomy, RT, and 10 months tamoxifen. not currently on anti-estrogen. dense breast tissue    Order Specific Question:   Is the patient pregnant?    Answer:   No    Order Specific Question:   Preferred imaging location?    Answer:   [facility]   All questions were answered. The patient knows to call the clinic with any problems, questions or concerns. No barriers to learning was detected. I spent 20 minutes counseling the patient face to face. The total time spent in the appointment was 30 minutes and more than 50% was on counseling and review of test results     Alla Feeling, NP 10/25/22

## 2022-10-24 ENCOUNTER — Encounter: Payer: Self-pay | Admitting: *Deleted

## 2022-10-25 ENCOUNTER — Inpatient Hospital Stay: Payer: BC Managed Care – PPO | Attending: Nurse Practitioner | Admitting: Nurse Practitioner

## 2022-10-25 ENCOUNTER — Encounter: Payer: Self-pay | Admitting: Nurse Practitioner

## 2022-10-25 ENCOUNTER — Inpatient Hospital Stay: Payer: BC Managed Care – PPO

## 2022-10-25 VITALS — BP 134/82 | HR 74 | Temp 98.2°F | Resp 16 | Ht 64.0 in | Wt 145.7 lb

## 2022-10-25 DIAGNOSIS — C50212 Malignant neoplasm of upper-inner quadrant of left female breast: Secondary | ICD-10-CM | POA: Insufficient documentation

## 2022-10-25 DIAGNOSIS — Z923 Personal history of irradiation: Secondary | ICD-10-CM | POA: Diagnosis not present

## 2022-10-25 DIAGNOSIS — Z79899 Other long term (current) drug therapy: Secondary | ICD-10-CM | POA: Diagnosis not present

## 2022-10-25 DIAGNOSIS — Z79810 Long term (current) use of selective estrogen receptor modulators (SERMs): Secondary | ICD-10-CM | POA: Diagnosis not present

## 2022-10-25 DIAGNOSIS — E039 Hypothyroidism, unspecified: Secondary | ICD-10-CM | POA: Diagnosis not present

## 2022-10-25 DIAGNOSIS — D500 Iron deficiency anemia secondary to blood loss (chronic): Secondary | ICD-10-CM

## 2022-10-25 DIAGNOSIS — D509 Iron deficiency anemia, unspecified: Secondary | ICD-10-CM | POA: Insufficient documentation

## 2022-10-25 DIAGNOSIS — Z170 Estrogen receptor positive status [ER+]: Secondary | ICD-10-CM | POA: Insufficient documentation

## 2022-10-25 DIAGNOSIS — D649 Anemia, unspecified: Secondary | ICD-10-CM

## 2022-10-25 LAB — CBC WITH DIFFERENTIAL (CANCER CENTER ONLY)
Abs Immature Granulocytes: 0.01 10*3/uL (ref 0.00–0.07)
Basophils Absolute: 0 10*3/uL (ref 0.0–0.1)
Basophils Relative: 0 %
Eosinophils Absolute: 0.2 10*3/uL (ref 0.0–0.5)
Eosinophils Relative: 3 %
HCT: 36.8 % (ref 36.0–46.0)
Hemoglobin: 12.4 g/dL (ref 12.0–15.0)
Immature Granulocytes: 0 %
Lymphocytes Relative: 29 %
Lymphs Abs: 1.6 10*3/uL (ref 0.7–4.0)
MCH: 29.8 pg (ref 26.0–34.0)
MCHC: 33.7 g/dL (ref 30.0–36.0)
MCV: 88.5 fL (ref 80.0–100.0)
Monocytes Absolute: 0.6 10*3/uL (ref 0.1–1.0)
Monocytes Relative: 11 %
Neutro Abs: 3.3 10*3/uL (ref 1.7–7.7)
Neutrophils Relative %: 57 %
Platelet Count: 302 10*3/uL (ref 150–400)
RBC: 4.16 MIL/uL (ref 3.87–5.11)
RDW: 12.6 % (ref 11.5–15.5)
WBC Count: 5.8 10*3/uL (ref 4.0–10.5)
nRBC: 0 % (ref 0.0–0.2)

## 2022-10-25 LAB — CMP (CANCER CENTER ONLY)
ALT: 23 U/L (ref 0–44)
AST: 27 U/L (ref 15–41)
Albumin: 3.4 g/dL — ABNORMAL LOW (ref 3.5–5.0)
Alkaline Phosphatase: 65 U/L (ref 38–126)
Anion gap: 8 (ref 5–15)
BUN: 9 mg/dL (ref 6–20)
CO2: 25 mmol/L (ref 22–32)
Calcium: 9.3 mg/dL (ref 8.9–10.3)
Chloride: 101 mmol/L (ref 98–111)
Creatinine: 0.77 mg/dL (ref 0.44–1.00)
GFR, Estimated: >60 mL/min (ref >60)
Glucose, Bld: 105 mg/dL — ABNORMAL HIGH (ref 70–99)
Potassium: 4.5 mmol/L (ref 3.5–5.1)
Sodium: 134 mmol/L — ABNORMAL LOW (ref 135–145)
Total Bilirubin: 0.6 mg/dL (ref 0.3–1.2)
Total Protein: 7.2 g/dL (ref 6.5–8.1)

## 2022-10-25 LAB — IRON AND IRON BINDING CAPACITY (CC-WL,HP ONLY)
Iron: 51 ug/dL (ref 28–170)
Saturation Ratios: 11 % (ref 10.4–31.8)
TIBC: 447 ug/dL (ref 250–450)
UIBC: 396 ug/dL (ref 148–442)

## 2022-10-25 LAB — FERRITIN: Ferritin: 8 ng/mL — ABNORMAL LOW (ref 11–307)

## 2022-10-26 ENCOUNTER — Telehealth: Payer: Self-pay

## 2022-10-26 NOTE — Telephone Encounter (Signed)
Faxed over last office note from 12/19 to [facility] Dr. Jerelyn Charles as per Cira Rue NP.

## 2022-10-30 ENCOUNTER — Other Ambulatory Visit: Payer: Self-pay | Admitting: Family Medicine

## 2022-10-30 DIAGNOSIS — K219 Gastro-esophageal reflux disease without esophagitis: Secondary | ICD-10-CM

## 2022-12-02 ENCOUNTER — Ambulatory Visit: Payer: BC Managed Care – PPO | Admitting: Family Medicine

## 2022-12-05 ENCOUNTER — Other Ambulatory Visit: Payer: Self-pay | Admitting: Family Medicine

## 2022-12-05 DIAGNOSIS — E559 Vitamin D deficiency, unspecified: Secondary | ICD-10-CM

## 2022-12-05 NOTE — Telephone Encounter (Signed)
Last OV 06/03/22. Last RF 06/20/22. Next OV 12/06/22 Vit d level 06/03/22 85.9

## 2022-12-06 ENCOUNTER — Ambulatory Visit: Payer: BC Managed Care – PPO | Admitting: Family Medicine

## 2022-12-06 ENCOUNTER — Ambulatory Visit (INDEPENDENT_AMBULATORY_CARE_PROVIDER_SITE_OTHER): Payer: BC Managed Care – PPO

## 2022-12-06 ENCOUNTER — Encounter: Payer: Self-pay | Admitting: Family Medicine

## 2022-12-06 VITALS — BP 130/89 | HR 75 | Temp 98.1°F | Ht 64.0 in | Wt 142.6 lb

## 2022-12-06 DIAGNOSIS — R159 Full incontinence of feces: Secondary | ICD-10-CM

## 2022-12-06 DIAGNOSIS — R9389 Abnormal findings on diagnostic imaging of other specified body structures: Secondary | ICD-10-CM

## 2022-12-06 DIAGNOSIS — E7800 Pure hypercholesterolemia, unspecified: Secondary | ICD-10-CM

## 2022-12-06 DIAGNOSIS — E559 Vitamin D deficiency, unspecified: Secondary | ICD-10-CM | POA: Diagnosis not present

## 2022-12-06 DIAGNOSIS — E039 Hypothyroidism, unspecified: Secondary | ICD-10-CM | POA: Diagnosis not present

## 2022-12-06 DIAGNOSIS — R41840 Attention and concentration deficit: Secondary | ICD-10-CM

## 2022-12-06 DIAGNOSIS — K5901 Slow transit constipation: Secondary | ICD-10-CM

## 2022-12-06 MED ORDER — POLYETHYLENE GLYCOL 3350 17 GM/SCOOP PO POWD: 17.0000 g | Freq: Every day | ORAL | 1 refills | Status: AC

## 2022-12-06 NOTE — Addendum Note (Signed)
Addended by: Baruch Gouty on: 12/06/2022 07:42 PM   Modules accepted: Orders

## 2022-12-06 NOTE — Progress Notes (Signed)
Subjective:  Patient ID: Dawn Rogers, female    DOB: [birth year], 62 y.o.   MRN: [id number]  Patient Care Team: Baruch Gouty, FNP as PCP - General (Family Medicine) Fanny Skates, MD as Consulting Physician (General Surgery) Truitt Merle, MD as Consulting Physician (Hematology) Gery Pray, MD as Consulting Physician (Radiation Oncology) Alla Feeling, NP as Nurse Practitioner (Nurse Practitioner)   Chief Complaint:  Medical Management of Chronic Issues (6 month follow up ) and Encopresis (Patient feels like her bowel are leaking)   HPI: Dawn Rogers is a 62 y.o. female presenting on 12/06/2022 for Medical Management of Chronic Issues (6 month follow up ) and Encopresis (Patient feels like her bowel are leaking)   1. Acquired hypothyroidism Compliant with medications - Yes Current medications - levothyroxine 25 mcg Adverse side effects - No Weight - stable  Bowel habit changes - having issues with bowel incontinence at times, this has occurred in the past and recently started happening again. No other associated symptoms. Usually has a normal BM daily. No melena or hematochezia. No associated pain.  Heat or cold intolerance - No Mood changes - Yes Changes in sleep habits - No Fatigue - No Skin, hair, or nail changes - No Tremor - No Palpitations - No Edema - No Shortness of breath - No  2. Vitamin D deficiency Pt is taking oral repletion therapy. Denies bone tenderness, muscle weakness, fracture, and difficulty walking. No worsening arthralgias.  Lab Results  Component Value Date   VD25OH 85.9 06/03/2022   VD25OH 70.6 09/16/2021   Lab Results  Component Value Date   CALCIUM 9.3 10/25/2022     3. Pure hypercholesterolemia Has made dietary changes. Not on medications to treat. Has made significant lifestyle changes.   4. Lack of concentration Pt feels she may have undiagnosed ADHD. States she has all of the following symptoms: lack of concentration, easily  distracted, forgetful, interrupts people during conversations, tries to carry on many tasks at one time and then gets nothing done, and inability to shut mind off.    Relevant past medical, surgical, family, and social history reviewed and updated as indicated.  Allergies and medications reviewed and updated. Data reviewed: Chart in Epic.   Past Medical History:  Diagnosis Date   Anxiety    Arthritis    Cancer ([place]) 08/02/2018   left breast cancer   Family history of breast cancer    Family history of esophageal cancer    Family history of lymphoma    Family history of ovarian cancer    GERD (gastroesophageal reflux disease)    Hypothyroidism    Neck pain    Personal history of radiation therapy     Past Surgical History:  Procedure Laterality Date   ANKLE FRACTURE SURGERY Left    BREAST LUMPECTOMY Left 08/2018   BREAST LUMPECTOMY WITH RADIOACTIVE SEED AND SENTINEL LYMPH NODE BIOPSY Left 08/31/2018   Procedure: LEFT DOUBLE BRACKETED LUMPECTOMY WITH RADIOACTIVE SEED X'S 2, LEFT SENTINEL LYMPH NODE BIOPSY, INJECT BLUE DYE LEFT BREAST;  Surgeon: Fanny Skates, MD;  Location: [facility];  Service: General;  Laterality: Left;   CHOLECYSTECTOMY     TUBAL LIGATION      Social History   Socioeconomic History   Marital status: Married    Spouse name: Not on file   Number of children: Not on file   Years of education: Not on file   Highest education level: Not on file  Occupational History   Not on file  Tobacco Use   Smoking status: Never   Smokeless tobacco: Never  Substance and Sexual Activity   Alcohol use: Never   Drug use: Never   Sexual activity: Not on file  Other Topics Concern   Not on file  Social History Narrative   Not on file   Social Determinants of Health   Financial Resource Strain: Not on file  Food Insecurity: Not on file  Transportation Needs: Not on file  Physical Activity: Not on file  Stress: Not on file  Social Connections:  Not on file  Intimate Partner Violence: Not on file    Outpatient Encounter Medications as of 12/06/2022  Medication Sig   cyclobenzaprine (FLEXERIL) 5 MG tablet Take 5 mg by mouth 3 (three) times daily as needed for muscle spasms.   DULoxetine (CYMBALTA) 60 MG capsule TAKE 1 CAPSULE BY MOUTH EVERY DAY   esomeprazole (NEXIUM) 40 MG capsule TAKE 1 CAPSULE (40 MG TOTAL) BY MOUTH DAILY.   fluticasone (FLONASE) 50 MCG/ACT nasal spray Place 2 sprays into both nostrils daily.   levocetirizine (XYZAL) 5 MG tablet Take 1 tablet (5 mg total) by mouth every evening.   levothyroxine (SYNTHROID) 25 MCG tablet Take 1 tablet (25 mcg total) by mouth daily before breakfast.   Nasal Dilators (NASAL STRIPS) STRP 1 strip by Does not apply route at bedtime. Breathing strips   polyethylene glycol powder (GLYCOLAX/MIRALAX) 17 GM/SCOOP powder Take 17 g by mouth daily.   Vitamin D, Ergocalciferol, (DRISDOL) 1.25 MG (50000 UNIT) CAPS capsule TAKE 1 CAPSULE (50,000 UNITS TOTAL) BY MOUTH EVERY 7 (SEVEN) DAYS   No facility-administered encounter medications on file as of 12/06/2022.    No Known Allergies  Review of Systems  Constitutional:  Negative for activity change, appetite change, chills, diaphoresis, fatigue, fever and unexpected weight change.  HENT: Negative.    Eyes: Negative.  Negative for photophobia and visual disturbance.  Respiratory:  Negative for cough, chest tightness and shortness of breath.   Cardiovascular:  Negative for chest pain, palpitations and leg swelling.  Gastrointestinal:  Negative for abdominal distention, abdominal pain, anal bleeding, blood in stool, constipation, diarrhea, nausea, rectal pain and vomiting.       Stool incontinence  Endocrine: Negative.  Negative for polydipsia, polyphagia and polyuria.  Genitourinary:  Negative for decreased urine volume, difficulty urinating, dysuria, frequency and urgency.  Musculoskeletal:  Negative for arthralgias and myalgias.  Skin:  Negative.   Allergic/Immunologic: Negative.   Neurological:  Negative for dizziness, weakness and headaches.  Hematological: Negative.   Psychiatric/Behavioral:  Positive for decreased concentration. Negative for agitation, behavioral problems, confusion, dysphoric mood, hallucinations, self-injury, sleep disturbance and suicidal ideas. The patient is hyperactive. The patient is not nervous/anxious.   All other systems reviewed and are negative.       Objective:  BP 130/89   Pulse 75   Temp 98.1 F (36.7 C) (Temporal)   Ht '5\' 4"'$  (1.626 m)   Wt 142 lb 9.6 oz (64.7 kg)   LMP 03/12/2018 Comment: going thru change  SpO2 97%   BMI 24.48 kg/m    Wt Readings from Last 3 Encounters:  12/06/22 142 lb 9.6 oz (64.7 kg)  10/25/22 145 lb 11.2 oz (66.1 kg)  06/03/22 135 lb (61.2 kg)    Physical Exam Vitals and nursing note reviewed.  Constitutional:      General: She is not in acute distress.    Appearance: Normal appearance. She is well-developed, well-groomed  and normal weight. She is not ill-appearing, toxic-appearing or diaphoretic.  HENT:     Head: Normocephalic and atraumatic.     Jaw: There is normal jaw occlusion.     Right Ear: Hearing normal.     Left Ear: Hearing normal.     Nose: Nose normal.     Mouth/Throat:     Lips: Pink.     Mouth: Mucous membranes are moist.     Pharynx: Oropharynx is clear. Uvula midline.  Eyes:     General: Lids are normal.     Extraocular Movements: Extraocular movements intact.     Conjunctiva/sclera: Conjunctivae normal.     Pupils: Pupils are equal, round, and reactive to light.  Neck:     Thyroid: No thyroid mass, thyromegaly or thyroid tenderness.     Vascular: No carotid bruit or JVD.     Trachea: Trachea and phonation normal.  Cardiovascular:     Rate and Rhythm: Normal rate and regular rhythm.     Chest Wall: PMI is not displaced.     Pulses: Normal pulses.     Heart sounds: Normal heart sounds. No murmur heard.    No friction  rub. No gallop.  Pulmonary:     Effort: Pulmonary effort is normal.     Breath sounds: Normal breath sounds.  Abdominal:     General: Bowel sounds are normal. There is no abdominal bruit.     Palpations: Abdomen is soft. There is no hepatomegaly or splenomegaly.  Musculoskeletal:        General: Normal range of motion.     Cervical back: Normal range of motion and neck supple.     Right lower leg: No edema.     Left lower leg: No edema.  Lymphadenopathy:     Cervical: No cervical adenopathy.  Skin:    General: Skin is warm and dry.     Capillary Refill: Capillary refill takes less than 2 seconds.     Coloration: Skin is not cyanotic, jaundiced or pale.     Findings: No rash.  Neurological:     General: No focal deficit present.     Mental Status: She is alert and oriented to person, place, and time.     Sensory: Sensation is intact.     Motor: Motor function is intact.     Coordination: Coordination is intact.     Gait: Gait is intact.     Deep Tendon Reflexes: Reflexes are normal and symmetric.  Psychiatric:        Attention and Perception: Attention and perception normal.        Mood and Affect: Mood and affect normal.        Speech: Speech normal.        Behavior: Behavior normal. Behavior is cooperative.        Thought Content: Thought content normal.        Cognition and Memory: Cognition and memory normal.        Judgment: Judgment normal.     Results for orders placed or performed in visit on 10/25/22  CMP ([facility] only)  Result Value Ref Range   Sodium 134 (L) 135 - 145 mmol/L   Potassium 4.5 3.5 - 5.1 mmol/L   Chloride 101 98 - 111 mmol/L   CO2 25 22 - 32 mmol/L   Glucose, Bld 105 (H) 70 - 99 mg/dL   BUN 9 6 - 20 mg/dL   Creatinine 0.77 0.44 - 1.00 mg/dL  Calcium 9.3 8.9 - 10.3 mg/dL   Total Protein 7.2 6.5 - 8.1 g/dL   Albumin 3.4 (L) 3.5 - 5.0 g/dL   AST 27 15 - 41 U/L   ALT 23 0 - 44 U/L   Alkaline Phosphatase 65 38 - 126 U/L   Total Bilirubin  0.6 0.3 - 1.2 mg/dL   GFR, Estimated >60 >60 mL/min   Anion gap 8 5 - 15  CBC with Differential ([facility] Only)  Result Value Ref Range   WBC Count 5.8 4.0 - 10.5 K/uL   RBC 4.16 3.87 - 5.11 MIL/uL   Hemoglobin 12.4 12.0 - 15.0 g/dL   HCT 36.8 36.0 - 46.0 %   MCV 88.5 80.0 - 100.0 fL   MCH 29.8 26.0 - 34.0 pg   MCHC 33.7 30.0 - 36.0 g/dL   RDW 12.6 11.5 - 15.5 %   Platelet Count 302 150 - 400 K/uL   nRBC 0.0 0.0 - 0.2 %   Neutrophils Relative % 57 %   Neutro Abs 3.3 1.7 - 7.7 K/uL   Lymphocytes Relative 29 %   Lymphs Abs 1.6 0.7 - 4.0 K/uL   Monocytes Relative 11 %   Monocytes Absolute 0.6 0.1 - 1.0 K/uL   Eosinophils Relative 3 %   Eosinophils Absolute 0.2 0.0 - 0.5 K/uL   Basophils Relative 0 %   Basophils Absolute 0.0 0.0 - 0.1 K/uL   Immature Granulocytes 0 %   Abs Immature Granulocytes 0.01 0.00 - 0.07 K/uL  Iron and Iron Binding Capacity ([facility] only)  Result Value Ref Range   Iron 51 28 - 170 ug/dL   TIBC 447 250 - 450 ug/dL   Saturation Ratios 11 10.4 - 31.8 %   UIBC 396 148 - 442 ug/dL  Ferritin  Result Value Ref Range   Ferritin 8 (L) 11 - 307 ng/mL     X-Ray: KUB: Significant stool burden. Preliminary x-ray reading by Monia Pouch, FNP-C, [facility].   Pertinent labs & imaging results that were available during my care of the patient were reviewed by me and considered in my medical decision making.  Assessment & Plan:  Etheline was seen today for medical management of chronic issues and encopresis.  Diagnoses and all orders for this visit:  Acquired hypothyroidism Thyroid disease has been well controlled. Labs are pending. Adjustments to regimen will be made if warranted. Make sure to take medications on an empty stomach with a full glass of water. Make sure to avoid vitamins or supplements for at least 4 hours before and 4 hours after taking medications. Repeat labs in 3 months if adjustments are made and in 6 months if stable.   -     TSH -     T4,  Free -     T3, Free  Vitamin D deficiency Labs pending. Continue repletion therapy. If indicated, will change repletion dosage. Eat foods rich in Vit D including milk, orange juice, yogurt with vitamin D added, salmon or mackerel, canned tuna fish, cereals with vitamin D added, and cod liver oil. Get out in the sun but make sure to wear at least SPF 30 sunscreen.  -     CMP14+EGFR -     VITAMIN D 25 Hydroxy (Vit-D Deficiency, Fractures)  Pure hypercholesterolemia Diet encouraged - increase intake of fresh fruits and vegetables, increase intake of lean proteins. Bake, broil, or grill foods. Avoid fried, greasy, and fatty foods. Avoid fast foods. Increase intake of fiber-rich whole grains.  Exercise encouraged - at least 150 minutes per week and advance as tolerated.  -     CMP14+EGFR -     Lipid panel  Lack of concentration Reports ongoing symptoms for years. Feels she has ADHD and is undiagnosed. Will check for potential underlying causes and treat if present. If no causes and symptoms persist, will refer to psych for evaluation and treatment.  -     Anemia Profile B -     CMP14+EGFR -     TSH -     T4, Free -     T3, Free  Encopresis Slow transit constipation KUB revealed significant stool burden. Could likely be cause of encopresis. Will treat with Miralax clean out and place referral to see GI.  -     Anemia Profile B -     CMP14+EGFR -     Ambulatory referral to Gastroenterology -     DG Abd 1 View -     polyethylene glycol powder (GLYCOLAX/MIRALAX) 17 GM/SCOOP powder; Take 17 g by mouth daily.     Continue all other maintenance medications.  Follow up plan: Return in about 6 months (around 06/06/2023), or if symptoms worsen or fail to improve, for thyroid.   Continue healthy lifestyle choices, including diet (rich in fruits, vegetables, and lean proteins, and low in salt and simple carbohydrates) and exercise (at least 30 minutes of moderate physical activity  daily).  Educational handout given for constipation   The above assessment and management plan was discussed with the patient. The patient verbalized understanding of and has agreed to the management plan. Patient is aware to call the clinic if they develop any new symptoms or if symptoms persist or worsen. Patient is aware when to return to the clinic for a follow-up visit. Patient educated on when it is appropriate to go to the emergency department.   Monia Pouch, FNP-C [facility] [phone]

## 2022-12-06 NOTE — Patient Instructions (Addendum)
[facility]   Thank you for coming in to clinic today.  1. Your symptoms are consistent with Constipation, likely cause of your General Abdominal Pain / Cramping. 2. Start with Miralax, prescription was sent to pharmacy. First dose 68g (4 capfuls) in 32oz water over 1 to 2 hours for clean out. Next day start 17g or 1 capful daily, may adjust dose up or down by half a capful every few days. Recommend to take this medicine daily for next 1-2 weeks, you may need to use it longer if needed. - Goal is to have soft regular bowel movement 1-3x daily, if too runny or diarrhea, then reduce dose of the medicine to every other day.  Improve water intake, hydration will help Also recommend increased vegetables, fruits, fiber intake Can try daily Metamucil or Fiber supplement at pharmacy over the counter  Follow-up if symptoms are not improving with bowel movements, or if pain worsens, develop fevers, nausea, vomiting.  Please schedule a follow-up appointment with Monia Pouch, FNP, in 1 month to follow-up Constipation  If you have any other questions or concerns, please feel free to call the clinic to contact me. You may also schedule an earlier appointment if necessary.  However, if your symptoms get significantly worse, please go to the Emergency Department to seek immediate medical attention.

## 2022-12-07 LAB — ANEMIA PROFILE B
Basophils Absolute: 0 10*3/uL (ref 0.0–0.2)
Basos: 0 %
EOS (ABSOLUTE): 0.3 10*3/uL (ref 0.0–0.4)
Eos: 5 %
Ferritin: 36 ng/mL (ref 15–150)
Folate: >20 ng/mL (ref >3.0)
Hematocrit: 40.3 % (ref 34.0–46.6)
Hemoglobin: 13.7 g/dL (ref 11.1–15.9)
Immature Grans (Abs): 0 10*3/uL (ref 0.0–0.1)
Immature Granulocytes: 0 %
Iron Saturation: 38 % (ref 15–55)
Iron: 118 ug/dL (ref 27–159)
Lymphocytes Absolute: 1.7 10*3/uL (ref 0.7–3.1)
Lymphs: 34 %
MCH: 30.2 pg (ref 26.6–33.0)
MCHC: 34 g/dL (ref 31.5–35.7)
MCV: 89 fL (ref 79–97)
Monocytes Absolute: 0.5 10*3/uL (ref 0.1–0.9)
Monocytes: 10 %
Neutrophils Absolute: 2.5 10*3/uL (ref 1.4–7.0)
Neutrophils: 51 %
Platelets: 281 10*3/uL (ref 150–450)
RBC: 4.54 x10E6/uL (ref 3.77–5.28)
RDW: 15.4 % (ref 11.7–15.4)
Retic Ct Pct: 1.5 % (ref 0.6–2.6)
Total Iron Binding Capacity: 310 ug/dL (ref 250–450)
UIBC: 192 ug/dL (ref 131–425)
Vitamin B-12: 554 pg/mL (ref 232–1245)
WBC: 5 10*3/uL (ref 3.4–10.8)

## 2022-12-07 LAB — LIPID PANEL
Chol/HDL Ratio: 2.4 ratio (ref 0.0–4.4)
Cholesterol, Total: 207 mg/dL — ABNORMAL HIGH (ref 100–199)
HDL: 87 mg/dL (ref >39)
LDL Chol Calc (NIH): 107 mg/dL — ABNORMAL HIGH (ref 0–99)
Triglycerides: 73 mg/dL (ref 0–149)
VLDL Cholesterol Cal: 13 mg/dL (ref 5–40)

## 2022-12-07 LAB — VITAMIN D 25 HYDROXY (VIT D DEFICIENCY, FRACTURES): Vit D, 25-Hydroxy: 64.8 ng/mL (ref 30.0–100.0)

## 2022-12-07 LAB — CMP14+EGFR
ALT: 24 IU/L (ref 0–32)
AST: 24 IU/L (ref 0–40)
Albumin/Globulin Ratio: 2.1 (ref 1.2–2.2)
Albumin: 4.2 g/dL (ref 3.8–4.9)
Alkaline Phosphatase: 67 IU/L (ref 44–121)
BUN/Creatinine Ratio: 11 — ABNORMAL LOW (ref 12–28)
BUN: 9 mg/dL (ref 8–27)
Bilirubin Total: 0.3 mg/dL (ref 0.0–1.2)
CO2: 23 mmol/L (ref 20–29)
Calcium: 9.7 mg/dL (ref 8.7–10.3)
Chloride: 97 mmol/L (ref 96–106)
Creatinine, Ser: 0.8 mg/dL (ref 0.57–1.00)
Globulin, Total: 2 g/dL (ref 1.5–4.5)
Glucose: 101 mg/dL — ABNORMAL HIGH (ref 70–99)
Potassium: 4.9 mmol/L (ref 3.5–5.2)
Sodium: 134 mmol/L (ref 134–144)
Total Protein: 6.2 g/dL (ref 6.0–8.5)
eGFR: 84 mL/min/{1.73_m2} (ref >59)

## 2022-12-07 LAB — T4, FREE: Free T4: 1.43 ng/dL (ref 0.82–1.77)

## 2022-12-07 LAB — T3, FREE: T3, Free: 3 pg/mL (ref 2.0–4.4)

## 2022-12-07 LAB — TSH: TSH: 2.16 u[IU]/mL (ref 0.450–4.500)

## 2022-12-16 ENCOUNTER — Encounter: Payer: Self-pay | Admitting: Family Medicine

## 2023-01-23 ENCOUNTER — Ambulatory Visit (HOSPITAL_COMMUNITY): Payer: BC Managed Care – PPO

## 2023-02-01 ENCOUNTER — Other Ambulatory Visit: Payer: Self-pay | Admitting: Family Medicine

## 2023-02-01 DIAGNOSIS — K219 Gastro-esophageal reflux disease without esophagitis: Secondary | ICD-10-CM

## 2023-02-01 DIAGNOSIS — M2550 Pain in unspecified joint: Secondary | ICD-10-CM

## 2023-02-01 DIAGNOSIS — G959 Disease of spinal cord, unspecified: Secondary | ICD-10-CM

## 2023-03-03 ENCOUNTER — Other Ambulatory Visit: Payer: Self-pay | Admitting: Family Medicine

## 2023-03-03 DIAGNOSIS — J309 Allergic rhinitis, unspecified: Secondary | ICD-10-CM

## 2023-04-07 ENCOUNTER — Encounter: Payer: Self-pay | Admitting: *Deleted

## 2023-04-21 ENCOUNTER — Other Ambulatory Visit: Payer: Self-pay | Admitting: Family Medicine

## 2023-04-21 DIAGNOSIS — E039 Hypothyroidism, unspecified: Secondary | ICD-10-CM

## 2023-04-25 ENCOUNTER — Other Ambulatory Visit: Payer: Self-pay

## 2023-04-25 DIAGNOSIS — Z170 Estrogen receptor positive status [ER+]: Secondary | ICD-10-CM

## 2023-04-26 ENCOUNTER — Inpatient Hospital Stay: Payer: BC Managed Care – PPO | Attending: Nurse Practitioner

## 2023-04-26 ENCOUNTER — Other Ambulatory Visit: Payer: Self-pay

## 2023-04-26 DIAGNOSIS — D509 Iron deficiency anemia, unspecified: Secondary | ICD-10-CM | POA: Diagnosis present

## 2023-04-26 DIAGNOSIS — Z923 Personal history of irradiation: Secondary | ICD-10-CM | POA: Diagnosis not present

## 2023-04-26 DIAGNOSIS — Z853 Personal history of malignant neoplasm of breast: Secondary | ICD-10-CM | POA: Insufficient documentation

## 2023-04-26 DIAGNOSIS — C50212 Malignant neoplasm of upper-inner quadrant of left female breast: Secondary | ICD-10-CM

## 2023-04-26 LAB — CMP (CANCER CENTER ONLY)
ALT: 22 U/L (ref 0–44)
AST: 20 U/L (ref 15–41)
Albumin: 3.9 g/dL (ref 3.5–5.0)
Alkaline Phosphatase: 62 U/L (ref 38–126)
Anion gap: 6 (ref 5–15)
BUN: 9 mg/dL (ref 6–20)
CO2: 26 mmol/L (ref 22–32)
Calcium: 9.5 mg/dL (ref 8.9–10.3)
Chloride: 106 mmol/L (ref 98–111)
Creatinine: 0.76 mg/dL (ref 0.44–1.00)
GFR, Estimated: >60 mL/min (ref >60)
Glucose, Bld: 111 mg/dL — ABNORMAL HIGH (ref 70–99)
Potassium: 4.2 mmol/L (ref 3.5–5.1)
Sodium: 138 mmol/L (ref 135–145)
Total Bilirubin: 0.4 mg/dL (ref 0.3–1.2)
Total Protein: 6.3 g/dL — ABNORMAL LOW (ref 6.5–8.1)

## 2023-04-26 LAB — CBC WITH DIFFERENTIAL (CANCER CENTER ONLY)
Abs Immature Granulocytes: 0.02 10*3/uL (ref 0.00–0.07)
Basophils Absolute: 0 10*3/uL (ref 0.0–0.1)
Basophils Relative: 1 %
Eosinophils Absolute: 0.2 10*3/uL (ref 0.0–0.5)
Eosinophils Relative: 3 %
HCT: 41.4 % (ref 36.0–46.0)
Hemoglobin: 14.3 g/dL (ref 12.0–15.0)
Immature Granulocytes: 0 %
Lymphocytes Relative: 24 %
Lymphs Abs: 1.6 10*3/uL (ref 0.7–4.0)
MCH: 32.5 pg (ref 26.0–34.0)
MCHC: 34.5 g/dL (ref 30.0–36.0)
MCV: 94.1 fL (ref 80.0–100.0)
Monocytes Absolute: 0.5 10*3/uL (ref 0.1–1.0)
Monocytes Relative: 8 %
Neutro Abs: 4.2 10*3/uL (ref 1.7–7.7)
Neutrophils Relative %: 64 %
Platelet Count: 260 10*3/uL (ref 150–400)
RBC: 4.4 MIL/uL (ref 3.87–5.11)
RDW: 12.1 % (ref 11.5–15.5)
WBC Count: 6.6 10*3/uL (ref 4.0–10.5)
nRBC: 0 % (ref 0.0–0.2)

## 2023-04-26 LAB — IRON AND IRON BINDING CAPACITY (CC-WL,HP ONLY)
Iron: 92 ug/dL (ref 28–170)
Saturation Ratios: 26 % (ref 10.4–31.8)
TIBC: 350 ug/dL (ref 250–450)
UIBC: 258 ug/dL (ref 148–442)

## 2023-04-26 LAB — FERRITIN: Ferritin: 43 ng/mL (ref 11–307)

## 2023-05-14 ENCOUNTER — Other Ambulatory Visit: Payer: Self-pay | Admitting: Family Medicine

## 2023-05-14 DIAGNOSIS — E559 Vitamin D deficiency, unspecified: Secondary | ICD-10-CM

## 2023-06-06 ENCOUNTER — Ambulatory Visit: Payer: BC Managed Care – PPO | Admitting: Family Medicine

## 2023-06-07 ENCOUNTER — Other Ambulatory Visit: Payer: Self-pay | Admitting: Family Medicine

## 2023-06-07 ENCOUNTER — Ambulatory Visit: Payer: BC Managed Care – PPO | Admitting: Family Medicine

## 2023-06-07 ENCOUNTER — Encounter: Payer: Self-pay | Admitting: Family Medicine

## 2023-06-07 VITALS — BP 127/83 | HR 95 | Temp 98.3°F | Ht 64.0 in | Wt 140.0 lb

## 2023-06-07 DIAGNOSIS — E559 Vitamin D deficiency, unspecified: Secondary | ICD-10-CM

## 2023-06-07 DIAGNOSIS — M2550 Pain in unspecified joint: Secondary | ICD-10-CM

## 2023-06-07 DIAGNOSIS — M5412 Radiculopathy, cervical region: Secondary | ICD-10-CM

## 2023-06-07 DIAGNOSIS — G959 Disease of spinal cord, unspecified: Secondary | ICD-10-CM

## 2023-06-07 DIAGNOSIS — F411 Generalized anxiety disorder: Secondary | ICD-10-CM

## 2023-06-07 DIAGNOSIS — J309 Allergic rhinitis, unspecified: Secondary | ICD-10-CM | POA: Diagnosis not present

## 2023-06-07 DIAGNOSIS — E039 Hypothyroidism, unspecified: Secondary | ICD-10-CM

## 2023-06-07 DIAGNOSIS — E7800 Pure hypercholesterolemia, unspecified: Secondary | ICD-10-CM

## 2023-06-07 MED ORDER — LEVOCETIRIZINE DIHYDROCHLORIDE 5 MG PO TABS
5.0000 mg | ORAL_TABLET | Freq: Every evening | ORAL | 1 refills | Status: AC
Start: 2023-06-07 — End: ?

## 2023-06-07 MED ORDER — CYCLOBENZAPRINE HCL 5 MG PO TABS: 5.0000 mg | ORAL_TABLET | Freq: Three times a day (TID) | ORAL | 6 refills | Status: AC | PRN

## 2023-06-07 NOTE — Progress Notes (Signed)
Subjective:  Patient ID: Dawn Rogers, female    DOB: 05/11/1961, 62 y.o.   MRN: [id number]  Patient Care Team: Sonny Masters, FNP as PCP - General (Family Medicine) Claud Kelp, MD as Consulting Physician (General Surgery) Malachy Mood, MD as Consulting Physician (Hematology) Antony Blackbird, MD as Consulting Physician (Radiation Oncology) Pollyann Samples, NP as Nurse Practitioner (Nurse Practitioner)   Chief Complaint:  Hypothyroidism (6 month follow up )   HPI: Dawn Rogers is a 62 y.o. female presenting on 06/07/2023 for Hypothyroidism (6 month follow up )   1. Multiple joint pain 2. Cervical myelopathy with cervical radiculopathy (HCC) Ongoing for several years. Does have increased symptoms at times. Will use topical Voltaren or oral steroids when pain is significant. She was on Cymbalta which she did not feel was beneficial, so she stopped this medication. Pain has not worsened, but feels her mood has changed.   3. Chronic allergic rhinitis Has been using Flonase and xyzal with great relief of symptoms. States she does need a refill today.   4. Acquired hypothyroidism Compliant with medications - Yes Current medications - levothyroxine 25 mcg Adverse side effects - none Weight - stable  Bowel habit changes - No Heat or cold intolerance - No Mood changes - Yes Changes in sleep habits - No Fatigue - No Skin, hair, or nail changes - No Tremor - No Palpitations - No Edema - No Shortness of breath - No  5. Vitamin D deficiency Pt is taking oral repletion therapy. Denies bone pain and tenderness, muscle weakness, fracture, and difficulty walking. Lab Results  Component Value Date   VD25OH 64.8 12/06/2022   VD25OH 85.9 06/03/2022   VD25OH 70.6 09/16/2021   Lab Results  Component Value Date   CALCIUM 9.5 04/26/2023     6. Pure hypercholesterolemia She is following a healthier diet. Not on statin therapy.   7. GAD (generalized anxiety disorder) Has  stopped her Cymbalta as she did not feel it was beneficial. States she has noticed an increase in being emotional over the last several weeks. Does not wish to restart anything at this time but would like to consider in the future if this does not improve.      06/07/2023   10:36 AM 12/06/2022   10:16 AM 06/03/2022    9:47 AM 12/17/2021   11:11 AM  GAD 7 : Generalized Anxiety Score  Nervous, Anxious, on Edge 1 0 1 1  Control/stop worrying 1 0 1 1  Worry too much - different things 1 0 0 1  Trouble relaxing 0 0 0 0  Restless 0 0 0 0  Easily annoyed or irritable 0 0 0 0  Afraid - awful might happen 1 0 1 0  Total GAD 7 Score 4 0 3 3  Anxiety Difficulty Not difficult at all Not difficult at all Not difficult at all Not difficult at all       06/07/2023   10:36 AM 12/06/2022   10:16 AM 06/03/2022    9:48 AM 06/03/2022    9:47 AM 09/16/2021   11:55 AM  Depression screen PHQ 2/9  Decreased Interest 0 0  0 0  Down, Depressed, Hopeless 0 0  0 0  PHQ - 2 Score 0 0  0 0  Altered sleeping 0 0 1  0  Tired, decreased energy 0 0 1  2  Change in appetite 0 3 1  0  Feeling bad or failure about  yourself  0 0 0  0  Trouble concentrating 2 0 2  1  Moving slowly or fidgety/restless 2 0 0  0  Suicidal thoughts 0 0 0  0  PHQ-9 Score 4 3   3   Difficult doing work/chores Not difficult at all Not difficult at all Not difficult at all  Not difficult at all        Relevant past medical, surgical, family, and social history reviewed and updated as indicated.  Allergies and medications reviewed and updated. Data reviewed: Chart in Epic.   Past Medical History:  Diagnosis Date   Anxiety    Arthritis    Cancer (HCC) 08/02/2018   left breast cancer   Family history of breast cancer    Family history of esophageal cancer    Family history of lymphoma    Family history of ovarian cancer    GERD (gastroesophageal reflux disease)    Hypothyroidism    Neck pain    Personal history of radiation  therapy     Past Surgical History:  Procedure Laterality Date   ANKLE FRACTURE SURGERY Left    BREAST LUMPECTOMY Left 08/2018   BREAST LUMPECTOMY WITH RADIOACTIVE SEED AND SENTINEL LYMPH NODE BIOPSY Left 08/31/2018   Procedure: LEFT DOUBLE BRACKETED LUMPECTOMY WITH RADIOACTIVE SEED X'S 2, LEFT SENTINEL LYMPH NODE BIOPSY, INJECT BLUE DYE LEFT BREAST;  Surgeon: Claud Kelp, MD;  Location: [facility];  Service: General;  Laterality: Left;   CHOLECYSTECTOMY     TUBAL LIGATION      Social History   Socioeconomic History   Marital status: Married    Spouse name: Not on file   Number of children: Not on file   Years of education: Not on file   Highest education level: Associate degree: occupational, Scientist, product/process development, or vocational program  Occupational History   Not on file  Tobacco Use   Smoking status: Never   Smokeless tobacco: Never  Substance and Sexual Activity   Alcohol use: Never   Drug use: Never   Sexual activity: Not on file  Other Topics Concern   Not on file  Social History Narrative   Not on file   Social Determinants of Health   Financial Resource Strain: Low Risk  (06/07/2023)   Overall Financial Resource Strain (CARDIA)    Difficulty of Paying Living Expenses: Not hard at all  Food Insecurity: No Food Insecurity (06/07/2023)   Hunger Vital Sign    Worried About Running Out of Food in the Last Year: Never true    Ran Out of Food in the Last Year: Never true  Transportation Needs: No Transportation Needs (06/07/2023)   PRAPARE - Administrator, Civil Service (Medical): No    Lack of Transportation (Non-Medical): No  Physical Activity: Insufficiently Active (06/07/2023)   Exercise Vital Sign    Days of Exercise per Week: 2 days    Minutes of Exercise per Session: 30 min  Stress: Stress Concern Present (06/07/2023)   [employer] of Occupational Health - Occupational Stress Questionnaire    Feeling of Stress : To some extent  Social  Connections: Socially Integrated (06/07/2023)   Social Connection and Isolation Panel [NHANES]    Frequency of Communication with Friends and Family: More than three times a week    Frequency of Social Gatherings with Friends and Family: Once a week    Attends Religious Services: More than 4 times per year    Active Member of [employer] or Organizations:  Yes    Attends Club or Organization Meetings: More than 4 times per year    Marital Status: Married  Intimate Partner Violence: Unknown (02/09/2022)   [facility] from [facility]   HITS    Physically Hurt: Not on file    Insult or Talk Down To: Not on file    Threaten Physical Harm: Not on file    Scream or Curse: Not on file    Outpatient Encounter Medications as of 06/07/2023  Medication Sig   esomeprazole (NEXIUM) 40 MG capsule TAKE 1 CAPSULE (40 MG TOTAL) BY MOUTH DAILY.   fluticasone (FLONASE) 50 MCG/ACT nasal spray SPRAY 2 SPRAYS INTO EACH NOSTRIL EVERY DAY   levothyroxine (SYNTHROID) 25 MCG tablet TAKE 1 TABLET DAILY BEFORE BREAKFAST FOR HYPOTHYROIDISM   Nasal Dilators (NASAL STRIPS) STRP 1 strip by Does not apply route at bedtime. Breathing strips   polyethylene glycol powder (GLYCOLAX/MIRALAX) 17 GM/SCOOP powder Take 17 g by mouth daily.   Vitamin D, Ergocalciferol, (DRISDOL) 1.25 MG (50000 UNIT) CAPS capsule TAKE 1 CAPSULE (50,000 UNITS TOTAL) BY MOUTH EVERY 7 (SEVEN) DAYS   [DISCONTINUED] cyclobenzaprine (FLEXERIL) 5 MG tablet Take 5 mg by mouth 3 (three) times daily as needed for muscle spasms.   [DISCONTINUED] levocetirizine (XYZAL) 5 MG tablet Take 1 tablet (5 mg total) by mouth every evening.   cyclobenzaprine (FLEXERIL) 5 MG tablet Take 1 tablet (5 mg total) by mouth 3 (three) times daily as needed for muscle spasms.   levocetirizine (XYZAL) 5 MG tablet Take 1 tablet (5 mg total) by mouth every evening.   [DISCONTINUED] DULoxetine (CYMBALTA) 60 MG capsule TAKE 1 CAPSULE BY MOUTH EVERY DAY (Patient not taking:  Reported on 06/07/2023)   No facility-administered encounter medications on file as of 06/07/2023.    No Known Allergies  Review of Systems  Constitutional:  Negative for activity change, appetite change, chills, diaphoresis, fatigue, fever and unexpected weight change.  HENT: Negative.    Eyes: Negative.  Negative for photophobia and visual disturbance.  Respiratory:  Negative for cough, chest tightness and shortness of breath.   Cardiovascular:  Negative for chest pain, palpitations and leg swelling.  Gastrointestinal:  Negative for abdominal pain, blood in stool, constipation, diarrhea, nausea and vomiting.  Endocrine: Negative.  Negative for cold intolerance, heat intolerance, polydipsia, polyphagia and polyuria.  Genitourinary:  Negative for decreased urine volume, difficulty urinating, dysuria, frequency and urgency.  Musculoskeletal:  Negative for arthralgias and myalgias.  Skin: Negative.   Allergic/Immunologic: Negative.   Neurological:  Negative for dizziness, tremors, seizures, syncope, facial asymmetry, speech difficulty, weakness, light-headedness, numbness and headaches.  Hematological: Negative.   Psychiatric/Behavioral:  Positive for decreased concentration. Negative for agitation, behavioral problems, confusion, dysphoric mood, hallucinations, self-injury, sleep disturbance and suicidal ideas. The patient is nervous/anxious. The patient is not hyperactive.   All other systems reviewed and are negative.       Objective:  BP 127/83   Pulse 95   Temp 98.3 F (36.8 C) (Temporal)   Ht 5\' 4"  (1.626 m)   Wt 140 lb (63.5 kg)   LMP 03/12/2018 Comment: going thru change  SpO2 99%   BMI 24.03 kg/m    Wt Readings from Last 3 Encounters:  06/07/23 140 lb (63.5 kg)  12/06/22 142 lb 9.6 oz (64.7 kg)  10/25/22 145 lb 11.2 oz (66.1 kg)    Physical Exam Vitals and nursing note reviewed.  Constitutional:      General: She is not in acute distress.  Appearance: Normal  appearance. She is well-developed and well-groomed. She is not ill-appearing, toxic-appearing or diaphoretic.  HENT:     Head: Normocephalic and atraumatic.     Jaw: There is normal jaw occlusion.     Right Ear: Hearing normal.     Left Ear: Hearing normal.     Nose: Nose normal.     Mouth/Throat:     Lips: Pink.     Mouth: Mucous membranes are moist.     Pharynx: Oropharynx is clear. Uvula midline.  Eyes:     General: Lids are normal.     Extraocular Movements: Extraocular movements intact.     Conjunctiva/sclera: Conjunctivae normal.     Pupils: Pupils are equal, round, and reactive to light.  Neck:     Thyroid: No thyroid mass, thyromegaly or thyroid tenderness.     Vascular: No carotid bruit or JVD.     Trachea: Trachea and phonation normal.  Cardiovascular:     Rate and Rhythm: Normal rate and regular rhythm.     Chest Wall: PMI is not displaced.     Pulses: Normal pulses.     Heart sounds: Normal heart sounds. No murmur heard.    No friction rub. No gallop.  Pulmonary:     Effort: Pulmonary effort is normal. No respiratory distress.     Breath sounds: Normal breath sounds. No wheezing.  Abdominal:     General: Bowel sounds are normal. There is no distension or abdominal bruit.     Palpations: Abdomen is soft. There is no hepatomegaly or splenomegaly.     Tenderness: There is no abdominal tenderness. There is no right CVA tenderness or left CVA tenderness.     Hernia: No hernia is present.  Musculoskeletal:        General: Normal range of motion.     Cervical back: Normal range of motion and neck supple.     Right lower leg: No edema.     Left lower leg: No edema.  Lymphadenopathy:     Cervical: No cervical adenopathy.  Skin:    General: Skin is warm and dry.     Capillary Refill: Capillary refill takes less than 2 seconds.     Coloration: Skin is not cyanotic, jaundiced or pale.     Findings: No rash.  Neurological:     General: No focal deficit present.      Mental Status: She is alert and oriented to person, place, and time.     Sensory: Sensation is intact.     Motor: Motor function is intact.     Coordination: Coordination is intact.     Gait: Gait is intact.     Deep Tendon Reflexes: Reflexes are normal and symmetric.  Psychiatric:        Attention and Perception: Attention and perception normal.        Mood and Affect: Mood and affect normal.        Speech: Speech normal.        Behavior: Behavior normal. Behavior is cooperative.        Thought Content: Thought content normal.        Cognition and Memory: Cognition and memory normal.        Judgment: Judgment normal.     Results for orders placed or performed in visit on 04/26/23  Iron and Iron Binding Capacity ([facility] only)  Result Value Ref Range   Iron 92 28 - 170 ug/dL   TIBC 147 829 - 562  ug/dL   Saturation Ratios 26 10.4 - 31.8 %   UIBC 258 148 - 442 ug/dL  Ferritin  Result Value Ref Range   Ferritin 43 11 - 307 ng/mL  CMP (Cancer Center only)  Result Value Ref Range   Sodium 138 135 - 145 mmol/L   Potassium 4.2 3.5 - 5.1 mmol/L   Chloride 106 98 - 111 mmol/L   CO2 26 22 - 32 mmol/L   Glucose, Bld 111 (H) 70 - 99 mg/dL   BUN 9 6 - 20 mg/dL   Creatinine 8.29 5.62 - 1.00 mg/dL   Calcium 9.5 8.9 - 13.0 mg/dL   Total Protein 6.3 (L) 6.5 - 8.1 g/dL   Albumin 3.9 3.5 - 5.0 g/dL   AST 20 15 - 41 U/L   ALT 22 0 - 44 U/L   Alkaline Phosphatase 62 38 - 126 U/L   Total Bilirubin 0.4 0.3 - 1.2 mg/dL   GFR, Estimated >86 >57 mL/min   Anion gap 6 5 - 15  CBC with Differential (Cancer Center Only)  Result Value Ref Range   WBC Count 6.6 4.0 - 10.5 K/uL   RBC 4.40 3.87 - 5.11 MIL/uL   Hemoglobin 14.3 12.0 - 15.0 g/dL   HCT 84.6 96.2 - 95.2 %   MCV 94.1 80.0 - 100.0 fL   MCH 32.5 26.0 - 34.0 pg   MCHC 34.5 30.0 - 36.0 g/dL   RDW 84.1 32.4 - 40.1 %   Platelet Count 260 150 - 400 K/uL   nRBC 0.0 0.0 - 0.2 %   Neutrophils Relative % 64 %   Neutro Abs 4.2 1.7 - 7.7  K/uL   Lymphocytes Relative 24 %   Lymphs Abs 1.6 0.7 - 4.0 K/uL   Monocytes Relative 8 %   Monocytes Absolute 0.5 0.1 - 1.0 K/uL   Eosinophils Relative 3 %   Eosinophils Absolute 0.2 0.0 - 0.5 K/uL   Basophils Relative 1 %   Basophils Absolute 0.0 0.0 - 0.1 K/uL   Immature Granulocytes 0 %   Abs Immature Granulocytes 0.02 0.00 - 0.07 K/uL       Pertinent labs & imaging results that were available during my care of the patient were reviewed by me and considered in my medical decision making.  Assessment & Plan:  Moesha was seen today for hypothyroidism.  Diagnoses and all orders for this visit:  Acquired hypothyroidism Thyroid disease has been well controlled. Labs are pending. Adjustments to regimen will be made if warranted. Make sure to take medications on an empty stomach with a full glass of water. Make sure to avoid vitamins or supplements for at least 4 hours before and 4 hours after taking medications. Repeat labs in 3 months if adjustments are made and in 6 months if stable.   -     Thyroid Panel With TSH  Multiple joint pain Cervical myelopathy with cervical radiculopathy (HCC) Does well with as needed medications, will continue.  -     cyclobenzaprine (FLEXERIL) 5 MG tablet; Take 1 tablet (5 mg total) by mouth 3 (three) times daily as needed for muscle spasms.  Chronic allergic rhinitis Doing well with below, will continue.  -     levocetirizine (XYZAL) 5 MG tablet; Take 1 tablet (5 mg total) by mouth every evening.  Vitamin D deficiency Labs pending. Continue repletion therapy. If indicated, will change repletion dosage. Eat foods rich in Vit D including milk, orange juice, yogurt with vitamin D added,  salmon or mackerel, canned tuna fish, cereals with vitamin D added, and cod liver oil. Get out in the sun but make sure to wear at least SPF 30 sunscreen.  -     CBC with Differential/Platelet -     VITAMIN D 25 Hydroxy (Vit-D Deficiency, Fractures)  Pure  hypercholesterolemia Labs pending. Diet and exercise encouraged.  -     CMP14+EGFR -     Lipid panel  GAD (generalized anxiety disorder) Has stopped medications. Will notify provider if she feels she needs to restart. Will check below labs.  -     CBC with Differential/Platelet -     Thyroid Panel With TSH     Continue all other maintenance medications.  Follow up plan: Return in 6 months (on 12/08/2023), or if symptoms worsen or fail to improve, for chronic follow up.   Continue healthy lifestyle choices, including diet (rich in fruits, vegetables, and lean proteins, and low in salt and simple carbohydrates) and exercise (at least 30 minutes of moderate physical activity daily).    The above assessment and management plan was discussed with the patient. The patient verbalized understanding of and has agreed to the management plan. Patient is aware to call the clinic if they develop any new symptoms or if symptoms persist or worsen. Patient is aware when to return to the clinic for a follow-up visit. Patient educated on when it is appropriate to go to the emergency department.   Kari Baars, FNP-C [facility] [phone]

## 2023-06-08 LAB — CBC WITH DIFFERENTIAL/PLATELET
Basophils Absolute: 0 10*3/uL (ref 0.0–0.2)
Basos: 1 %
EOS (ABSOLUTE): 0.2 10*3/uL (ref 0.0–0.4)
Eos: 3 %
Hematocrit: 43.6 % (ref 34.0–46.6)
Hemoglobin: 14.7 g/dL (ref 11.1–15.9)
Immature Grans (Abs): 0 10*3/uL (ref 0.0–0.1)
Immature Granulocytes: 0 %
Lymphocytes Absolute: 1.5 10*3/uL (ref 0.7–3.1)
Lymphs: 28 %
MCH: 32.9 pg (ref 26.6–33.0)
MCHC: 33.7 g/dL (ref 31.5–35.7)
MCV: 98 fL — ABNORMAL HIGH (ref 79–97)
Monocytes Absolute: 0.5 10*3/uL (ref 0.1–0.9)
Monocytes: 9 %
Neutrophils Absolute: 3.3 10*3/uL (ref 1.4–7.0)
Neutrophils: 59 %
Platelets: 280 10*3/uL (ref 150–450)
RBC: 4.47 x10E6/uL (ref 3.77–5.28)
RDW: 11.9 % (ref 11.7–15.4)
WBC: 5.5 10*3/uL (ref 3.4–10.8)

## 2023-06-08 LAB — CMP14+EGFR
ALT: 18 IU/L (ref 0–32)
AST: 18 IU/L (ref 0–40)
Albumin: 4.4 g/dL (ref 3.8–4.9)
Alkaline Phosphatase: 70 IU/L (ref 44–121)
BUN/Creatinine Ratio: 14 (ref 12–28)
BUN: 10 mg/dL (ref 8–27)
Bilirubin Total: 0.5 mg/dL (ref 0.0–1.2)
CO2: 21 mmol/L (ref 20–29)
Calcium: 10 mg/dL (ref 8.7–10.3)
Chloride: 100 mmol/L (ref 96–106)
Creatinine, Ser: 0.71 mg/dL (ref 0.57–1.00)
Globulin, Total: 2.1 g/dL (ref 1.5–4.5)
Glucose: 104 mg/dL — ABNORMAL HIGH (ref 70–99)
Potassium: 5.4 mmol/L — ABNORMAL HIGH (ref 3.5–5.2)
Sodium: 135 mmol/L (ref 134–144)
Total Protein: 6.5 g/dL (ref 6.0–8.5)
eGFR: 97 mL/min/{1.73_m2} (ref >59)

## 2023-06-08 LAB — LIPID PANEL
Chol/HDL Ratio: 2.9 ratio (ref 0.0–4.4)
Cholesterol, Total: 239 mg/dL — ABNORMAL HIGH (ref 100–199)
HDL: 83 mg/dL (ref >39)
LDL Chol Calc (NIH): 133 mg/dL — ABNORMAL HIGH (ref 0–99)
Triglycerides: 132 mg/dL (ref 0–149)
VLDL Cholesterol Cal: 23 mg/dL (ref 5–40)

## 2023-06-08 LAB — THYROID PANEL WITH TSH
Free Thyroxine Index: 2.9 (ref 1.2–4.9)
T3 Uptake Ratio: 25 % (ref 24–39)
T4, Total: 11.5 ug/dL (ref 4.5–12.0)
TSH: 2.03 u[IU]/mL (ref 0.450–4.500)

## 2023-06-08 LAB — VITAMIN D 25 HYDROXY (VIT D DEFICIENCY, FRACTURES): Vit D, 25-Hydroxy: 76.5 ng/mL (ref 30.0–100.0)

## 2023-06-11 ENCOUNTER — Other Ambulatory Visit: Payer: Self-pay | Admitting: Family Medicine

## 2023-06-11 DIAGNOSIS — E559 Vitamin D deficiency, unspecified: Secondary | ICD-10-CM

## 2023-06-13 ENCOUNTER — Ambulatory Visit: Payer: BC Managed Care – PPO | Admitting: Family Medicine

## 2023-06-14 ENCOUNTER — Other Ambulatory Visit: Payer: Self-pay | Admitting: Family Medicine

## 2023-06-14 DIAGNOSIS — E559 Vitamin D deficiency, unspecified: Secondary | ICD-10-CM

## 2023-06-21 ENCOUNTER — Other Ambulatory Visit: Payer: Self-pay | Admitting: Chiropractic Medicine

## 2023-06-21 ENCOUNTER — Ambulatory Visit
Admission: RE | Admit: 2023-06-21 | Discharge: 2023-06-21 | Disposition: A | Discharge status: Home / Self Care | Payer: BC Managed Care – PPO | Source: Ambulatory Visit | Attending: Chiropractic Medicine | Admitting: Chiropractic Medicine

## 2023-06-21 DIAGNOSIS — M549 Dorsalgia, unspecified: Secondary | ICD-10-CM

## 2023-06-21 DIAGNOSIS — M542 Cervicalgia: Secondary | ICD-10-CM

## 2023-06-23 ENCOUNTER — Encounter: Payer: Self-pay | Admitting: Family Medicine

## 2023-06-23 ENCOUNTER — Other Ambulatory Visit: Payer: Self-pay | Admitting: Family Medicine

## 2023-06-23 DIAGNOSIS — F411 Generalized anxiety disorder: Secondary | ICD-10-CM

## 2023-06-23 MED ORDER — FLUOXETINE HCL 10 MG PO CAPS: 10.0000 mg | ORAL_CAPSULE | Freq: Every day | ORAL | 3 refills | Status: DC

## 2023-07-30 ENCOUNTER — Encounter: Payer: Self-pay | Admitting: Family Medicine

## 2023-08-01 ENCOUNTER — Ambulatory Visit
Admission: RE | Admit: 2023-08-01 | Discharge: 2023-08-01 | Disposition: A | Discharge status: Home / Self Care | Payer: BC Managed Care – PPO | Source: Ambulatory Visit | Attending: Nurse Practitioner | Admitting: Nurse Practitioner

## 2023-08-01 DIAGNOSIS — Z170 Estrogen receptor positive status [ER+]: Secondary | ICD-10-CM

## 2023-08-10 ENCOUNTER — Other Ambulatory Visit: Payer: Self-pay | Admitting: Family Medicine

## 2023-08-10 DIAGNOSIS — K219 Gastro-esophageal reflux disease without esophagitis: Secondary | ICD-10-CM

## 2023-09-29 ENCOUNTER — Other Ambulatory Visit: Payer: Self-pay | Admitting: Family Medicine

## 2023-09-29 DIAGNOSIS — E559 Vitamin D deficiency, unspecified: Secondary | ICD-10-CM

## 2023-10-28 ENCOUNTER — Telehealth: Payer: Self-pay | Admitting: Hematology

## 2023-10-30 ENCOUNTER — Inpatient Hospital Stay: Payer: Self-pay | Admitting: Nurse Practitioner

## 2023-10-30 ENCOUNTER — Inpatient Hospital Stay: Payer: Self-pay

## 2023-11-10 ENCOUNTER — Inpatient Hospital Stay: Payer: 59 | Admitting: Hematology

## 2023-11-10 ENCOUNTER — Encounter: Payer: Self-pay | Admitting: Hematology

## 2023-11-10 ENCOUNTER — Inpatient Hospital Stay: Payer: 59 | Attending: Hematology

## 2023-11-10 VITALS — BP 134/77 | HR 81 | Temp 98.3°F | Resp 18 | Wt 150.0 lb

## 2023-11-10 DIAGNOSIS — E611 Iron deficiency: Secondary | ICD-10-CM | POA: Diagnosis not present

## 2023-11-10 DIAGNOSIS — Z170 Estrogen receptor positive status [ER+]: Secondary | ICD-10-CM

## 2023-11-10 DIAGNOSIS — Z853 Personal history of malignant neoplasm of breast: Secondary | ICD-10-CM | POA: Insufficient documentation

## 2023-11-10 DIAGNOSIS — Z923 Personal history of irradiation: Secondary | ICD-10-CM | POA: Insufficient documentation

## 2023-11-10 DIAGNOSIS — C50212 Malignant neoplasm of upper-inner quadrant of left female breast: Secondary | ICD-10-CM | POA: Diagnosis not present

## 2023-11-10 DIAGNOSIS — K5909 Other constipation: Secondary | ICD-10-CM | POA: Insufficient documentation

## 2023-11-10 LAB — CBC WITH DIFFERENTIAL (CANCER CENTER ONLY)
Abs Immature Granulocytes: 0.01 10*3/uL (ref 0.00–0.07)
Basophils Absolute: 0 10*3/uL (ref 0.0–0.1)
Basophils Relative: 1 %
Eosinophils Absolute: 0.1 10*3/uL (ref 0.0–0.5)
Eosinophils Relative: 3 %
HCT: 35.5 % — ABNORMAL LOW (ref 36.0–46.0)
Hemoglobin: 12.5 g/dL (ref 12.0–15.0)
Immature Granulocytes: 0 %
Lymphocytes Relative: 27 %
Lymphs Abs: 1 10*3/uL (ref 0.7–4.0)
MCH: 33.2 pg (ref 26.0–34.0)
MCHC: 35.2 g/dL (ref 30.0–36.0)
MCV: 94.2 fL (ref 80.0–100.0)
Monocytes Absolute: 0.4 10*3/uL (ref 0.1–1.0)
Monocytes Relative: 11 %
Neutro Abs: 2.1 10*3/uL (ref 1.7–7.7)
Neutrophils Relative %: 58 %
Platelet Count: 266 10*3/uL (ref 150–400)
RBC: 3.77 MIL/uL — ABNORMAL LOW (ref 3.87–5.11)
RDW: 12.5 % (ref 11.5–15.5)
WBC Count: 3.6 10*3/uL — ABNORMAL LOW (ref 4.0–10.5)
nRBC: 0 % (ref 0.0–0.2)

## 2023-11-10 LAB — CMP (CANCER CENTER ONLY)
ALT: 20 U/L (ref 0–44)
AST: 20 U/L (ref 15–41)
Albumin: 3.9 g/dL (ref 3.5–5.0)
Alkaline Phosphatase: 53 U/L (ref 38–126)
Anion gap: 5 (ref 5–15)
BUN: 6 mg/dL — ABNORMAL LOW (ref 8–23)
CO2: 28 mmol/L (ref 22–32)
Calcium: 9.1 mg/dL (ref 8.9–10.3)
Chloride: 104 mmol/L (ref 98–111)
Creatinine: 0.75 mg/dL (ref 0.44–1.00)
GFR, Estimated: >60 mL/min (ref >60)
Glucose, Bld: 86 mg/dL (ref 70–99)
Potassium: 3.6 mmol/L (ref 3.5–5.1)
Sodium: 137 mmol/L (ref 135–145)
Total Bilirubin: 0.3 mg/dL (ref 0.0–1.2)
Total Protein: 6.4 g/dL — ABNORMAL LOW (ref 6.5–8.1)

## 2023-11-10 LAB — IRON AND IRON BINDING CAPACITY (CC-WL,HP ONLY)
Iron: 49 ug/dL (ref 28–170)
Saturation Ratios: 14 % (ref 10.4–31.8)
TIBC: 347 ug/dL (ref 250–450)
UIBC: 298 ug/dL (ref 148–442)

## 2023-11-10 LAB — FERRITIN: Ferritin: 18 ng/mL (ref 11–307)

## 2023-11-10 NOTE — Assessment & Plan Note (Signed)
 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 Grade I -diagnosed 07/2018, s/p left breast lumpectomy and adjuvant radiation. Due to small tumor size, oncotype was not recommended.  -She discontinued tamoxifen  after 10 months due to arthritis. She is on surveillance now -Mammogram 07/29/22 normal, breast density cat C. I recommend abbreviated breast MRI for screening, staggered 6 months apart from mammo. Would start in 01/2023. She will think about it -Ms. Krull is clinically doing well. Breast exam is benign. Labs are normal. Overall no clinical or radiographic concern for recurrence -continue surveillance

## 2023-11-10 NOTE — Progress Notes (Signed)
 [facility]   [phone] [phone]   Clinic Follow up Note   Patient Care Team: Severa Rock HERO, FNP as PCP - General (Family Medicine) Gail Favorite, MD as Consulting Physician (General Surgery) Lanny Callander, MD as Consulting Physician (Hematology) Shannon Agent, MD as Consulting Physician (Radiation Oncology) Burton, Lacie K, NP as Nurse Practitioner (Nurse Practitioner)  Date of Service:  11/10/2023  CHIEF COMPLAINT: f/u of left breast cancer  CURRENT THERAPY:  Surveillance  Oncology History   Malignant neoplasm of upper-inner quadrant of left breast in female, 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 Grade I -diagnosed 07/2018, s/p left breast lumpectomy and adjuvant radiation. Due to small tumor size, oncotype was not recommended.  -She discontinued tamoxifen  after 10 months due to arthritis. She is on surveillance now -Mammogram 07/29/22 normal, breast density cat C. I recommend abbreviated breast MRI for screening, staggered 6 months apart from mammo. Would start in 01/2023. She will think about it -Ms. Bronkema is clinically doing well. Breast exam is benign. Labs are normal. Overall no clinical or radiographic concern for recurrence -continue surveillance   Assessment and Plan Breast Cancer Follow-Up 63 year old with a history of left breast cancer, status post-surgery. No new issues. Normal diagnostic exam in September 2024. Dense, lumpy breast tissue. Family history of breast cancer (sister). Discussed breast MRI due to high breast density; optional with a $400 out-of-pocket cost. - Order mammogram for next year - Discuss breast MRI for additional screening if she decides to proceed  Chronic Constipation Ongoing incomplete bowel movements and gas buildup. Normal recent colonoscopy. No abdominal surgery history except for gallbladder removal. Tried Miralax  and Metamucil but experiences  stomach upset with high fiber intake. - Use over-the-counter medications as needed to regulate bowel movements - Consider stool softeners and Miralax   Vaginal Dryness Reports significant dryness. Provided a sample of a non-estrogen containing product. - Try the provided sample for vaginal dryness - Use provided coupon if needed  General Health Maintenance Slightly low white count (3.6) but normal neutrophil count. Normal hemoglobin, platelet count, kidney function, and potassium levels. Normal bone density in 2023; next test can be deferred for another year or two. Follow-up with family doctor for routine checkup and additional blood work in February 2025. - Monitor bone density, consider testing in 2026 - Follow up with family doctor for routine checkup and additional blood work in February 2025  Follow-up -She is clinically doing well, no concern for recurrence, will continue cancer surveillance -schedule follow-up appointment in one year -I ordered a mammogram for her    SUMMARY OF ONCOLOGIC HISTORY: Oncology History Overview Note  Cancer Staging Malignant neoplasm of upper-inner quadrant of left breast in female, estrogen receptor positive (HCC) Staging form: Breast, AJCC 8th Edition - Clinical stage from 07/23/2018: Stage IA (cT1b, cN0, cM0, G1, ER+, PR+, HER2-) - Signed by Lanny Callander, MD on 08/01/2018 - Pathologic stage from 08/31/2018: Stage IA (pT1b, pN0, cM0, G1, ER+, PR+, HER2-) - Signed by Lanny Callander, MD on 11/26/2018     Malignant neoplasm of upper-inner quadrant of left breast in female, estrogen receptor positive (HCC)  07/13/2018 Mammogram   07/13/2018 Screening Mammogram IMPRESSION: Further evaluation is suggested for possible distortion in the left breast   07/19/2018 Breast US     07/19/2018 Breast US  IMPRESSION: 0.6 cm irregular mass with associated architectural distortion in the 9:30 position of the left breast 4 cm from the nipple. Findings are  suspicious for  malignancy.   Negative for left axillary lymphadenopathy   07/19/2018 Mammogram   07/19/2018 Diagnostic Mammogram IMPRESSION: 0.6 cm irregular mass with associated architectural distortion in the 9:30 position of the left breast 4 cm from the nipple. Findings are suspicious for malignancy.   Negative for left axillary lymphadenopathy.   07/23/2018 Cancer Staging   Staging form: Breast, AJCC 8th Edition - Clinical stage from 07/23/2018: Stage IA (cT1b, cN0, cM0, G1, ER+, PR+, HER2-) - Signed by Lanny Callander, MD on 08/01/2018   07/23/2018 Receptors her2   ADDITIONAL INFORMATION: PROGNOSTIC INDICATORS Results: IMMUNOHISTOCHEMICAL AND MORPHOMETRIC ANALYSIS PERFORMED MANUALLY The tumor cells are Negative for Her2 (1+). Estrogen Receptor: 90%, POSITIVE, STRONG STAINING INTENSITY Progesterone Receptor: 90%, POSITIVE, STRONG STAINING INTENSITY Proliferation Marker Ki67: 10%   07/23/2018 Pathology Results   Diagnosis Breast, left, needle core biopsy, 9:30 o'clock, 4 cm fn - INVASIVE DUCTAL CARCINOMA, GRADE I. SEE NOTE.   07/27/2018 Initial Diagnosis   Malignant neoplasm of upper-inner quadrant of left breast in female, estrogen receptor positive (HCC)   08/16/2018 Genetic Testing   The Multi-Cancer Panel offered by [facility] includes sequencing and/or deletion duplication testing of the following 91 genes: AIP, ALK, APC, ATM, AXIN2, BAP1, BARD1, BLM, BMPR1A, BRCA1, BRCA2, BRIP1, BUB1B, CASR, CDC73, CDH1, CDK4, CDKN1B, CDKN1C, CDKN2A, CEBPA, CEP57, CHEK2, CTNNA1, DICER1, DIS3L2, EGFR, ENG, EPCAM, FH, FLCN, GALNT12, GATA2, GPC3, GREM1, HOXB13, HRAS, KIT, MAX, MEN1, MET, MITF, MLH1, MLH3, MSH2, MSH3, MSH6, MUTYH, NBN, NF1, NF2, NTHL1, PALB2, PDGFRA, PHOX2B, PMS2, POLD1, POLE, POT1, PRKAR1A, PTCH1, PTEN, RAD50, RAD51C, RAD51D, RB1, RECQL4, RET, RNF43, RPS20, RUNX1, SDHA, SDHAF2, SDHB, SDHC, SDHD, SMAD4, SMARCA4, SMARCB1, SMARCE1, STK11, SUFU, TERC, TERT, TMEM127, TP53, TSC1, TSC2, VHL, WRN, WT1  Results:  Negative, no pathogenic variants identified.  The date of this test report is 08/16/2018.    08/31/2018 Surgery   LEFT DOUBLE BRACKETED LUMPECTOMY WITH RADIOACTIVE SEED X'S 2, LEFT SENTINEL LYMPH NODE BIOPSY, INJECT BLUE DYE LEFT BREAST by Dr. Gail 08/31/18   08/31/2018 Pathology Results   Diagnosis 08/31/18  1. Breast, lumpectomy, left - INVASIVE DUCTAL CARCINOMA, [place] GRADE 1 OF 3, 0.7 CM - CALCIFICATIONS ASSOCIATED WITH CARCINOMA - MARGINS UNINVOLVED BY CARCINOMA (0.5 CM; POSTERIOR MARGIN) - COLUMNAR CELL AND FIBROCYSTIC CHANGES INCLUDING APOCRINE METAPLASIA - PSEUDOANGIOMATOUS STROMAL HYPERPLASIA - PREVIOUS BIOPSY SITE CHANGES PRESENT - SEE ONCOLOGY TABLE AND COMMENT BELOW 2. Lymph node, sentinel, biopsy, left axillary #1 - NO CARCINOMA IDENTIFIED IN ONE LYMPH NODE (0/1) 3. Lymph node, sentinel, biopsy, left axillary #2 - NO CARCINOMA IDENTIFIED IN ONE LYMPH NODE (0/1) 4. Lymph node, sentinel, biopsy, left axillary #3 - NO CARCINOMA IDENTIFIED IN ONE LYMPH NODE (0/1)   08/31/2018 Cancer Staging   Staging form: Breast, AJCC 8th Edition - Pathologic stage from 08/31/2018: Stage IA (pT1b, pN0, cM0, G1, ER+, PR+, HER2-) - Signed by Lanny Callander, MD on 11/26/2018   10/30/2018 - 11/28/2018 Radiation Therapy   Adjuvant Radiation with Dr. Brigid 10/30/18-11/28/18   11/2018 - 09/2019 Anti-estrogen oral therapy   Tamoxifen  20mg  daily started 11/2018. Stopped in 09/2019 due to arthritis. She opted to not restart.    06/06/2019 Survivorship   Per Mayme Silversmith, NP        Discussed the use of AI scribe software for clinical note transcription with the patient, who gave verbal consent to proceed.   History of Present Illness   The patient, a 63 year old female with a history of breast cancer, presents for a routine follow-up. She reports ongoing intestinal  issues, including incomplete bowel movements and gas buildup. She has tried increasing her fiber intake, but this exacerbates her  stomach discomfort. She has undergone a colonoscopy, which was normal. She has also tried over-the-counter medications, including Miralax  and Metamucil, with limited success. She has a history of gallbladder surgery, which may be impacting her digestion.  Regarding her breast cancer, she reports no new issues. She had a diagnostic mammogram in September of the previous year, which was normal. She has no pain or discomfort in her breasts. She had a small tumor five years ago, which was treated with tamoxifen , but she had to discontinue the medication due to side effects. She is also monitoring her bone density, with the last test in 2023 showing normal results. She is considering additional screening with a breast MRI due to her high breast density and a family history of breast cancer (her sister had breast cancer and later developed a different type of cancer).         All other systems were reviewed with the patient and are negative.  MEDICAL HISTORY:  Past Medical History:  Diagnosis Date   Anxiety    Arthritis    Cancer (HCC) 08/02/2018   left breast cancer   Family history of breast cancer    Family history of esophageal cancer    Family history of lymphoma    Family history of ovarian cancer    GERD (gastroesophageal reflux disease)    Hypothyroidism    Neck pain    Personal history of radiation therapy     SURGICAL HISTORY: Past Surgical History:  Procedure Laterality Date   ANKLE FRACTURE SURGERY Left    BREAST LUMPECTOMY Left 08/2018   BREAST LUMPECTOMY WITH RADIOACTIVE SEED AND SENTINEL LYMPH NODE BIOPSY Left 08/31/2018   Procedure: LEFT DOUBLE BRACKETED LUMPECTOMY WITH RADIOACTIVE SEED X'S 2, LEFT SENTINEL LYMPH NODE BIOPSY, INJECT BLUE DYE LEFT BREAST;  Surgeon: Gail Favorite, MD;  Location: [facility];  Service: General;  Laterality: Left;   CHOLECYSTECTOMY     TUBAL LIGATION      I have reviewed the social history and family history with the patient  and they are unchanged from previous note.  ALLERGIES:  has no known allergies.  MEDICATIONS:  Current Outpatient Medications  Medication Sig Dispense Refill   cyclobenzaprine  (FLEXERIL ) 5 MG tablet Take 1 tablet (5 mg total) by mouth 3 (three) times daily as needed for muscle spasms. 30 tablet 6   esomeprazole  (NEXIUM ) 40 MG capsule TAKE 1 CAPSULE (40 MG TOTAL) BY MOUTH DAILY. 90 capsule 1   FLUoxetine  (PROZAC ) 10 MG capsule Take 1 capsule (10 mg total) by mouth daily. 90 capsule 3   fluticasone  (FLONASE ) 50 MCG/ACT nasal spray SPRAY 2 SPRAYS INTO EACH NOSTRIL EVERY DAY 48 mL 2   levocetirizine (XYZAL ) 5 MG tablet Take 1 tablet (5 mg total) by mouth every evening. 90 tablet 1   levothyroxine  (SYNTHROID ) 25 MCG tablet TAKE 1 TABLET DAILY BEFORE BREAKFAST FOR HYPOTHYROIDISM 90 tablet 1   Nasal Dilators (NASAL STRIPS) STRP 1 strip by Does not apply route at bedtime. Breathing strips     polyethylene glycol powder (GLYCOLAX /MIRALAX ) 17 GM/SCOOP powder Take 17 g by mouth daily. 3350 g 1   Vitamin D , Ergocalciferol , (DRISDOL ) 1.25 MG (50000 UNIT) CAPS capsule TAKE 1 CAPSULE (50,000 UNITS TOTAL) BY MOUTH EVERY 7 (SEVEN) DAYS 5 capsule 3   No current facility-administered medications for this visit.    PHYSICAL EXAMINATION: ECOG PERFORMANCE STATUS:  0 - Asymptomatic  Vitals:   11/10/23 1120 11/10/23 1121  BP: (!) 147/82 134/77  Pulse: 81   Resp: 18   Temp: 98.3 F (36.8 C)   SpO2: 98%    Wt Readings from Last 3 Encounters:  11/10/23 150 lb (68 kg)  06/07/23 140 lb (63.5 kg)  12/06/22 142 lb 9.6 oz (64.7 kg)     GENERAL:alert, no distress and comfortable SKIN: skin color, texture, turgor are normal, no rashes or significant lesions EYES: normal, Conjunctiva are pink and non-injected, sclera clear NECK: supple, thyroid  normal size, non-tender, without nodularity LYMPH:  no palpable lymphadenopathy in the cervical, axillary  LUNGS: clear to auscultation and percussion with normal  breathing effort HEART: regular rate & rhythm and no murmurs and no lower extremity edema ABDOMEN:abdomen soft, non-tender and normal bowel sounds Musculoskeletal:no cyanosis of digits and no clubbing  NEURO: alert & oriented x 3 with fluent speech, no focal motor/sensory deficits Breast exam showed no palpable breast mass or axillary adenopathy     LABORATORY DATA:  I have reviewed the data as listed    Latest Ref Rng & Units 11/10/2023   10:44 AM 06/07/2023   11:00 AM 04/26/2023   10:08 AM  CBC  WBC 4.0 - 10.5 K/uL 3.6  5.5  6.6   Hemoglobin 12.0 - 15.0 g/dL 87.4  85.2  85.6   Hematocrit 36.0 - 46.0 % 35.5  43.6  41.4   Platelets 150 - 400 K/uL 266  280  260         Latest Ref Rng & Units 11/10/2023   10:44 AM 06/07/2023   11:00 AM 04/26/2023   10:08 AM  CMP  Glucose 70 - 99 mg/dL 86  895  888   BUN 8 - 23 mg/dL 6  10  9    Creatinine 0.44 - 1.00 mg/dL 9.24  9.28  9.23   Sodium 135 - 145 mmol/L 137  135  138   Potassium 3.5 - 5.1 mmol/L 3.6  5.4  4.2   Chloride 98 - 111 mmol/L 104  100  106   CO2 22 - 32 mmol/L 28  21  26    Calcium 8.9 - 10.3 mg/dL 9.1  89.9  9.5   Total Protein 6.5 - 8.1 g/dL 6.4  6.5  6.3   Total Bilirubin 0.0 - 1.2 mg/dL 0.3  0.5  0.4   Alkaline Phos 38 - 126 U/L 53  70  62   AST 15 - 41 U/L 20  18  20    ALT 0 - 44 U/L 20  18  22        RADIOGRAPHIC STUDIES: I have personally reviewed the radiological images as listed and agreed with the findings in the report. No results found.    Orders Placed This Encounter  Procedures   MM 3D DIAGNOSTIC MAMMOGRAM BILATERAL BREAST    Standing Status:   Future    Expected Date:   08/01/2024    Expiration Date:   11/09/2024    Reason for Exam (SYMPTOM  OR DIAGNOSIS REQUIRED):   screening    Preferred imaging location?:   [facility]   All questions were answered. The patient knows to call the clinic with any problems, questions or concerns. No barriers to learning was detected. The total time spent in the  appointment was 25 minutes.     Onita Mattock, MD 11/10/2023

## 2023-12-12 ENCOUNTER — Ambulatory Visit: Payer: 59 | Admitting: Family Medicine

## 2023-12-12 ENCOUNTER — Encounter: Payer: Self-pay | Admitting: Family Medicine

## 2023-12-12 VITALS — BP 128/65 | HR 74 | Temp 97.6°F | Ht 64.0 in | Wt 150.2 lb

## 2023-12-12 DIAGNOSIS — R5383 Other fatigue: Secondary | ICD-10-CM | POA: Diagnosis not present

## 2023-12-12 DIAGNOSIS — K219 Gastro-esophageal reflux disease without esophagitis: Secondary | ICD-10-CM | POA: Diagnosis not present

## 2023-12-12 DIAGNOSIS — F411 Generalized anxiety disorder: Secondary | ICD-10-CM

## 2023-12-12 DIAGNOSIS — E559 Vitamin D deficiency, unspecified: Secondary | ICD-10-CM | POA: Diagnosis not present

## 2023-12-12 DIAGNOSIS — E039 Hypothyroidism, unspecified: Secondary | ICD-10-CM

## 2023-12-12 DIAGNOSIS — E7800 Pure hypercholesterolemia, unspecified: Secondary | ICD-10-CM

## 2023-12-12 DIAGNOSIS — R1084 Generalized abdominal pain: Secondary | ICD-10-CM

## 2023-12-12 DIAGNOSIS — M159 Polyosteoarthritis, unspecified: Secondary | ICD-10-CM

## 2023-12-12 DIAGNOSIS — R5381 Other malaise: Secondary | ICD-10-CM

## 2023-12-12 LAB — LIPID PANEL

## 2023-12-12 NOTE — Progress Notes (Signed)
 Subjective:  Patient ID: Dawn Rogers, female    DOB: September 19, 1961, 63 y.o.   MRN: [id number]  Patient Care Team: Severa Rock HERO, FNP as PCP - General (Family Medicine) Gail Favorite, MD as Consulting Physician (General Surgery) Lanny Callander, MD as Consulting Physician (Hematology) Shannon Agent, MD as Consulting Physician (Radiation Oncology) Burton, Lacie K, NP as Nurse Practitioner (Nurse Practitioner)   Chief Complaint:  Medical Management of Chronic Issues (6 month)   HPI: Dawn Rogers is a 63 y.o. female presenting on 12/12/2023 for Medical Management of Chronic Issues (6 month)   Discussed the use of AI scribe software for clinical note transcription with the patient, who gave verbal consent to proceed.  History of Present Illness   Dawn Rogers is a 63 year old female who presents for follow-up on her digestive health and other chronic conditions.  She has ongoing digestive issues, including trouble with her digestive system and a history of leakage. She was previously advised to increase fiber intake and was prescribed Miralax  and Metamucil. However, she experienced adverse effects from Metamucil, such as increased leakage, and discontinued its use. She inquires about the safety of using Miralax  once or twice a week. A colonoscopy performed shortly after her last visit showed no issues, and she felt better post-procedure. She suspects certain foods, particularly those containing gluten, exacerbate her symptoms. She has a family history of gluten intolerance and celiac disease, and she recalls being tested for it some time ago.  She mentions a history of reflux, which is not currently bothersome. She takes medication occasionally to prevent stomach issues when using anti-inflammatories for arthritis. Forgetting her medication may lead to mild reflux, but she has not experienced significant symptoms such as voice changes or blood in stool.  She is on 25 mcg of levothyroxine  and  reports no significant fatigue or changes in hair, skin, or nails. She has experienced hair loss for a long time.  She notes a decrease in physical activity, having stopped her routine of walking three miles daily. She experiences exertional fatigue and weakness in her legs, which improves with rest. She attributes this to deconditioning, as she has not maintained her exercise routine for a couple of years.  She has a history of arthritis, with symptoms varying day-to-day. She has not identified specific triggers but notes that some days are worse than others. She manages her symptoms with anti-inflammatories as needed.  Regarding her mental health, she continues to take fluoxetine  10 mg daily, which generally manages her symptoms well, though she occasionally experiences a 'real nervous edge.'          Relevant past medical, surgical, family, and social history reviewed and updated as indicated.  Allergies and medications reviewed and updated. Data reviewed: Chart in Epic.   Past Medical History:  Diagnosis Date   Anxiety    Arthritis    Cancer (HCC) 08/02/2018   left breast cancer   Family history of breast cancer    Family history of esophageal cancer    Family history of lymphoma    Family history of ovarian cancer    GERD (gastroesophageal reflux disease)    Hypothyroidism    Neck pain    Personal history of radiation therapy     Past Surgical History:  Procedure Laterality Date   ANKLE FRACTURE SURGERY Left    BREAST LUMPECTOMY Left 08/2018   BREAST LUMPECTOMY WITH RADIOACTIVE SEED AND SENTINEL LYMPH NODE BIOPSY Left 08/31/2018  Procedure: LEFT DOUBLE BRACKETED LUMPECTOMY WITH RADIOACTIVE SEED X'S 2, LEFT SENTINEL LYMPH NODE BIOPSY, INJECT BLUE DYE LEFT BREAST;  Surgeon: Gail Favorite, MD;  Location: [facility];  Service: General;  Laterality: Left;   CHOLECYSTECTOMY     TUBAL LIGATION      Social History   Socioeconomic History   Marital status:  Married    Spouse name: Not on file   Number of children: Not on file   Years of education: Not on file   Highest education level: Associate degree: occupational, scientist, product/process development, or vocational program  Occupational History   Not on file  Tobacco Use   Smoking status: Never   Smokeless tobacco: Never  Substance and Sexual Activity   Alcohol use: Never   Drug use: Never   Sexual activity: Not on file  Other Topics Concern   Not on file  Social History Narrative   Not on file   Social Drivers of Health   Financial Resource Strain: Low Risk  (12/11/2023)   Overall Financial Resource Strain (CARDIA)    Difficulty of Paying Living Expenses: Not hard at all  Food Insecurity: No Food Insecurity (12/11/2023)   Hunger Vital Sign    Worried About Running Out of Food in the Last Year: Never true    Ran Out of Food in the Last Year: Never true  Transportation Needs: No Transportation Needs (12/11/2023)   PRAPARE - Administrator, Civil Service (Medical): No    Lack of Transportation (Non-Medical): No  Physical Activity: Inactive (12/11/2023)   Exercise Vital Sign    Days of Exercise per Week: 0 days    Minutes of Exercise per Session: 30 min  Stress: No Stress Concern Present (12/11/2023)   [employer] of Occupational Health - Occupational Stress Questionnaire    Feeling of Stress : Only a little  Social Connections: Socially Integrated (12/11/2023)   Social Connection and Isolation Panel [NHANES]    Frequency of Communication with Friends and Family: More than three times a week    Frequency of Social Gatherings with Friends and Family: More than three times a week    Attends Religious Services: More than 4 times per year    Active Member of [employer] or Organizations: Yes    Attends Engineer, Structural: More than 4 times per year    Marital Status: Married  Catering Manager Violence: Unknown (02/09/2022)   Received from [employer], [facility]   HITS    Physically  Hurt: Not on file    Insult or Talk Down To: Not on file    Threaten Physical Harm: Not on file    Scream or Curse: Not on file    Outpatient Encounter Medications as of 12/12/2023  Medication Sig   cyclobenzaprine  (FLEXERIL ) 5 MG tablet Take 1 tablet (5 mg total) by mouth 3 (three) times daily as needed for muscle spasms.   esomeprazole  (NEXIUM ) 40 MG capsule TAKE 1 CAPSULE (40 MG TOTAL) BY MOUTH DAILY.   FLUoxetine  (PROZAC ) 10 MG capsule Take 1 capsule (10 mg total) by mouth daily.   fluticasone  (FLONASE ) 50 MCG/ACT nasal spray SPRAY 2 SPRAYS INTO EACH NOSTRIL EVERY DAY   levocetirizine (XYZAL ) 5 MG tablet Take 1 tablet (5 mg total) by mouth every evening.   levothyroxine  (SYNTHROID ) 25 MCG tablet TAKE 1 TABLET DAILY BEFORE BREAKFAST FOR HYPOTHYROIDISM   Nasal Dilators (NASAL STRIPS) STRP 1 strip by Does not apply route at bedtime. Breathing strips  polyethylene glycol powder (GLYCOLAX /MIRALAX ) 17 GM/SCOOP powder Take 17 g by mouth daily.   Vitamin D , Ergocalciferol , (DRISDOL ) 1.25 MG (50000 UNIT) CAPS capsule TAKE 1 CAPSULE (50,000 UNITS TOTAL) BY MOUTH EVERY 7 (SEVEN) DAYS   No facility-administered encounter medications on file as of 12/12/2023.    No Known Allergies  Pertinent ROS per HPI, otherwise unremarkable      Objective:  BP 128/65   Pulse 74   Temp 97.6 F (36.4 C) (Temporal)   Ht 5' 4 (1.626 m)   Wt 150 lb 3.2 oz (68.1 kg)   LMP 03/12/2018 Comment: going thru change  SpO2 99%   BMI 25.78 kg/m    Wt Readings from Last 3 Encounters:  12/12/23 150 lb 3.2 oz (68.1 kg)  11/10/23 150 lb (68 kg)  06/07/23 140 lb (63.5 kg)    Physical Exam Vitals and nursing note reviewed.  Constitutional:      Appearance: Normal appearance.  HENT:     Head: Normocephalic and atraumatic.     Nose: Nose normal.     Mouth/Throat:     Mouth: Mucous membranes are moist.  Eyes:     Conjunctiva/sclera: Conjunctivae normal.     Pupils: Pupils are equal, round, and reactive to  light.  Cardiovascular:     Rate and Rhythm: Normal rate and regular rhythm.     Heart sounds: Normal heart sounds.  Pulmonary:     Effort: Pulmonary effort is normal.     Breath sounds: Normal breath sounds.  Musculoskeletal:     Cervical back: Neck supple.     Right lower leg: No edema.     Left lower leg: No edema.  Skin:    General: Skin is warm and dry.     Capillary Refill: Capillary refill takes less than 2 seconds.  Neurological:     General: No focal deficit present.     Mental Status: She is alert and oriented to person, place, and time.  Psychiatric:        Mood and Affect: Mood normal.        Behavior: Behavior normal.        Thought Content: Thought content normal.        Judgment: Judgment normal.      Results for orders placed or performed in visit on 11/10/23  Iron and Iron Binding Capacity ([facility] only)   Collection Time: 11/10/23 10:44 AM  Result Value Ref Range   Iron 49 28 - 170 ug/dL   TIBC 652 749 - 549 ug/dL   Saturation Ratios 14 10.4 - 31.8 %   UIBC 298 148 - 442 ug/dL  Ferritin   Collection Time: 11/10/23 10:44 AM  Result Value Ref Range   Ferritin 18 11 - 307 ng/mL  CMP (Cancer Center only)   Collection Time: 11/10/23 10:44 AM  Result Value Ref Range   Sodium 137 135 - 145 mmol/L   Potassium 3.6 3.5 - 5.1 mmol/L   Chloride 104 98 - 111 mmol/L   CO2 28 22 - 32 mmol/L   Glucose, Bld 86 70 - 99 mg/dL   BUN 6 (L) 8 - 23 mg/dL   Creatinine 9.24 9.55 - 1.00 mg/dL   Calcium 9.1 8.9 - 89.6 mg/dL   Total Protein 6.4 (L) 6.5 - 8.1 g/dL   Albumin 3.9 3.5 - 5.0 g/dL   AST 20 15 - 41 U/L   ALT 20 0 - 44 U/L   Alkaline Phosphatase 53 38 - 126  U/L   Total Bilirubin 0.3 0.0 - 1.2 mg/dL   GFR, Estimated >39 >39 mL/min   Anion gap 5 5 - 15  CBC with Differential (Cancer Center Only)   Collection Time: 11/10/23 10:44 AM  Result Value Ref Range   WBC Count 3.6 (L) 4.0 - 10.5 K/uL   RBC 3.77 (L) 3.87 - 5.11 MIL/uL   Hemoglobin 12.5 12.0 - 15.0  g/dL   HCT 64.4 (L) 63.9 - 53.9 %   MCV 94.2 80.0 - 100.0 fL   MCH 33.2 26.0 - 34.0 pg   MCHC 35.2 30.0 - 36.0 g/dL   RDW 87.4 88.4 - 84.4 %   Platelet Count 266 150 - 400 K/uL   nRBC 0.0 0.0 - 0.2 %   Neutrophils Relative % 58 %   Neutro Abs 2.1 1.7 - 7.7 K/uL   Lymphocytes Relative 27 %   Lymphs Abs 1.0 0.7 - 4.0 K/uL   Monocytes Relative 11 %   Monocytes Absolute 0.4 0.1 - 1.0 K/uL   Eosinophils Relative 3 %   Eosinophils Absolute 0.1 0.0 - 0.5 K/uL   Basophils Relative 1 %   Basophils Absolute 0.0 0.0 - 0.1 K/uL   Immature Granulocytes 0 %   Abs Immature Granulocytes 0.01 0.00 - 0.07 K/uL     EKG: SR, 68, PR 172 ms, AT 366 ms, no acute ST-T changes, no ectopy. No prior for comparison. Rosaline Bruns, FNP-C  Pertinent labs & imaging results that were available during my care of the patient were reviewed by me and considered in my medical decision making.  Assessment & Plan:  Dawn Rogers was seen today for medical management of chronic issues.  Diagnoses and all orders for this visit:  Acquired hypothyroidism -     Thyroid  Panel With TSH  Vitamin D  deficiency -     CMP14+EGFR -     VITAMIN D  25 Hydroxy (Vit-D Deficiency, Fractures)  Pure hypercholesterolemia -     Lipid panel -     EKG 12-Lead  Gastroesophageal reflux disease without esophagitis -     CBC with Differential/Platelet  Polyarticular osteoarthritis -     CMP14+EGFR -     VITAMIN D  25 Hydroxy (Vit-D Deficiency, Fractures)  GAD (generalized anxiety disorder) -     Thyroid  Panel With TSH  Generalized abdominal pain -     CBC with Differential/Platelet -     CMP14+EGFR -     Celiac Disease Comprehensive Panel with Reflexes  Malaise and fatigue -     EKG 12-Lead     Assessment and Plan    Digestive Issues Chronic digestive issues with symptoms of leakage and intolerance to high fiber intake. Previous colonoscopy was normal. Possible gluten intolerance due to family history of celiac disease.  Discussed MiraLAX  for symptom relief and the potential exacerbation of symptoms by high fiber intake. Agreed to perform a celiac panel to reassess for gluten intolerance. - Order celiac panel - Advise MiraLAX  once or twice a week as needed  Exertional Fatigue Exertional fatigue and leg weakness likely due to deconditioning from lack of regular exercise. No significant cardiac symptoms reported. Discussed the importance of gradually increasing physical activity to improve conditioning. Agreed to perform an EKG to rule out cardiac issues. - Order EKG - Encourage gradual increase in physical activity  Hypothyroidism Well-managed on Synthroid  (25 mcg). No significant symptoms of fatigue, changes in hair, skin, or nails. Discussed the importance of regular TSH monitoring. - Check TSH levels  Arthritis Intermittent joint pain, possibly exacerbated by weather changes. Managed with anti-inflammatories as needed. Discussed the variable nature of arthritis symptoms and the role of anti-inflammatories in managing flare-ups. - Continue anti-inflammatories as needed  General Health Maintenance Routine health maintenance and lab checks. Discussed the importance of regular screenings and lab work to monitor overall health. - Order comprehensive lab panel including vitamin D  and calcium levels - Schedule follow-up in six months  Follow-up - Schedule follow-up appointment in six months.          Continue all other maintenance medications.  Follow up plan: Return in about 6 months (around 06/10/2024) for Annual Physical.   Continue healthy lifestyle choices, including diet (rich in fruits, vegetables, and lean proteins, and low in salt and simple carbohydrates) and exercise (at least 30 minutes of moderate physical activity daily).   The above assessment and management plan was discussed with the patient. The patient verbalized understanding of and has agreed to the management plan. Patient is  aware to call the clinic if they develop any new symptoms or if symptoms persist or worsen. Patient is aware when to return to the clinic for a follow-up visit. Patient educated on when it is appropriate to go to the emergency department.   Rosaline Bruns, FNP-C [facility] [phone]

## 2023-12-13 ENCOUNTER — Encounter: Payer: Self-pay | Admitting: Family Medicine

## 2023-12-15 LAB — CMP14+EGFR
ALT: 18 [IU]/L (ref 0–32)
AST: 26 [IU]/L (ref 0–40)
Albumin: 4.1 g/dL (ref 3.9–4.9)
Alkaline Phosphatase: 66 [IU]/L (ref 44–121)
BUN/Creatinine Ratio: 10 — ABNORMAL LOW (ref 12–28)
BUN: 8 mg/dL (ref 8–27)
Bilirubin Total: 0.2 mg/dL (ref 0.0–1.2)
CO2: 21 mmol/L (ref 20–29)
Calcium: 8.9 mg/dL (ref 8.7–10.3)
Chloride: 99 mmol/L (ref 96–106)
Creatinine, Ser: 0.79 mg/dL (ref 0.57–1.00)
Globulin, Total: 1.9 g/dL (ref 1.5–4.5)
Glucose: 90 mg/dL (ref 70–99)
Potassium: 4.2 mmol/L (ref 3.5–5.2)
Sodium: 137 mmol/L (ref 134–144)
Total Protein: 6 g/dL (ref 6.0–8.5)
eGFR: 85 mL/min/{1.73_m2} (ref >59)

## 2023-12-15 LAB — CBC WITH DIFFERENTIAL/PLATELET
Basophils Absolute: 0 10*3/uL (ref 0.0–0.2)
Basos: 1 %
EOS (ABSOLUTE): 0.2 10*3/uL (ref 0.0–0.4)
Eos: 5 %
Hematocrit: 38.2 % (ref 34.0–46.6)
Hemoglobin: 12.7 g/dL (ref 11.1–15.9)
Immature Grans (Abs): 0 10*3/uL (ref 0.0–0.1)
Immature Granulocytes: 0 %
Lymphocytes Absolute: 1.5 10*3/uL (ref 0.7–3.1)
Lymphs: 33 %
MCH: 31.5 pg (ref 26.6–33.0)
MCHC: 33.2 g/dL (ref 31.5–35.7)
MCV: 95 fL (ref 79–97)
Monocytes Absolute: 0.4 10*3/uL (ref 0.1–0.9)
Monocytes: 10 %
Neutrophils Absolute: 2.4 10*3/uL (ref 1.4–7.0)
Neutrophils: 51 %
Platelets: 286 10*3/uL (ref 150–450)
RBC: 4.03 x10E6/uL (ref 3.77–5.28)
RDW: 11.5 % — ABNORMAL LOW (ref 11.7–15.4)
WBC: 4.5 10*3/uL (ref 3.4–10.8)

## 2023-12-15 LAB — LIPID PANEL
Chol/HDL Ratio: 2.8 {ratio} (ref 0.0–4.4)
Cholesterol, Total: 216 mg/dL — ABNORMAL HIGH (ref 100–199)
HDL: 77 mg/dL (ref >39)
LDL Chol Calc (NIH): 126 mg/dL — ABNORMAL HIGH (ref 0–99)
Triglycerides: 72 mg/dL (ref 0–149)
VLDL Cholesterol Cal: 13 mg/dL (ref 5–40)

## 2023-12-15 LAB — CELIAC DISEASE COMPREHENSIVE PANEL WITH REFLEXES
IgA/Immunoglobulin A, Serum: 155 mg/dL (ref 87–352)
Transglutaminase IgA: <2 U/mL (ref 0–3)

## 2023-12-15 LAB — THYROID PANEL WITH TSH
Free Thyroxine Index: 2.6 (ref 1.2–4.9)
T3 Uptake Ratio: 25 % (ref 24–39)
T4, Total: 10.3 ug/dL (ref 4.5–12.0)
TSH: 1.95 u[IU]/mL (ref 0.450–4.500)

## 2023-12-15 LAB — VITAMIN D 25 HYDROXY (VIT D DEFICIENCY, FRACTURES): Vit D, 25-Hydroxy: 64.7 ng/mL (ref 30.0–100.0)

## 2024-02-13 ENCOUNTER — Other Ambulatory Visit: Payer: Self-pay | Admitting: Family Medicine

## 2024-02-13 DIAGNOSIS — K219 Gastro-esophageal reflux disease without esophagitis: Secondary | ICD-10-CM

## 2024-02-14 ENCOUNTER — Other Ambulatory Visit: Payer: Self-pay | Admitting: Family Medicine

## 2024-02-14 DIAGNOSIS — J309 Allergic rhinitis, unspecified: Secondary | ICD-10-CM

## 2024-02-18 ENCOUNTER — Other Ambulatory Visit: Payer: Self-pay | Admitting: Family Medicine

## 2024-02-18 DIAGNOSIS — E559 Vitamin D deficiency, unspecified: Secondary | ICD-10-CM

## 2024-05-13 ENCOUNTER — Other Ambulatory Visit: Payer: Self-pay | Admitting: Family Medicine

## 2024-05-13 ENCOUNTER — Encounter: Payer: Self-pay | Admitting: Family Medicine

## 2024-05-13 DIAGNOSIS — E039 Hypothyroidism, unspecified: Secondary | ICD-10-CM

## 2024-05-13 MED ORDER — LEVOTHYROXINE SODIUM 25 MCG PO TABS
25.0000 ug | ORAL_TABLET | Freq: Every day | ORAL | 0 refills | Status: DC
Start: 2024-05-13 — End: 2024-06-11

## 2024-06-06 ENCOUNTER — Other Ambulatory Visit: Payer: Self-pay | Admitting: Family Medicine

## 2024-06-06 DIAGNOSIS — F411 Generalized anxiety disorder: Secondary | ICD-10-CM

## 2024-06-11 ENCOUNTER — Other Ambulatory Visit: Payer: Self-pay | Admitting: Family Medicine

## 2024-06-11 ENCOUNTER — Encounter: Payer: Self-pay | Admitting: Family Medicine

## 2024-06-11 DIAGNOSIS — E039 Hypothyroidism, unspecified: Secondary | ICD-10-CM

## 2024-06-11 DIAGNOSIS — E559 Vitamin D deficiency, unspecified: Secondary | ICD-10-CM

## 2024-06-11 MED ORDER — LEVOTHYROXINE SODIUM 25 MCG PO TABS
25.0000 ug | ORAL_TABLET | Freq: Every day | ORAL | 1 refills | Status: DC
Start: 2024-06-11 — End: 2024-06-11

## 2024-06-11 NOTE — Telephone Encounter (Signed)
 Noted during request from local [facility] for 30-d Levothyroxine  that last month pt usually gets this from [facility], looking through history she did not have any refills to them and last TSH was in Feb, sent refills till her next labs are due

## 2024-06-18 ENCOUNTER — Ambulatory Visit: Payer: 59 | Admitting: Family Medicine

## 2024-06-18 ENCOUNTER — Encounter: Payer: Self-pay | Admitting: Family Medicine

## 2024-06-18 VITALS — BP 129/82 | HR 69 | Temp 97.7°F | Ht 64.0 in | Wt 149.2 lb

## 2024-06-18 DIAGNOSIS — M542 Cervicalgia: Secondary | ICD-10-CM

## 2024-06-18 DIAGNOSIS — F411 Generalized anxiety disorder: Secondary | ICD-10-CM | POA: Diagnosis not present

## 2024-06-18 DIAGNOSIS — G8929 Other chronic pain: Secondary | ICD-10-CM

## 2024-06-18 DIAGNOSIS — E7800 Pure hypercholesterolemia, unspecified: Secondary | ICD-10-CM

## 2024-06-18 DIAGNOSIS — E039 Hypothyroidism, unspecified: Secondary | ICD-10-CM | POA: Diagnosis not present

## 2024-06-18 DIAGNOSIS — K219 Gastro-esophageal reflux disease without esophagitis: Secondary | ICD-10-CM | POA: Diagnosis not present

## 2024-06-18 DIAGNOSIS — Z0001 Encounter for general adult medical examination with abnormal findings: Secondary | ICD-10-CM

## 2024-06-18 DIAGNOSIS — M159 Polyosteoarthritis, unspecified: Secondary | ICD-10-CM

## 2024-06-18 DIAGNOSIS — Z0000 Encounter for general adult medical examination without abnormal findings: Secondary | ICD-10-CM

## 2024-06-18 DIAGNOSIS — E559 Vitamin D deficiency, unspecified: Secondary | ICD-10-CM

## 2024-06-18 MED ORDER — FLUOXETINE HCL 10 MG PO CAPS
10.0000 mg | ORAL_CAPSULE | Freq: Every day | ORAL | 1 refills | Status: AC
Start: 2024-06-18 — End: ?

## 2024-06-18 MED ORDER — LEVOTHYROXINE SODIUM 25 MCG PO TABS
25.0000 ug | ORAL_TABLET | Freq: Every day | ORAL | 3 refills | Status: AC
Start: 2024-06-18 — End: ?

## 2024-06-18 MED ORDER — ESOMEPRAZOLE MAGNESIUM 40 MG PO CPDR
40.0000 mg | DELAYED_RELEASE_CAPSULE | Freq: Every day | ORAL | 1 refills | Status: AC
Start: 2024-06-18 — End: ?

## 2024-06-18 NOTE — Progress Notes (Signed)
 Complete physical exam  Patient: Dawn Rogers   DOB: 1960-12-02   63 y.o. Female  MRN: [id number]  Subjective:    Chief Complaint  Patient presents with   Annual Exam    Dawn Rogers is a 63 y.o. female who presents today for a complete physical exam. She reports consuming a general diet. The patient does not participate in regular exercise at present. She generally feels fairly well. She reports sleeping fairly well. She does not have additional problems to discuss today.    Most recent fall risk assessment:    06/07/2023   10:36 AM  Fall Risk   Falls in the past year? 0     Most recent depression screenings:    06/18/2024   10:17 AM 06/07/2023   10:36 AM  PHQ 2/9 Scores  PHQ - 2 Score 0 0  PHQ- 9 Score 6 4    Vision:Within last year and Dental: No current dental problems and Receives regular dental care  Patient Active Problem List   Diagnosis Date Noted   Pure hypercholesterolemia 06/03/2022   Chronic allergic rhinitis 12/17/2021   History of radiation therapy 09/16/2021   History of malignant neoplasm of skin 08/20/2021   History of malignant neoplasm of breast 08/18/2021   Spondylolisthesis 08/25/2020   Chronic neck pain 08/25/2020   Family history of ovarian cancer 06/20/2019   Family history of breast cancer 06/20/2019   Family history of esophageal cancer 06/20/2019   GAD (generalized anxiety disorder) 06/20/2019   Gastroesophageal reflux disease without esophagitis 06/20/2019   Vitamin D  deficiency 06/20/2019   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   Cervical myelopathy with cervical radiculopathy (HCC) 09/11/2018   Family history of lymphoma    Malignant neoplasm of upper-inner quadrant of left breast in female, estrogen receptor positive (HCC) 07/27/2018   Acquired hypothyroidism 03/03/2014   Past Medical History:  Diagnosis Date   Anxiety    Arthritis    Cancer (HCC) 08/02/2018   left breast cancer   Family history of breast cancer     Family history of esophageal cancer    Family history of lymphoma    Family history of ovarian cancer    GERD (gastroesophageal reflux disease)    Hypothyroidism    Neck pain    Personal history of radiation therapy    Past Surgical History:  Procedure Laterality Date   ANKLE FRACTURE SURGERY Left    BREAST LUMPECTOMY Left 08/2018   BREAST LUMPECTOMY WITH RADIOACTIVE SEED AND SENTINEL LYMPH NODE BIOPSY Left 08/31/2018   Procedure: LEFT DOUBLE BRACKETED LUMPECTOMY WITH RADIOACTIVE SEED X'S 2, LEFT SENTINEL LYMPH NODE BIOPSY, INJECT BLUE DYE LEFT BREAST;  Surgeon: Gail Favorite, MD;  Location: [facility];  Service: General;  Laterality: Left;   CHOLECYSTECTOMY     TUBAL LIGATION     Social History   Tobacco Use   Smoking status: Never   Smokeless tobacco: Never  Substance Use Topics   Alcohol use: Never   Drug use: Never   Social History   Socioeconomic History   Marital status: Married    Spouse name: Not on file   Number of children: Not on file   Years of education: Not on file   Highest education level: Associate degree: occupational, Scientist, product/process development, or vocational program  Occupational History   Not on file  Tobacco Use   Smoking status: Never   Smokeless tobacco: Never  Substance and Sexual Activity   Alcohol use: Never   Drug use:  Never   Sexual activity: Not on file  Other Topics Concern   Not on file  Social History Narrative   Not on file   Social Drivers of Health   Financial Resource Strain: Low Risk  (12/11/2023)   Overall Financial Resource Strain (CARDIA)    Difficulty of Paying Living Expenses: Not hard at all  Food Insecurity: No Food Insecurity (12/11/2023)   Hunger Vital Sign    Worried About Running Out of Food in the Last Year: Never true    Ran Out of Food in the Last Year: Never true  Transportation Needs: No Transportation Needs (12/11/2023)   PRAPARE - Administrator, Civil Service (Medical): No    Lack of  Transportation (Non-Medical): No  Physical Activity: Inactive (12/11/2023)   Exercise Vital Sign    Days of Exercise per Week: 0 days    Minutes of Exercise per Session: 30 min  Stress: No Stress Concern Present (12/11/2023)   [employer] of Occupational Health - Occupational Stress Questionnaire    Feeling of Stress : Only a little  Social Connections: Socially Integrated (12/11/2023)   Social Connection and Isolation Panel    Frequency of Communication with Friends and Family: More than three times a week    Frequency of Social Gatherings with Friends and Family: More than three times a week    Attends Religious Services: More than 4 times per year    Active Member of Clubs or Organizations: Yes    Attends Banker Meetings: More than 4 times per year    Marital Status: Married  Catering manager Violence: Unknown (02/09/2022)   Received from [facility]   HITS    Physically Hurt: Not on file    Insult or Talk Down To: Not on file    Threaten Physical Harm: Not on file    Scream or Curse: Not on file   Family Status  Relation Name Status   Sister  Deceased   Mother  Deceased   PGF  Deceased   Daughter  Alive   Bruna Aunt  Deceased   Father  Deceased   Pat Aunt  Deceased   MGM  Deceased   MGF  Deceased   PGM  Deceased  No partnership data on file   Family History  Problem Relation Age of Onset   Breast cancer Sister 36   Lung cancer Sister    Leukemia Mother        'on the verge of leukemia'   Melanoma Mother    Cancer Paternal Grandfather        unsure kind of cancer- mass on neck   Lymphoma Daughter    Cancer Paternal Aunt        unsure kind of cancer    Lung cancer Paternal Aunt    Esophageal cancer Father 83   Ovarian cancer Paternal Aunt 27       also cancer in colon and liver-unk if primary or met   No Known Allergies    Patient Care Team: Lenox Bink, Rock HERO, FNP as PCP - General (Family Medicine) Gail Favorite, MD as Consulting Physician  (General Surgery) Lanny Callander, MD as Consulting Physician (Hematology) Shannon Agent, MD as Consulting Physician (Radiation Oncology) Burton, Lacie K, NP as Nurse Practitioner (Nurse Practitioner)   Outpatient Medications Prior to Visit  Medication Sig   cyclobenzaprine  (FLEXERIL ) 5 MG tablet Take 1 tablet (5 mg total) by mouth 3 (three) times daily as needed for muscle  spasms.   fluticasone  (FLONASE ) 50 MCG/ACT nasal spray SPRAY 2 SPRAYS INTO EACH NOSTRIL EVERY DAY   levocetirizine (XYZAL ) 5 MG tablet Take 1 tablet (5 mg total) by mouth every evening.   Nasal Dilators (NASAL STRIPS) STRP 1 strip by Does not apply route at bedtime. Breathing strips   polyethylene glycol powder (GLYCOLAX /MIRALAX ) 17 GM/SCOOP powder Take 17 g by mouth daily.   Vitamin D , Ergocalciferol , (DRISDOL ) 1.25 MG (50000 UNIT) CAPS capsule TAKE 1 CAPSULE (50,000 UNITS TOTAL) BY MOUTH EVERY 7 (SEVEN) DAYS   [DISCONTINUED] esomeprazole  (NEXIUM ) 40 MG capsule TAKE 1 CAPSULE (40 MG TOTAL) BY MOUTH DAILY.   [DISCONTINUED] FLUoxetine  (PROZAC ) 10 MG capsule TAKE 1 CAPSULE BY MOUTH EVERY DAY   [DISCONTINUED] levothyroxine  (SYNTHROID ) 25 MCG tablet TAKE 1 TABLET BY MOUTH DAILY BEFORE BREAKFAST.   No facility-administered medications prior to visit.    Review of Systems  Musculoskeletal:  Positive for back pain, joint pain, myalgias and neck pain. Negative for falls.  Psychiatric/Behavioral:  The patient is nervous/anxious.   All other systems reviewed and are negative.         Objective:     BP 129/82   Pulse 69   Temp 97.7 F (36.5 C)   Ht 5' 4 (1.626 m)   Wt 149 lb 3.2 oz (67.7 kg)   LMP 03/12/2018 Comment: going thru change  SpO2 98%   BMI 25.61 kg/m  BP Readings from Last 3 Encounters:  06/18/24 129/82  12/12/23 128/65  11/10/23 134/77   Wt Readings from Last 3 Encounters:  06/18/24 149 lb 3.2 oz (67.7 kg)  12/12/23 150 lb 3.2 oz (68.1 kg)  11/10/23 150 lb (68 kg)   SpO2 Readings from Last 3  Encounters:  06/18/24 98%  12/12/23 99%  11/10/23 98%      Physical Exam Vitals and nursing note reviewed.  Constitutional:      General: She is not in acute distress.    Appearance: Normal appearance. She is well-developed and well-groomed. She is not ill-appearing, toxic-appearing or diaphoretic.  HENT:     Head: Normocephalic and atraumatic.     Jaw: There is normal jaw occlusion.     Right Ear: Hearing, tympanic membrane, ear canal and external ear normal.     Left Ear: Hearing, tympanic membrane, ear canal and external ear normal.     Nose: Nose normal.     Mouth/Throat:     Lips: Pink.     Mouth: Mucous membranes are moist.     Pharynx: Oropharynx is clear. Uvula midline.  Eyes:     General: Lids are normal.     Extraocular Movements: Extraocular movements intact.     Conjunctiva/sclera: Conjunctivae normal.     Pupils: Pupils are equal, round, and reactive to light.  Neck:     Thyroid : No thyroid  mass, thyromegaly or thyroid  tenderness.     Vascular: No carotid bruit or JVD.     Trachea: Trachea and phonation normal.  Cardiovascular:     Rate and Rhythm: Normal rate and regular rhythm.     Chest Wall: PMI is not displaced.     Pulses: Normal pulses.     Heart sounds: Normal heart sounds. No murmur heard.    No friction rub. No gallop.  Pulmonary:     Effort: Pulmonary effort is normal. No respiratory distress.     Breath sounds: Normal breath sounds. No wheezing.  Abdominal:     General: Bowel sounds are normal. There is no  distension or abdominal bruit.     Palpations: Abdomen is soft. There is no hepatomegaly or splenomegaly.     Tenderness: There is no abdominal tenderness. There is no right CVA tenderness or left CVA tenderness.     Hernia: No hernia is present.  Musculoskeletal:        General: Normal range of motion.     Cervical back: Normal range of motion and neck supple.     Right lower leg: No edema.     Left lower leg: No edema.  Lymphadenopathy:      Cervical: No cervical adenopathy.  Skin:    General: Skin is warm and dry.     Capillary Refill: Capillary refill takes less than 2 seconds.     Coloration: Skin is not cyanotic, jaundiced or pale.     Findings: No rash.  Neurological:     General: No focal deficit present.     Mental Status: She is alert and oriented to person, place, and time.     Sensory: Sensation is intact.     Motor: Motor function is intact.     Coordination: Coordination is intact.     Gait: Gait is intact.     Deep Tendon Reflexes: Reflexes are normal and symmetric.  Psychiatric:        Attention and Perception: Attention and perception normal.        Mood and Affect: Mood and affect normal.        Speech: Speech normal.        Behavior: Behavior normal. Behavior is cooperative.        Thought Content: Thought content normal.        Cognition and Memory: Cognition and memory normal.        Judgment: Judgment normal.       Last CBC Lab Results  Component Value Date   WBC 4.5 12/12/2023   HGB 12.7 12/12/2023   HCT 38.2 12/12/2023   MCV 95 12/12/2023   MCH 31.5 12/12/2023   RDW 11.5 (L) 12/12/2023   PLT 286 12/12/2023   Last metabolic panel Lab Results  Component Value Date   GLUCOSE 90 12/12/2023   NA 137 12/12/2023   K 4.2 12/12/2023   CL 99 12/12/2023   CO2 21 12/12/2023   BUN 8 12/12/2023   CREATININE 0.79 12/12/2023   EGFR 85 12/12/2023   CALCIUM 8.9 12/12/2023   PROT 6.0 12/12/2023   ALBUMIN 4.1 12/12/2023   LABGLOB 1.9 12/12/2023   AGRATIO 2.1 12/06/2022   BILITOT 0.2 12/12/2023   ALKPHOS 66 12/12/2023   AST 26 12/12/2023   ALT 18 12/12/2023   ANIONGAP 5 11/10/2023   Last lipids Lab Results  Component Value Date   CHOL 216 (H) 12/12/2023   HDL 77 12/12/2023   LDLCALC 126 (H) 12/12/2023   TRIG 72 12/12/2023   CHOLHDL 2.8 12/12/2023    Last thyroid  functions Lab Results  Component Value Date   TSH 1.950 12/12/2023   T4TOTAL 10.3 12/12/2023   Last vitamin  D Lab Results  Component Value Date   VD25OH 64.7 12/12/2023   Last vitamin B12 and Folate Lab Results  Component Value Date   VITAMINB12 554 12/06/2022   FOLATE >20.0 12/06/2022        Assessment & Plan:    Routine Health Maintenance and Physical Exam  Immunization History  Administered Date(s) Administered   Influenza Inj Mdck Quad Pf 10/23/2018   Influenza, Seasonal, Injecte, Preservative Fre 09/25/2014  Influenza,inj,Quad PF,6+ Mos 09/25/2014, 08/23/2016, 11/03/2017, 10/23/2018   Influenza,inj,quad, With Preservative 10/06/2015, 10/23/2018   Influenza-Unspecified 08/07/2014, 10/06/2015, 08/07/2016    Health Maintenance  Topic Date Due   COVID-19 Vaccine (1) 07/04/2024 (Originally 08/10/1966)   Zoster Vaccines- Shingrix (1 of 2) 09/18/2024 (Originally 08/09/1981)   DTaP/Tdap/Td (1 - Tdap) 12/11/2024 (Originally 08/09/1981)   Hepatitis C Screening  12/11/2024 (Originally 08/09/1980)   HIV Screening  12/11/2024 (Originally 08/09/1977)   INFLUENZA VACCINE  02/04/2025 (Originally 06/07/2024)   Pneumococcal Vaccine: 50+ Years (1 of 1 - PCV) 06/18/2025 (Originally 08/09/2012)   Cervical Cancer Screening (HPV/Pap Cotest)  08/20/2024   MAMMOGRAM  07/31/2025   Colonoscopy  04/24/2033   Hepatitis B Vaccines  Aged Out   HPV VACCINES  Aged Out   Meningococcal B Vaccine  Aged Out    Discussed health benefits of physical activity, and encouraged her to engage in regular exercise appropriate for her age and condition.  Problem List Items Addressed This Visit       Digestive   Gastroesophageal reflux disease without esophagitis   Relevant Medications   esomeprazole  (NEXIUM ) 40 MG capsule   Other Relevant Orders   CBC with Differential/Platelet     Endocrine   Acquired hypothyroidism   Relevant Medications   levothyroxine  (SYNTHROID ) 25 MCG tablet   Other Relevant Orders   Thyroid  Panel With TSH     Musculoskeletal and Integument   Polyarticular osteoarthritis   Relevant  Orders   Ambulatory referral to Rheumatology   ANA,IFA RA Diag Pnl w/rflx Tit/Patn   Anti-CCP Ab, IgG + IgA (RDL)   Sedimentation Rate     Other   GAD (generalized anxiety disorder)   Relevant Medications   FLUoxetine  (PROZAC ) 10 MG capsule   Other Relevant Orders   Thyroid  Panel With TSH   Vitamin D  deficiency   Relevant Orders   CMP14+EGFR   Vitamin D , 25-hydroxy   Chronic neck pain   Relevant Medications   FLUoxetine  (PROZAC ) 10 MG capsule   Other Relevant Orders   Ambulatory referral to Rheumatology   ANA,IFA RA Diag Pnl w/rflx Tit/Patn   Anti-CCP Ab, IgG + IgA (RDL)   Sedimentation Rate   Pure hypercholesterolemia   Relevant Orders   CMP14+EGFR   Lipid panel   Other Visit Diagnoses       Annual physical exam    -  Primary   Relevant Orders   CBC with Differential/Platelet   CMP14+EGFR   Lipid panel      Return in about 1 year (around 06/18/2025), or if symptoms worsen or fail to improve, for Annual Physical.     Rosaline Bruns, FNP

## 2024-06-19 ENCOUNTER — Ambulatory Visit: Payer: Self-pay | Admitting: Family Medicine

## 2024-06-19 LAB — CBC WITH DIFFERENTIAL/PLATELET
Basophils Absolute: 0 x10E3/uL (ref 0.0–0.2)
Basos: 1 %
EOS (ABSOLUTE): 0.2 x10E3/uL (ref 0.0–0.4)
Eos: 4 %
Hematocrit: 41.4 % (ref 34.0–46.6)
Hemoglobin: 13.3 g/dL (ref 11.1–15.9)
Immature Grans (Abs): 0 x10E3/uL (ref 0.0–0.1)
Immature Granulocytes: 0 %
Lymphocytes Absolute: 1.9 x10E3/uL (ref 0.7–3.1)
Lymphs: 37 %
MCH: 30.5 pg (ref 26.6–33.0)
MCHC: 32.1 g/dL (ref 31.5–35.7)
MCV: 95 fL (ref 79–97)
Monocytes Absolute: 0.6 x10E3/uL (ref 0.1–0.9)
Monocytes: 12 %
Neutrophils Absolute: 2.3 x10E3/uL (ref 1.4–7.0)
Neutrophils: 46 %
Platelets: 268 x10E3/uL (ref 150–450)
RBC: 4.36 x10E6/uL (ref 3.77–5.28)
RDW: 13.1 % (ref 11.7–15.4)
WBC: 5 x10E3/uL (ref 3.4–10.8)

## 2024-06-19 LAB — LIPID PANEL
Chol/HDL Ratio: 3.2 ratio (ref 0.0–4.4)
Cholesterol, Total: 232 mg/dL — ABNORMAL HIGH (ref 100–199)
HDL: 73 mg/dL (ref >39)
LDL Chol Calc (NIH): 144 mg/dL — ABNORMAL HIGH (ref 0–99)
Triglycerides: 84 mg/dL (ref 0–149)
VLDL Cholesterol Cal: 15 mg/dL (ref 5–40)

## 2024-06-19 LAB — CMP14+EGFR
ALT: 20 IU/L (ref 0–32)
AST: 22 IU/L (ref 0–40)
Albumin: 4.2 g/dL (ref 3.9–4.9)
Alkaline Phosphatase: 63 IU/L (ref 44–121)
BUN/Creatinine Ratio: 17 (ref 12–28)
BUN: 12 mg/dL (ref 8–27)
Bilirubin Total: 0.2 mg/dL (ref 0.0–1.2)
CO2: 18 mmol/L — ABNORMAL LOW (ref 20–29)
Calcium: 9.7 mg/dL (ref 8.7–10.3)
Chloride: 102 mmol/L (ref 96–106)
Creatinine, Ser: 0.72 mg/dL (ref 0.57–1.00)
Globulin, Total: 2 g/dL (ref 1.5–4.5)
Glucose: 92 mg/dL (ref 70–99)
Potassium: 4.4 mmol/L (ref 3.5–5.2)
Sodium: 136 mmol/L (ref 134–144)
Total Protein: 6.2 g/dL (ref 6.0–8.5)
eGFR: 95 mL/min/1.73 (ref >59)

## 2024-06-19 LAB — THYROID PANEL WITH TSH
Free Thyroxine Index: 2.7 (ref 1.2–4.9)
T3 Uptake Ratio: 29 % (ref 24–39)
T4, Total: 9.4 ug/dL (ref 4.5–12.0)
TSH: 2.4 u[IU]/mL (ref 0.450–4.500)

## 2024-06-19 LAB — ANA,IFA RA DIAG PNL W/RFLX TIT/PATN
ANA Titer 1: POSITIVE — AB
Cyclic Citrullin Peptide Ab: 4 U (ref 0–19)
Rheumatoid fact SerPl-aCnc: <10 [IU]/mL (ref <14.0)

## 2024-06-19 LAB — SEDIMENTATION RATE: Sed Rate: 12 mm/h (ref 0–40)

## 2024-06-19 LAB — FANA STAINING PATTERNS: Speckled Pattern: 1:80 {titer}

## 2024-06-19 LAB — VITAMIN D 25 HYDROXY (VIT D DEFICIENCY, FRACTURES): Vit D, 25-Hydroxy: 56.3 ng/mL (ref 30.0–100.0)

## 2024-06-21 ENCOUNTER — Encounter: Payer: Self-pay | Admitting: Family Medicine

## 2024-06-21 LAB — ANTI-CCP AB, IGG + IGA (RDL): Anti-CCP Ab, IgG + IgA (RDL): <20 U (ref <20)

## 2024-08-01 ENCOUNTER — Ambulatory Visit
Admission: RE | Admit: 2024-08-01 | Discharge: 2024-08-01 | Disposition: A | Discharge status: Home / Self Care | Source: Ambulatory Visit | Attending: Hematology

## 2024-08-01 DIAGNOSIS — C50212 Malignant neoplasm of upper-inner quadrant of left female breast: Secondary | ICD-10-CM

## 2024-08-18 ENCOUNTER — Other Ambulatory Visit: Payer: Self-pay | Admitting: Family Medicine

## 2024-08-18 DIAGNOSIS — E559 Vitamin D deficiency, unspecified: Secondary | ICD-10-CM

## 2024-08-19 NOTE — Telephone Encounter (Signed)
 Last OV 06/18/24. Last RF 06/11/24. Next OV 06/20/25 Vit D 56.3 on 06/18/24

## 2024-09-04 ENCOUNTER — Ambulatory Visit (INDEPENDENT_AMBULATORY_CARE_PROVIDER_SITE_OTHER)

## 2024-09-04 ENCOUNTER — Encounter: Payer: Self-pay | Admitting: Family Medicine

## 2024-09-04 ENCOUNTER — Ambulatory Visit: Admitting: Family Medicine

## 2024-09-04 ENCOUNTER — Ambulatory Visit: Payer: Self-pay | Admitting: Family Medicine

## 2024-09-04 VITALS — BP 135/74 | HR 88 | Temp 97.8°F | Ht 64.0 in | Wt 153.8 lb

## 2024-09-04 DIAGNOSIS — R059 Cough, unspecified: Secondary | ICD-10-CM

## 2024-09-04 DIAGNOSIS — J189 Pneumonia, unspecified organism: Secondary | ICD-10-CM

## 2024-09-04 DIAGNOSIS — M1991 Primary osteoarthritis, unspecified site: Secondary | ICD-10-CM | POA: Insufficient documentation

## 2024-09-04 DIAGNOSIS — R0609 Other forms of dyspnea: Secondary | ICD-10-CM

## 2024-09-04 DIAGNOSIS — T733XXA Exhaustion due to excessive exertion, initial encounter: Secondary | ICD-10-CM

## 2024-09-04 DIAGNOSIS — M5030 Other cervical disc degeneration, unspecified cervical region: Secondary | ICD-10-CM | POA: Insufficient documentation

## 2024-09-04 MED ORDER — ALBUTEROL SULFATE HFA 108 (90 BASE) MCG/ACT IN AERS: 2.0000 | INHALATION_SPRAY | Freq: Four times a day (QID) | RESPIRATORY_TRACT | 0 refills | Status: AC | PRN

## 2024-09-04 MED ORDER — AZITHROMYCIN 250 MG PO TABS: ORAL_TABLET | ORAL | 0 refills | Status: AC

## 2024-09-04 MED ORDER — PREDNISONE 10 MG (21) PO TBPK: ORAL_TABLET | ORAL | 0 refills | Status: DC

## 2024-09-04 NOTE — Progress Notes (Signed)
 Subjective:  Patient ID: Dawn Rogers, female    DOB: [birth year], 63 y.o.   MRN: [id number]  Patient Care Team: Severa Rock HERO, FNP as PCP - General (Family Medicine) Gail Favorite, MD as Consulting Physician (General Surgery) Lanny Callander, MD as Consulting Physician (Hematology) Shannon Agent, MD as Consulting Physician (Radiation Oncology) Burton, Lacie K, NP as Nurse Practitioner (Nurse Practitioner)   Chief Complaint:  Shortness of Breath and Cough (X 3-4 weeks )   HPI: Dawn Rogers is a 63 y.o. female presenting on 09/04/2024 for Shortness of Breath and Cough (X 3-4 weeks )   Dawn Rogers is a 63 year old female who presents with fatigue, cough, and shortness of breath.  Constitutional symptoms - Fatigue persisting for three weeks, particularly noticeable during physical exertion such as walking or climbing inclines - Generalized achiness, headache, and fever occurred three weeks ago and lasted two to three days - Describes feeling 'wiped out' and 'nervous' during episodes of exertion  Respiratory symptoms - Cough present for at least three weeks, exacerbated by deep breaths - Cough is primarily dry, with occasional production of small amounts of thick mucus - Mucus production attributed to postnasal drip, especially at night - Requires water or candy to prevent coughing when talking - Shortness of breath present, particularly with exertion - No recent chest imaging since last chest X-ray in 2022  Allergic symptoms - Postnasal drip, especially at night - Resumed Xyzal  at night, which has somewhat alleviated postnasal drip symptoms  Oncologic concerns - History of breast cancer - Concern about current symptoms due to family history of breast cancer with metastasis to the lungs          Relevant past medical, surgical, family, and social history reviewed and updated as indicated.  Allergies and medications reviewed and updated. Data reviewed: Chart in  Epic.   Past Medical History:  Diagnosis Date   Anxiety    Arthritis    Cancer (HCC) 08/02/2018   left breast cancer   Family history of breast cancer    Family history of esophageal cancer    Family history of lymphoma    Family history of ovarian cancer    GERD (gastroesophageal reflux disease)    Hypothyroidism    Neck pain    Personal history of radiation therapy     Past Surgical History:  Procedure Laterality Date   ANKLE FRACTURE SURGERY Left    BREAST LUMPECTOMY Left 08/2018   BREAST LUMPECTOMY WITH RADIOACTIVE SEED AND SENTINEL LYMPH NODE BIOPSY Left 08/31/2018   Procedure: LEFT DOUBLE BRACKETED LUMPECTOMY WITH RADIOACTIVE SEED X'S 2, LEFT SENTINEL LYMPH NODE BIOPSY, INJECT BLUE DYE LEFT BREAST;  Surgeon: Gail Favorite, MD;  Location: [facility];  Service: General;  Laterality: Left;   CHOLECYSTECTOMY     TUBAL LIGATION      Social History   Socioeconomic History   Marital status: Married    Spouse name: Not on file   Number of children: Not on file   Years of education: Not on file   Highest education level: Associate degree: occupational, scientist, product/process development, or vocational program  Occupational History   Not on file  Tobacco Use   Smoking status: Never   Smokeless tobacco: Never  Substance and Sexual Activity   Alcohol use: Never   Drug use: Never   Sexual activity: Not on file  Other Topics Concern   Not on file  Social History Narrative   Not on  file   Social Drivers of Health   Financial Resource Strain: Low Risk  (09/03/2024)   Overall Financial Resource Strain (CARDIA)    Difficulty of Paying Living Expenses: Not hard at all  Food Insecurity: No Food Insecurity (09/03/2024)   Hunger Vital Sign    Worried About Running Out of Food in the Last Year: Never true    Ran Out of Food in the Last Year: Never true  Transportation Needs: No Transportation Needs (09/03/2024)   PRAPARE - Administrator, Civil Service (Medical): No     Lack of Transportation (Non-Medical): No  Physical Activity: Inactive (12/11/2023)   Exercise Vital Sign    Days of Exercise per Week: 0 days    Minutes of Exercise per Session: 30 min  Stress: No Stress Concern Present (09/03/2024)   [employer] of Occupational Health - Occupational Stress Questionnaire    Feeling of Stress: Only a little  Social Connections: Socially Integrated (09/03/2024)   Social Connection and Isolation Panel    Frequency of Communication with Friends and Family: More than three times a week    Frequency of Social Gatherings with Friends and Family: Once a week    Attends Religious Services: More than 4 times per year    Active Member of [employer] or Organizations: Yes    Attends Banker Meetings: More than 4 times per year    Marital Status: Married  Catering Manager Violence: Unknown (02/09/2022)   Received from [facility]   HITS    Physically Hurt: Not on file    Insult or Talk Down To: Not on file    Threaten Physical Harm: Not on file    Scream or Curse: Not on file    Outpatient Encounter Medications as of 09/04/2024  Medication Sig   albuterol (VENTOLIN HFA) 108 (90 Base) MCG/ACT inhaler Inhale 2 puffs into the lungs every 6 (six) hours as needed for wheezing or shortness of breath.   azithromycin (ZITHROMAX) 250 MG tablet Take 2 tablets on day 1, then 1 tablet daily on days 2 through 5   cyclobenzaprine  (FLEXERIL ) 5 MG tablet Take 1 tablet (5 mg total) by mouth 3 (three) times daily as needed for muscle spasms.   esomeprazole  (NEXIUM ) 40 MG capsule Take 1 capsule (40 mg total) by mouth daily.   FLUoxetine  (PROZAC ) 10 MG capsule Take 1 capsule (10 mg total) by mouth daily.   fluticasone  (FLONASE ) 50 MCG/ACT nasal spray SPRAY 2 SPRAYS INTO EACH NOSTRIL EVERY DAY   levocetirizine (XYZAL ) 5 MG tablet Take 1 tablet (5 mg total) by mouth every evening.   levothyroxine  (SYNTHROID ) 25 MCG tablet Take 1 tablet (25 mcg total) by mouth daily before  breakfast.   Nasal Dilators (NASAL STRIPS) STRP 1 strip by Does not apply route at bedtime. Breathing strips   polyethylene glycol powder (GLYCOLAX /MIRALAX ) 17 GM/SCOOP powder Take 17 g by mouth daily.   predniSONE  (STERAPRED UNI-PAK 21 TAB) 10 MG (21) TBPK tablet Use as directed on back of pill pack   Vitamin D , Ergocalciferol , (DRISDOL ) 1.25 MG (50000 UNIT) CAPS capsule TAKE 1 CAPSULE (50,000 UNITS TOTAL) BY MOUTH EVERY 7 (SEVEN) DAYS   No facility-administered encounter medications on file as of 09/04/2024.    No Known Allergies  Pertinent ROS per HPI, otherwise unremarkable      Objective:  BP 135/74   Pulse 88   Temp 97.8 F (36.6 C)   Ht 5' 4 (1.626 m)   Wt 153  lb 12.8 oz (69.8 kg)   LMP 03/12/2018 Comment: going thru change  SpO2 97%   BMI 26.40 kg/m    Wt Readings from Last 3 Encounters:  09/04/24 153 lb 12.8 oz (69.8 kg)  06/18/24 149 lb 3.2 oz (67.7 kg)  12/12/23 150 lb 3.2 oz (68.1 kg)    Physical Exam Vitals and nursing note reviewed.  Constitutional:      General: She is not in acute distress.    Appearance: Normal appearance. She is well-developed and well-groomed. She is not ill-appearing, toxic-appearing or diaphoretic.  HENT:     Head: Normocephalic and atraumatic.     Jaw: There is normal jaw occlusion.     Right Ear: Hearing normal.     Left Ear: Hearing normal.     Nose: Nose normal.     Mouth/Throat:     Lips: Pink.     Mouth: Mucous membranes are moist.     Pharynx: Oropharynx is clear. Uvula midline.  Eyes:     General: Lids are normal.     Extraocular Movements: Extraocular movements intact.     Conjunctiva/sclera: Conjunctivae normal.     Pupils: Pupils are equal, round, and reactive to light.  Neck:     Thyroid : No thyroid  mass, thyromegaly or thyroid  tenderness.     Vascular: No carotid bruit or JVD.     Trachea: Trachea and phonation normal.  Cardiovascular:     Rate and Rhythm: Normal rate and regular rhythm.     Chest Wall: PMI  is not displaced.     Pulses: Normal pulses.     Heart sounds: Normal heart sounds. No murmur heard.    No friction rub. No gallop.  Pulmonary:     Effort: Pulmonary effort is normal. No respiratory distress.     Breath sounds: Examination of the right-lower field reveals rhonchi. Rhonchi present. No wheezing or rales.  Abdominal:     General: Bowel sounds are normal. There is no distension or abdominal bruit.     Palpations: Abdomen is soft. There is no hepatomegaly or splenomegaly.     Tenderness: There is no abdominal tenderness. There is no right CVA tenderness or left CVA tenderness.     Hernia: No hernia is present.  Musculoskeletal:        General: Normal range of motion.     Cervical back: Normal range of motion and neck supple.     Right lower leg: No edema.     Left lower leg: No edema.  Lymphadenopathy:     Cervical: No cervical adenopathy.  Skin:    General: Skin is warm and dry.     Capillary Refill: Capillary refill takes less than 2 seconds.     Coloration: Skin is not cyanotic, jaundiced or pale.     Findings: No rash.  Neurological:     General: No focal deficit present.     Mental Status: She is alert and oriented to person, place, and time.     Sensory: Sensation is intact.     Motor: Motor function is intact.     Coordination: Coordination is intact.     Gait: Gait is intact.     Deep Tendon Reflexes: Reflexes are normal and symmetric.  Psychiatric:        Attention and Perception: Attention and perception normal.        Mood and Affect: Mood and affect normal.        Speech: Speech normal.  Behavior: Behavior normal. Behavior is cooperative.        Thought Content: Thought content normal.        Cognition and Memory: Cognition and memory normal.        Judgment: Judgment normal.    EKG: SR 82, PR 164 ms, QT 360 ms. No acute ST-T changes. No significant changes from prior EKG. Rosaline Bruns, FNP-C   Results for orders placed or performed in  visit on 06/18/24  CBC with Differential/Platelet   Collection Time: 06/18/24 10:41 AM  Result Value Ref Range   WBC 5.0 3.4 - 10.8 x10E3/uL   RBC 4.36 3.77 - 5.28 x10E6/uL   Hemoglobin 13.3 11.1 - 15.9 g/dL   Hematocrit 58.5 65.9 - 46.6 %   MCV 95 79 - 97 fL   MCH 30.5 26.6 - 33.0 pg   MCHC 32.1 31.5 - 35.7 g/dL   RDW 86.8 88.2 - 84.5 %   Platelets 268 150 - 450 x10E3/uL   Neutrophils 46 Not Estab. %   Lymphs 37 Not Estab. %   Monocytes 12 Not Estab. %   Eos 4 Not Estab. %   Basos 1 Not Estab. %   Neutrophils Absolute 2.3 1.4 - 7.0 x10E3/uL   Lymphocytes Absolute 1.9 0.7 - 3.1 x10E3/uL   Monocytes Absolute 0.6 0.1 - 0.9 x10E3/uL   EOS (ABSOLUTE) 0.2 0.0 - 0.4 x10E3/uL   Basophils Absolute 0.0 0.0 - 0.2 x10E3/uL   Immature Granulocytes 0 Not Estab. %   Immature Grans (Abs) 0.0 0.0 - 0.1 x10E3/uL  CMP14+EGFR   Collection Time: 06/18/24 10:41 AM  Result Value Ref Range   Glucose 92 70 - 99 mg/dL   BUN 12 8 - 27 mg/dL   Creatinine, Ser 9.27 0.57 - 1.00 mg/dL   eGFR 95 >40 fO/fpw/8.26   BUN/Creatinine Ratio 17 12 - 28   Sodium 136 134 - 144 mmol/L   Potassium 4.4 3.5 - 5.2 mmol/L   Chloride 102 96 - 106 mmol/L   CO2 18 (L) 20 - 29 mmol/L   Calcium 9.7 8.7 - 10.3 mg/dL   Total Protein 6.2 6.0 - 8.5 g/dL   Albumin 4.2 3.9 - 4.9 g/dL   Globulin, Total 2.0 1.5 - 4.5 g/dL   Bilirubin Total 0.2 0.0 - 1.2 mg/dL   Alkaline Phosphatase 63 44 - 121 IU/L   AST 22 0 - 40 IU/L   ALT 20 0 - 32 IU/L  Lipid panel   Collection Time: 06/18/24 10:41 AM  Result Value Ref Range   Cholesterol, Total 232 (H) 100 - 199 mg/dL   Triglycerides 84 0 - 149 mg/dL   HDL 73 >60 mg/dL   VLDL Cholesterol Cal 15 5 - 40 mg/dL   LDL Chol Calc (NIH) 855 (H) 0 - 99 mg/dL   Chol/HDL Ratio 3.2 0.0 - 4.4 ratio  Thyroid  Panel With TSH   Collection Time: 06/18/24 10:41 AM  Result Value Ref Range   TSH 2.400 0.450 - 4.500 uIU/mL   T4, Total 9.4 4.5 - 12.0 ug/dL   T3 Uptake Ratio 29 24 - 39 %   Free  Thyroxine Index 2.7 1.2 - 4.9  Vitamin D , 25-hydroxy   Collection Time: 06/18/24 10:41 AM  Result Value Ref Range   Vit D, 25-Hydroxy 56.3 30.0 - 100.0 ng/mL  ANA,IFA RA Diag Pnl w/rflx Tit/Patn   Collection Time: 06/18/24 10:41 AM  Result Value Ref Range   ANA Titer 1 Positive (A)    Rheumatoid  fact SerPl-aCnc <10.0 <14.0 IU/mL   Cyclic Citrullin Peptide Ab 4 0 - 19 units  Sedimentation Rate   Collection Time: 06/18/24 10:41 AM  Result Value Ref Range   Sed Rate 12 0 - 40 mm/hr  FANA Staining Patterns   Collection Time: 06/18/24 10:41 AM  Result Value Ref Range   Speckled Pattern 1:80    Note: Comment   Anti-CCP Ab, IgG + IgA (RDL)   Collection Time: 06/18/24 10:48 AM  Result Value Ref Range   Anti-CCP Ab, IgG + IgA (RDL) <20 <20 Units       Pertinent labs & imaging results that were available during my care of the patient were reviewed by me and considered in my medical decision making.  Assessment & Plan:  Dawn Rogers was seen today for shortness of breath and cough.  Diagnoses and all orders for this visit:  Cough in adult -     DG Chest 2 View; Future  Community acquired pneumonia of right lower lobe of lung -     azithromycin (ZITHROMAX) 250 MG tablet; Take 2 tablets on day 1, then 1 tablet daily on days 2 through 5 -     predniSONE  (STERAPRED UNI-PAK 21 TAB) 10 MG (21) TBPK tablet; Use as directed on back of pill pack -     albuterol (VENTOLIN HFA) 108 (90 Base) MCG/ACT inhaler; Inhale 2 puffs into the lungs every 6 (six) hours as needed for wheezing or shortness of breath.  Dyspnea on exertion -     ECHOCARDIOGRAM COMPLETE; Future -     Exercise Tolerance Test; Future -     EKG 12-Lead  Fatigue due to excessive exertion, initial encounter -     ECHOCARDIOGRAM COMPLETE; Future -     Exercise Tolerance Test; Future -     EKG 12-Lead        Right lower lobe pneumonia with cough and exertional fatigue Presents with symptoms consistent with right lower lobe  pneumonia, including cough, exertional fatigue, and shortness of breath. Reports a three-week history of cough, particularly with deep breaths, and exertional fatigue, especially on inclines. Chest x-ray shows increased consolidation in the right lower lobe, raising suspicion for pneumonia. Differential diagnosis includes post-viral pneumonia, possibly post-COVID, given the prolonged cough and exertional symptoms. Exertional fatigue and shortness of breath warrant further cardiac evaluation to rule out underlying cardiac issues. - Ordered echocardiogram to assess heart function and structure. - Ordered stress test to evaluate cardiac response to exertion. - Prescribed azithromycin for pneumonia. - Prescribed prednisone  to reduce bronchial inflammation. - Prescribed albuterol inhaler for cough, congestion, and wheezing, to be used every six hours as needed. - Performed EKG before discharge. - Scheduled stress test and echocardiogram at [facility]. - Sent prescriptions to pharmacy.          Continue all other maintenance medications.  Follow up plan: Return if symptoms worsen or fail to improve.   Continue healthy lifestyle choices, including diet (rich in fruits, vegetables, and lean proteins, and low in salt and simple carbohydrates) and exercise (at least 30 minutes of moderate physical activity daily).  Educational handout given for CAP  The above assessment and management plan was discussed with the patient. The patient verbalized understanding of and has agreed to the management plan. Patient is aware to call the clinic if they develop any new symptoms or if symptoms persist or worsen. Patient is aware when to return to the clinic for a follow-up visit. Patient educated on when it  is appropriate to go to the emergency department.   Rosaline Bruns, FNP-C [facility] [phone]

## 2024-09-05 NOTE — Addendum Note (Signed)
 Addended by: SEVERA ROCK HERO on: 09/05/2024 04:27 PM   Modules accepted: Orders

## 2024-09-06 ENCOUNTER — Encounter: Payer: Self-pay | Admitting: Family Medicine

## 2024-09-06 DIAGNOSIS — R0609 Other forms of dyspnea: Secondary | ICD-10-CM

## 2024-09-06 DIAGNOSIS — R9389 Abnormal findings on diagnostic imaging of other specified body structures: Secondary | ICD-10-CM

## 2024-09-12 ENCOUNTER — Encounter: Payer: Self-pay | Admitting: Hematology

## 2024-09-12 NOTE — Telephone Encounter (Signed)
 Dawn Rogers, can you confirm that referral has been placed? I don't see it on my end.

## 2024-09-17 ENCOUNTER — Ambulatory Visit (HOSPITAL_COMMUNITY)

## 2024-09-21 ENCOUNTER — Other Ambulatory Visit: Payer: Self-pay | Admitting: *Deleted

## 2024-09-21 DIAGNOSIS — E559 Vitamin D deficiency, unspecified: Secondary | ICD-10-CM

## 2024-09-23 ENCOUNTER — Other Ambulatory Visit: Payer: Self-pay

## 2024-09-23 DIAGNOSIS — C50212 Malignant neoplasm of upper-inner quadrant of left female breast: Secondary | ICD-10-CM

## 2024-09-23 NOTE — Assessment & Plan Note (Signed)
 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 Grade I -diagnosed 07/2018, s/p left breast lumpectomy and adjuvant radiation. Due to small tumor size, oncotype was not recommended.  -She discontinued tamoxifen  after 10 months due to arthritis. She is on surveillance now -continue annual mammogram  -Ms. Vossler is clinically doing well. Breast exam is benign. Labs are normal. Overall no clinical or radiographic concern for recurrence -continue surveillance

## 2024-09-24 ENCOUNTER — Telehealth: Payer: Self-pay

## 2024-09-24 ENCOUNTER — Inpatient Hospital Stay: Attending: Hematology

## 2024-09-24 ENCOUNTER — Inpatient Hospital Stay: Admitting: Hematology

## 2024-09-24 VITALS — BP 130/77 | HR 73 | Temp 98.0°F | Resp 16 | Ht 64.0 in | Wt 153.5 lb

## 2024-09-24 DIAGNOSIS — D509 Iron deficiency anemia, unspecified: Secondary | ICD-10-CM | POA: Diagnosis not present

## 2024-09-24 DIAGNOSIS — M25551 Pain in right hip: Secondary | ICD-10-CM | POA: Insufficient documentation

## 2024-09-24 DIAGNOSIS — R0609 Other forms of dyspnea: Secondary | ICD-10-CM | POA: Diagnosis not present

## 2024-09-24 DIAGNOSIS — Z923 Personal history of irradiation: Secondary | ICD-10-CM | POA: Diagnosis not present

## 2024-09-24 DIAGNOSIS — C50212 Malignant neoplasm of upper-inner quadrant of left female breast: Secondary | ICD-10-CM | POA: Diagnosis present

## 2024-09-24 DIAGNOSIS — Z170 Estrogen receptor positive status [ER+]: Secondary | ICD-10-CM | POA: Diagnosis not present

## 2024-09-24 DIAGNOSIS — R0789 Other chest pain: Secondary | ICD-10-CM | POA: Insufficient documentation

## 2024-09-24 LAB — CBC WITH DIFFERENTIAL (CANCER CENTER ONLY)
Abs Immature Granulocytes: 0.01 K/uL (ref 0.00–0.07)
Basophils Absolute: 0 K/uL (ref 0.0–0.1)
Basophils Relative: 1 %
Eosinophils Absolute: 0.1 K/uL (ref 0.0–0.5)
Eosinophils Relative: 3 %
HCT: 34.6 % — ABNORMAL LOW (ref 36.0–46.0)
Hemoglobin: 11.3 g/dL — ABNORMAL LOW (ref 12.0–15.0)
Immature Granulocytes: 0 %
Lymphocytes Relative: 30 %
Lymphs Abs: 1.7 K/uL (ref 0.7–4.0)
MCH: 28.5 pg (ref 26.0–34.0)
MCHC: 32.7 g/dL (ref 30.0–36.0)
MCV: 87.4 fL (ref 80.0–100.0)
Monocytes Absolute: 0.6 K/uL (ref 0.1–1.0)
Monocytes Relative: 10 %
Neutro Abs: 3.1 K/uL (ref 1.7–7.7)
Neutrophils Relative %: 56 %
Platelet Count: 277 K/uL (ref 150–400)
RBC: 3.96 MIL/uL (ref 3.87–5.11)
RDW: 13.1 % (ref 11.5–15.5)
WBC Count: 5.5 K/uL (ref 4.0–10.5)
nRBC: 0 % (ref 0.0–0.2)

## 2024-09-24 LAB — FERRITIN: Ferritin: 20 ng/mL (ref 11–307)

## 2024-09-24 LAB — CMP (CANCER CENTER ONLY)
ALT: 21 U/L (ref 0–44)
AST: 26 U/L (ref 15–41)
Albumin: 4 g/dL (ref 3.5–5.0)
Alkaline Phosphatase: 59 U/L (ref 38–126)
Anion gap: 9 (ref 5–15)
BUN: 12 mg/dL (ref 8–23)
CO2: 25 mmol/L (ref 22–32)
Calcium: 9.3 mg/dL (ref 8.9–10.3)
Chloride: 101 mmol/L (ref 98–111)
Creatinine: 0.95 mg/dL (ref 0.44–1.00)
GFR, Estimated: >60 mL/min (ref >60)
Glucose, Bld: 95 mg/dL (ref 70–99)
Potassium: 4.9 mmol/L (ref 3.5–5.1)
Sodium: 135 mmol/L (ref 135–145)
Total Bilirubin: 0.4 mg/dL (ref 0.0–1.2)
Total Protein: 6.5 g/dL (ref 6.5–8.1)

## 2024-09-24 LAB — IRON AND IRON BINDING CAPACITY (CC-WL,HP ONLY)
Iron: 48 ug/dL (ref 28–170)
Saturation Ratios: 12 % (ref 10.4–31.8)
TIBC: 412 ug/dL (ref 250–450)
UIBC: 364 ug/dL

## 2024-09-24 NOTE — Telephone Encounter (Signed)
 6 min walk test results: O2 SAT 97%, HR 83 BPM O2 SAT 98%, HR 96 BPM O2 SAT 97%, HR 103 BPM O2 SAT 95%, HR 105 BPM O2 SAT 98%, HR 107 BPM

## 2024-09-24 NOTE — Progress Notes (Signed)
 [facility]   [phone] [phone]   Clinic Follow up Note   Patient Care Team: Severa Rock HERO, FNP as PCP - General (Family Medicine) Gail Favorite, MD as Consulting Physician (General Surgery) Lanny Callander, MD as Consulting Physician (Hematology) Shannon Agent, MD as Consulting Physician (Radiation Oncology) Burton, Lacie K, NP as Nurse Practitioner (Nurse Practitioner)  Date of Service:  09/24/2024  CHIEF COMPLAINT: f/u of left breast cancer  CURRENT THERAPY:  Cancer surveillance  Oncology History   Malignant neoplasm of upper-inner quadrant of left breast in female, 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 Grade I -diagnosed 07/2018, s/p left breast lumpectomy and adjuvant radiation. Due to small tumor size, oncotype was not recommended.  -She discontinued tamoxifen  after 10 months due to arthritis. She is on surveillance now -continue annual mammogram  -Ms. [name] is clinically doing well. Breast exam is benign. Labs are normal. Overall no clinical or radiographic concern for recurrence -continue surveillance Assessment & Plan History of stage IA left breast cancer, status post surgery and radiation, surveillance for recurrence Stage IA left breast cancer diagnosed in 2019, status post surgery and radiation. Currently on surveillance for recurrence. No high suspicion of recurrence based on current symptoms, but reasonable to rule out due to her chest pain, dyspnea, and right hip pain. - Ordered CT chest, abdomen and pelvis with contrast, and a bone scan to evaluate for recurrence - Ordered circulating tumor DNA test (guardianReveal) to detect any cancer cell signal  Shortness of breath and chest discomfort under evaluation for malignancy or pulmonary etiology Shortness of breath and chest discomfort for 3-4 weeks, initially treated for pneumonia with antibiotics, inhaler, and steroids.  Symptoms improved but not resolved. Differential includes pulmonary etiology or malignancy. No fever or chills. No high suspicion of malignancy based on current symptoms. - Ordered CT chest with contrast to evaluate for pulmonary etiology or malignancy - Monitored oxygen level during office visit  Hip and tailbone pain under evaluation for malignancy or musculoskeletal etiology Chronic hip and tailbone pain for approximately one year, not worsening. Pain is achy and occurs when sitting, lying down, or walking. Differential includes musculoskeletal etiology or malignancy. No high suspicion of malignancy based on current symptoms. - Ordered CT abdomen and pelvis with contrast to evaluate for malignancy or musculoskeletal etiology - Ordered bone scan to evaluate for malignancy  Iron deficiency anemia, new onset, under evaluation for etiology New onset iron deficiency anemia with hemoglobin at 11.3. Previous iron studies showed low iron levels. Differential includes nutritional anemia or GI bleeding. No recent blood counts issues. Previous colonoscopy was normal. Possible low iron due to GI bleeding, though small and unnoticed. - Checked iron studies today - Will consider upper endoscopy if iron deficiency persists - Will discuss with GI specialist for further evaluation  Plan - Due to her persistent mild dyspnea on exertion, and intermittent chest pain, new onset of moderate right hip pain, I will obtain CT chest, abdomen pelvis with contrast and bone scan to rule out cancer recurrence.  She is not hypoxic on 6 minutes walking in the office. - Will obtain guardianReveal in next week  -phone visit after the above test results are back.   SUMMARY OF ONCOLOGIC HISTORY: Oncology History Overview Note  Cancer Staging Malignant neoplasm of upper-inner quadrant of left breast in female, estrogen receptor positive (HCC) Staging form: Breast, AJCC 8th Edition - Clinical stage from 07/23/2018: Stage IA  (  cT1b, cN0, cM0, G1, ER+, PR+, HER2-) - Signed by Lanny Callander, MD on 08/01/2018 - Pathologic stage from 08/31/2018: Stage IA (pT1b, pN0, cM0, G1, ER+, PR+, HER2-) - Signed by Lanny Callander, MD on 11/26/2018     Malignant neoplasm of upper-inner quadrant of left breast in female, estrogen receptor positive (HCC)  07/13/2018 Mammogram   07/13/2018 Screening Mammogram IMPRESSION: Further evaluation is suggested for possible distortion in the left breast   07/19/2018 Breast US     07/19/2018 Breast US  IMPRESSION: 0.6 cm irregular mass with associated architectural distortion in the 9:30 position of the left breast 4 cm from the nipple. Findings are suspicious for malignancy.   Negative for left axillary lymphadenopathy   07/19/2018 Mammogram   07/19/2018 Diagnostic Mammogram IMPRESSION: 0.6 cm irregular mass with associated architectural distortion in the 9:30 position of the left breast 4 cm from the nipple. Findings are suspicious for malignancy.   Negative for left axillary lymphadenopathy.   07/23/2018 Cancer Staging   Staging form: Breast, AJCC 8th Edition - Clinical stage from 07/23/2018: Stage IA (cT1b, cN0, cM0, G1, ER+, PR+, HER2-) - Signed by Lanny Callander, MD on 08/01/2018   07/23/2018 Receptors her2   ADDITIONAL INFORMATION: PROGNOSTIC INDICATORS Results: IMMUNOHISTOCHEMICAL AND MORPHOMETRIC ANALYSIS PERFORMED MANUALLY The tumor cells are Negative for Her2 (1+). Estrogen Receptor: 90%, POSITIVE, STRONG STAINING INTENSITY Progesterone Receptor: 90%, POSITIVE, STRONG STAINING INTENSITY Proliferation Marker Ki67: 10%   07/23/2018 Pathology Results   Diagnosis Breast, left, needle core biopsy, 9:30 o'clock, 4 cm fn - INVASIVE DUCTAL CARCINOMA, GRADE I. SEE NOTE.   07/27/2018 Initial Diagnosis   Malignant neoplasm of upper-inner quadrant of left breast in female, estrogen receptor positive (HCC)   08/16/2018 Genetic Testing   The Multi-Cancer Panel offered by [facility] includes sequencing  and/or deletion duplication testing of the following 91 genes: AIP, ALK, APC, ATM, AXIN2, BAP1, BARD1, BLM, BMPR1A, BRCA1, BRCA2, BRIP1, BUB1B, CASR, CDC73, CDH1, CDK4, CDKN1B, CDKN1C, CDKN2A, CEBPA, CEP57, CHEK2, CTNNA1, DICER1, DIS3L2, EGFR, ENG, EPCAM, FH, FLCN, GALNT12, GATA2, GPC3, GREM1, HOXB13, HRAS, KIT, MAX, MEN1, MET, MITF, MLH1, MLH3, MSH2, MSH3, MSH6, MUTYH, NBN, NF1, NF2, NTHL1, PALB2, PDGFRA, PHOX2B, PMS2, POLD1, POLE, POT1, PRKAR1A, PTCH1, PTEN, RAD50, RAD51C, RAD51D, RB1, RECQL4, RET, RNF43, RPS20, RUNX1, SDHA, SDHAF2, SDHB, SDHC, SDHD, SMAD4, SMARCA4, SMARCB1, SMARCE1, STK11, SUFU, TERC, TERT, TMEM127, TP53, TSC1, TSC2, VHL, WRN, WT1  Results: Negative, no pathogenic variants identified.  The date of this test report is 08/16/2018.    08/31/2018 Surgery   LEFT DOUBLE BRACKETED LUMPECTOMY WITH RADIOACTIVE SEED X'S 2, LEFT SENTINEL LYMPH NODE BIOPSY, INJECT BLUE DYE LEFT BREAST by Dr. Gail 08/31/18   08/31/2018 Pathology Results   Diagnosis 08/31/18  1. Breast, lumpectomy, left - INVASIVE DUCTAL CARCINOMA, [place] GRADE 1 OF 3, 0.7 CM - CALCIFICATIONS ASSOCIATED WITH CARCINOMA - MARGINS UNINVOLVED BY CARCINOMA (0.5 CM; POSTERIOR MARGIN) - COLUMNAR CELL AND FIBROCYSTIC CHANGES INCLUDING APOCRINE METAPLASIA - PSEUDOANGIOMATOUS STROMAL HYPERPLASIA - PREVIOUS BIOPSY SITE CHANGES PRESENT - SEE ONCOLOGY TABLE AND COMMENT BELOW 2. Lymph node, sentinel, biopsy, left axillary #1 - NO CARCINOMA IDENTIFIED IN ONE LYMPH NODE (0/1) 3. Lymph node, sentinel, biopsy, left axillary #2 - NO CARCINOMA IDENTIFIED IN ONE LYMPH NODE (0/1) 4. Lymph node, sentinel, biopsy, left axillary #3 - NO CARCINOMA IDENTIFIED IN ONE LYMPH NODE (0/1)   08/31/2018 Cancer Staging   Staging form: Breast, AJCC 8th Edition - Pathologic stage from 08/31/2018: Stage IA (pT1b, pN0, cM0, G1, ER+, PR+, HER2-) - Signed by Lanny Callander, MD  on 11/26/2018   10/30/2018 - 11/28/2018 Radiation Therapy   Adjuvant Radiation  with Dr. Brigid 10/30/18-11/28/18   11/2018 - 09/2019 Anti-estrogen oral therapy   Tamoxifen  20mg  daily started 11/2018. Stopped in 09/2019 due to arthritis. She opted to not restart.    06/06/2019 Survivorship   Per Mayme Silversmith, NP        Discussed the use of AI scribe software for clinical note transcription with the patient, who gave verbal consent to proceed.  History of Present Illness Dawn Rogers is a 63 year old female with a history of breast cancer who presents with pulmonary issues and concerns about recurrence.  She has experienced persistent chest discomfort and shortness of breath at rest and with exertion for the past three to four weeks. These symptoms have partially improved with antibiotics, an inhaler, and steroids but have not completely resolved. She is concerned about cancer recurrence, especially given her sister's history of breast cancer metastasizing to the lungs. She has a dry cough but no fever, chills, or productive cough.  Her breast cancer was diagnosed in 2019 as stage 1A, treated with radiation therapy, and she completed a ten-year course of tamoxifen . She reports soreness in the area of her previous breast surgery.  She experiences hip and tailbone pain that has persisted for nearly a year, sometimes waking her at night. She occasionally takes ibuprofen or Tylenol  Arthritis for relief.     All other systems were reviewed with the patient and are negative.  MEDICAL HISTORY:  Past Medical History:  Diagnosis Date   Anxiety    Arthritis    Cancer (HCC) 08/02/2018   left breast cancer   Family history of breast cancer    Family history of esophageal cancer    Family history of lymphoma    Family history of ovarian cancer    GERD (gastroesophageal reflux disease)    Hypothyroidism    Neck pain    Personal history of radiation therapy     SURGICAL HISTORY: Past Surgical History:  Procedure Laterality Date   ANKLE FRACTURE SURGERY Left     BREAST LUMPECTOMY Left 08/2018   BREAST LUMPECTOMY WITH RADIOACTIVE SEED AND SENTINEL LYMPH NODE BIOPSY Left 08/31/2018   Procedure: LEFT DOUBLE BRACKETED LUMPECTOMY WITH RADIOACTIVE SEED X'S 2, LEFT SENTINEL LYMPH NODE BIOPSY, INJECT BLUE DYE LEFT BREAST;  Surgeon: Gail Favorite, MD;  Location: [facility];  Service: General;  Laterality: Left;   CHOLECYSTECTOMY     TUBAL LIGATION      I have reviewed the social history and family history with the patient and they are unchanged from previous note.  ALLERGIES:  has no known allergies.  MEDICATIONS:  Current Outpatient Medications  Medication Sig Dispense Refill   albuterol (VENTOLIN HFA) 108 (90 Base) MCG/ACT inhaler Inhale 2 puffs into the lungs every 6 (six) hours as needed for wheezing or shortness of breath. 8 g 0   cyclobenzaprine  (FLEXERIL ) 5 MG tablet Take 1 tablet (5 mg total) by mouth 3 (three) times daily as needed for muscle spasms. 30 tablet 6   esomeprazole  (NEXIUM ) 40 MG capsule Take 1 capsule (40 mg total) by mouth daily. 90 capsule 1   FLUoxetine  (PROZAC ) 10 MG capsule Take 1 capsule (10 mg total) by mouth daily. 90 capsule 1   fluticasone  (FLONASE ) 50 MCG/ACT nasal spray SPRAY 2 SPRAYS INTO EACH NOSTRIL EVERY DAY 48 mL 2   levocetirizine (XYZAL ) 5 MG tablet Take 1 tablet (5 mg total) by mouth  every evening. 90 tablet 1   levothyroxine  (SYNTHROID ) 25 MCG tablet Take 1 tablet (25 mcg total) by mouth daily before breakfast. 90 tablet 3   Magnesium  Citrate (MAGNESIUM  GUMMIES PO) Take by mouth.     Nasal Dilators (NASAL STRIPS) STRP 1 strip by Does not apply route at bedtime. Breathing strips     Vitamin D , Ergocalciferol , (DRISDOL ) 1.25 MG (50000 UNIT) CAPS capsule TAKE 1 CAPSULE (50,000 UNITS TOTAL) BY MOUTH EVERY 7 (SEVEN) DAYS 12 capsule 0   polyethylene glycol powder (GLYCOLAX /MIRALAX ) 17 GM/SCOOP powder Take 17 g by mouth daily. (Patient not taking: Reported on 09/24/2024) 3350 g 1   No current  facility-administered medications for this visit.    PHYSICAL EXAMINATION: ECOG PERFORMANCE STATUS: 1 - Symptomatic but completely ambulatory  Vitals:   09/24/24 1324  BP: 130/77  Pulse: 73  Resp: 16  Temp: 98 F (36.7 C)  SpO2: 100%   Wt Readings from Last 3 Encounters:  09/24/24 153 lb 8 oz (69.6 kg)  09/04/24 153 lb 12.8 oz (69.8 kg)  06/18/24 149 lb 3.2 oz (67.7 kg)     GENERAL:alert, no distress and comfortable SKIN: skin color, texture, turgor are normal, no rashes or significant lesions EYES: normal, Conjunctiva are pink and non-injected, sclera clear NECK: supple, thyroid  normal size, non-tender, without nodularity LYMPH:  no palpable lymphadenopathy in the cervical, axillary  LUNGS: clear to auscultation and percussion with normal breathing effort HEART: regular rate & rhythm and no murmurs and no lower extremity edema ABDOMEN:abdomen soft, non-tender and normal bowel sounds Musculoskeletal:no cyanosis of digits and no clubbing  NEURO: alert & oriented x 3 with fluent speech, no focal motor/sensory deficits BREAST: Right breast normal, non-tender. Left breast with scar tissue, non-tender. Physical Exam   LABORATORY DATA:  I have reviewed the data as listed    Latest Ref Rng & Units 09/24/2024    1:07 PM 06/18/2024   10:41 AM 12/12/2023   10:49 AM  CBC  WBC 4.0 - 10.5 K/uL 5.5  5.0  4.5   Hemoglobin 12.0 - 15.0 g/dL 88.6  86.6  87.2   Hematocrit 36.0 - 46.0 % 34.6  41.4  38.2   Platelets 150 - 400 K/uL 277  268  286         Latest Ref Rng & Units 09/24/2024    1:07 PM 06/18/2024   10:41 AM 12/12/2023   10:49 AM  CMP  Glucose 70 - 99 mg/dL 95  92  90   BUN 8 - 23 mg/dL 12  12  8    Creatinine 0.44 - 1.00 mg/dL 9.04  9.27  9.20   Sodium 135 - 145 mmol/L 135  136  137   Potassium 3.5 - 5.1 mmol/L 4.9  4.4  4.2   Chloride 98 - 111 mmol/L 101  102  99   CO2 22 - 32 mmol/L 25  18  21    Calcium 8.9 - 10.3 mg/dL 9.3  9.7  8.9   Total Protein 6.5 - 8.1 g/dL 6.5   6.2  6.0   Total Bilirubin 0.0 - 1.2 mg/dL 0.4  0.2  0.2   Alkaline Phos 38 - 126 U/L 59  63  66   AST 15 - 41 U/L 26  22  26    ALT 0 - 44 U/L 21  20  18        RADIOGRAPHIC STUDIES: I have personally reviewed the radiological images as listed and agreed with the findings in the  report. No results found.    Orders Placed This Encounter  Procedures   CT CHEST ABDOMEN PELVIS W CONTRAST    Standing Status:   Future    Expected Date:   10/08/2024    Expiration Date:   09/24/2025    If indicated for the ordered procedure, I authorize the administration of contrast media per Radiology protocol:   Yes    Does the patient have a contrast media/X-ray dye allergy?:   No    Preferred imaging location?:   [facility]    Release to patient:   Immediate    If indicated for the ordered procedure, I authorize the administration of oral contrast media per Radiology protocol:   Yes   NM Bone Scan Whole Body    Standing Status:   Future    Expected Date:   10/08/2024    Expiration Date:   09/24/2025    If indicated for the ordered procedure, I authorize the administration of a radiopharmaceutical per Radiology protocol:   Yes    Preferred imaging location?:   [facility]   All questions were answered. The patient knows to call the clinic with any problems, questions or concerns. No barriers to learning was detected. The total time spent in the appointment was 30 minutes, including review of chart and various tests results, discussions about plan of care and coordination of care plan     Onita Mattock, MD 09/24/2024

## 2024-09-26 ENCOUNTER — Other Ambulatory Visit: Payer: Self-pay

## 2024-09-26 ENCOUNTER — Inpatient Hospital Stay

## 2024-09-26 DIAGNOSIS — Z170 Estrogen receptor positive status [ER+]: Secondary | ICD-10-CM

## 2024-09-26 NOTE — Progress Notes (Signed)
 Verbal order w/readback order from Dr. Lanny for [employer]ant Reveal to be drawn on 10/01/2024. Order placed in EPIC and in [employer] portal.  Guardant Reveal kit and requisition given to [facility] Lab Receptionist.

## 2024-10-01 ENCOUNTER — Inpatient Hospital Stay

## 2024-10-01 ENCOUNTER — Ambulatory Visit (HOSPITAL_COMMUNITY)
Admission: RE | Admit: 2024-10-01 | Discharge: 2024-10-01 | Disposition: A | Discharge status: Home / Self Care | Source: Ambulatory Visit | Attending: Hematology | Admitting: Hematology

## 2024-10-01 DIAGNOSIS — C50212 Malignant neoplasm of upper-inner quadrant of left female breast: Secondary | ICD-10-CM | POA: Diagnosis present

## 2024-10-01 DIAGNOSIS — Z170 Estrogen receptor positive status [ER+]: Secondary | ICD-10-CM | POA: Diagnosis present

## 2024-10-01 MED ORDER — IOHEXOL 300 MG/ML  SOLN
100.0000 mL | Freq: Once | INTRAMUSCULAR | Status: AC | PRN
  Administered 2024-10-01: 100 mL via INTRAVENOUS

## 2024-10-01 MED ORDER — SODIUM CHLORIDE (PF) 0.9 % IJ SOLN
INTRAMUSCULAR | Status: AC
  Filled 2024-10-01: qty 50

## 2024-10-02 ENCOUNTER — Telehealth (HOSPITAL_COMMUNITY): Payer: Self-pay | Admitting: *Deleted

## 2024-10-02 ENCOUNTER — Ambulatory Visit (HOSPITAL_COMMUNITY)
Admission: RE | Admit: 2024-10-02 | Discharge: 2024-10-02 | Disposition: A | Discharge status: Home / Self Care | Source: Ambulatory Visit | Attending: Hematology | Admitting: Hematology

## 2024-10-02 DIAGNOSIS — Z170 Estrogen receptor positive status [ER+]: Secondary | ICD-10-CM | POA: Insufficient documentation

## 2024-10-02 DIAGNOSIS — C50212 Malignant neoplasm of upper-inner quadrant of left female breast: Secondary | ICD-10-CM | POA: Diagnosis present

## 2024-10-02 MED ORDER — TECHNETIUM TC 99M MEDRONATE IV KIT
20.0000 | PACK | Freq: Once | INTRAVENOUS | Status: AC | PRN
  Administered 2024-10-02: 19.7 via INTRAVENOUS

## 2024-10-02 NOTE — Telephone Encounter (Signed)
 Reminder call given for upcoming GXT on 10/11/24 at 9:30.  To be here 7:40 for an echocardiogram at 8:00.

## 2024-10-08 ENCOUNTER — Encounter: Payer: Self-pay | Admitting: Hematology

## 2024-10-09 ENCOUNTER — Encounter: Payer: Self-pay | Admitting: Hematology

## 2024-10-09 ENCOUNTER — Other Ambulatory Visit: Payer: Self-pay

## 2024-10-09 ENCOUNTER — Telehealth: Payer: Self-pay

## 2024-10-09 ENCOUNTER — Inpatient Hospital Stay: Attending: Hematology | Admitting: Hematology

## 2024-10-09 DIAGNOSIS — Z170 Estrogen receptor positive status [ER+]: Secondary | ICD-10-CM | POA: Diagnosis not present

## 2024-10-09 DIAGNOSIS — C50212 Malignant neoplasm of upper-inner quadrant of left female breast: Secondary | ICD-10-CM | POA: Insufficient documentation

## 2024-10-09 NOTE — Progress Notes (Signed)
 Dawn Rogers   [phone] [phone]   Clinic Follow up Note   Patient Care Team: Dawn Rock HERO, FNP as PCP - General (Family Medicine) Dawn Favorite, MD as Consulting Physician (General Surgery) Dawn Callander, MD as Consulting Physician (Hematology) Dawn Agent, MD as Consulting Physician (Radiation Oncology) Burton, Lacie K, NP as Nurse Practitioner (Nurse Practitioner) 10/09/2024  I connected with Dawn Rogers on 10/09/24 at  3:40 PM EST by telephone and verified that I am speaking with the correct person using two identifiers.   I discussed the limitations, risks, security and privacy concerns of performing an evaluation and management service by telephone and the availability of in person appointments. I also discussed with the patient that there may be a patient responsible charge related to this service. The patient expressed understanding and agreed to proceed.   Patient's location:  Home  Provider's location:  Office    CHIEF COMPLAINT: Review scan results   CURRENT THERAPY: Cancer surveillance  Oncology history Malignant neoplasm of upper-inner quadrant of left breast in female, 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 Grade I -diagnosed 07/2018, s/p left breast lumpectomy and adjuvant radiation. Due to small tumor size, oncotype was not recommended.  -She discontinued tamoxifen  after 10 months due to arthritis. She is on surveillance now -continue annual mammogram  -Dawn Rogers is clinically doing well. Breast exam is benign. Labs are normal. Overall no clinical or radiographic concern for recurrence -continue surveillance   Assessment & Plan Pulmonary nodules, right lower lobe and bilateral lungs, under evaluation Due to her recent chest pain and history of breast cancer, she underwent CT scan, which showed multiple bilateral lung nodules identified on CT chest, with the largest  measuring 10 mm in the right lower lobe.  No other evidence of cancer recurrence.  Further evaluation is necessary to determine the nature of these nodules. - Ordered PET scan for further evaluation of lung nodules. - If PET scan is positive, will proceed with biopsy. - If PET scan is negative, will repeat CT in three months.   History of stage I breast cancer, left upper-inner quadrant, on surveillance Stage I breast cancer diagnosed in 2019, currently on surveillance. Recent bone scan was negative for bone metastasis, and blood tests including Guardian review were negative for circulating tumor DNA, indicating low likelihood of cancer recurrence. - Continue surveillance with repeat CT in three months.  Plan - I personally reviewed her CT scan images and discussed the findings with her - Will order PET scan to evaluate her lung nodules, to determine if she needs biopsy - Lab and the bone scan results reviewed with her - I will call her after PET scan.   SUMMARY OF ONCOLOGIC HISTORY: Oncology History Overview Note  Cancer Staging Malignant neoplasm of upper-inner quadrant of left breast in female, estrogen receptor positive (HCC) Staging form: Breast, AJCC 8th Edition - Clinical stage from 07/23/2018: Stage IA (cT1b, cN0, cM0, G1, ER+, PR+, HER2-) - Signed by Dawn Callander, MD on 08/01/2018 - Pathologic stage from 08/31/2018: Stage IA (pT1b, pN0, cM0, G1, ER+, PR+, HER2-) - Signed by Dawn Callander, MD on 11/26/2018     Malignant neoplasm of upper-inner quadrant of left breast in female, estrogen receptor positive (HCC)  07/13/2018 Mammogram   07/13/2018 Screening Mammogram IMPRESSION: Further evaluation is suggested for possible distortion in the left breast   07/19/2018 Breast US     07/19/2018 Breast US  IMPRESSION:  0.6 cm irregular mass with associated architectural distortion in the 9:30 position of the left breast 4 cm from the nipple. Findings are suspicious for malignancy.   Negative  for left axillary lymphadenopathy   07/19/2018 Mammogram   07/19/2018 Diagnostic Mammogram IMPRESSION: 0.6 cm irregular mass with associated architectural distortion in the 9:30 position of the left breast 4 cm from the nipple. Findings are suspicious for malignancy.   Negative for left axillary lymphadenopathy.   07/23/2018 Cancer Staging   Staging form: Breast, AJCC 8th Edition - Clinical stage from 07/23/2018: Stage IA (cT1b, cN0, cM0, G1, ER+, PR+, HER2-) - Signed by Dawn Callander, MD on 08/01/2018   07/23/2018 Receptors her2   ADDITIONAL INFORMATION: PROGNOSTIC INDICATORS Results: IMMUNOHISTOCHEMICAL AND MORPHOMETRIC ANALYSIS PERFORMED MANUALLY The tumor cells are Negative for Her2 (1+). Estrogen Receptor: 90%, POSITIVE, STRONG STAINING INTENSITY Progesterone Receptor: 90%, POSITIVE, STRONG STAINING INTENSITY Proliferation Marker Ki67: 10%   07/23/2018 Pathology Results   Diagnosis Breast, left, needle core biopsy, 9:30 o'clock, 4 cm fn - INVASIVE DUCTAL CARCINOMA, GRADE I. SEE NOTE.   07/27/2018 Initial Diagnosis   Malignant neoplasm of upper-inner quadrant of left breast in female, estrogen receptor positive (HCC)   08/16/2018 Genetic Testing   The Multi-Cancer Panel offered by [facility] includes sequencing and/or deletion duplication testing of the following 91 genes: AIP, ALK, APC, ATM, AXIN2, BAP1, BARD1, BLM, BMPR1A, BRCA1, BRCA2, BRIP1, BUB1B, CASR, CDC73, CDH1, CDK4, CDKN1B, CDKN1C, CDKN2A, CEBPA, CEP57, CHEK2, CTNNA1, DICER1, DIS3L2, EGFR, ENG, EPCAM, FH, FLCN, GALNT12, GATA2, GPC3, GREM1, HOXB13, HRAS, KIT, MAX, MEN1, MET, MITF, MLH1, MLH3, MSH2, MSH3, MSH6, MUTYH, NBN, NF1, NF2, NTHL1, PALB2, PDGFRA, PHOX2B, PMS2, POLD1, POLE, POT1, PRKAR1A, PTCH1, PTEN, RAD50, RAD51C, RAD51D, RB1, RECQL4, RET, RNF43, RPS20, RUNX1, SDHA, SDHAF2, SDHB, SDHC, SDHD, SMAD4, SMARCA4, SMARCB1, SMARCE1, STK11, SUFU, TERC, TERT, TMEM127, TP53, TSC1, TSC2, VHL, WRN, WT1  Results: Negative, no pathogenic  variants identified.  The date of this test report is 08/16/2018.    08/31/2018 Surgery   LEFT DOUBLE BRACKETED LUMPECTOMY WITH RADIOACTIVE SEED X'S 2, LEFT SENTINEL LYMPH NODE BIOPSY, INJECT BLUE DYE LEFT BREAST by Dr. Gail 08/31/18   08/31/2018 Pathology Results   Diagnosis 08/31/18  1. Breast, lumpectomy, left - INVASIVE DUCTAL CARCINOMA, NOTTINGHAM GRADE 1 OF 3, 0.7 CM - CALCIFICATIONS ASSOCIATED WITH CARCINOMA - MARGINS UNINVOLVED BY CARCINOMA (0.5 CM; POSTERIOR MARGIN) - COLUMNAR CELL AND FIBROCYSTIC CHANGES INCLUDING APOCRINE METAPLASIA - PSEUDOANGIOMATOUS STROMAL HYPERPLASIA - PREVIOUS BIOPSY SITE CHANGES PRESENT - SEE ONCOLOGY TABLE AND COMMENT BELOW 2. Lymph node, sentinel, biopsy, left axillary #1 - NO CARCINOMA IDENTIFIED IN ONE LYMPH NODE (0/1) 3. Lymph node, sentinel, biopsy, left axillary #2 - NO CARCINOMA IDENTIFIED IN ONE LYMPH NODE (0/1) 4. Lymph node, sentinel, biopsy, left axillary #3 - NO CARCINOMA IDENTIFIED IN ONE LYMPH NODE (0/1)   08/31/2018 Cancer Staging   Staging form: Breast, AJCC 8th Edition - Pathologic stage from 08/31/2018: Stage IA (pT1b, pN0, cM0, G1, ER+, PR+, HER2-) - Signed by Dawn Callander, MD on 11/26/2018   10/30/2018 - 11/28/2018 Radiation Therapy   Adjuvant Radiation with Dr. Brigid 10/30/18-11/28/18   11/2018 - 09/2019 Anti-estrogen oral therapy   Tamoxifen  20mg  daily started 11/2018. Stopped in 09/2019 due to arthritis. She opted to not restart.    06/06/2019 Survivorship   Per Mayme Silversmith, NP       Discussed the use of AI scribe software for clinical note transcription with the patient, who gave verbal consent to proceed.  History of Present Illness Dawn Rogers is a 63 year old female with stage one breast cancer who presents for a review of CT and bone scan results.  She was diagnosed with stage one breast cancer in 2019 and is under surveillance. Recent bone scan showed no bone metastasis, and blood testing including Guardian  review was negative for circulating tumor DNA. CT chest showed multiple bilateral lung nodules, largest 10 mm in the right lower lobe.     REVIEW OF SYSTEMS:   Constitutional: Denies fevers, chills or abnormal weight loss Eyes: Denies blurriness of vision Ears, nose, mouth, throat, and face: Denies mucositis or sore throat Respiratory: Denies cough, dyspnea or wheezes Cardiovascular: Denies palpitation, chest discomfort or lower extremity swelling Gastrointestinal:  Denies nausea, heartburn or change in bowel habits Skin: Denies abnormal skin rashes Lymphatics: Denies new lymphadenopathy or easy bruising Neurological:Denies numbness, tingling or new weaknesses Behavioral/Psych: Mood is stable, no new changes  All other systems were reviewed with the patient and are negative.  MEDICAL HISTORY:  Past Medical History:  Diagnosis Date   Anxiety    Arthritis    Cancer (HCC) 08/02/2018   left breast cancer   Family history of breast cancer    Family history of esophageal cancer    Family history of lymphoma    Family history of ovarian cancer    GERD (gastroesophageal reflux disease)    Hypothyroidism    Neck pain    Personal history of radiation therapy     SURGICAL HISTORY: Past Surgical History:  Procedure Laterality Date   ANKLE FRACTURE SURGERY Left    BREAST LUMPECTOMY Left 08/2018   BREAST LUMPECTOMY WITH RADIOACTIVE SEED AND SENTINEL LYMPH NODE BIOPSY Left 08/31/2018   Procedure: LEFT DOUBLE BRACKETED LUMPECTOMY WITH RADIOACTIVE SEED X'S 2, LEFT SENTINEL LYMPH NODE BIOPSY, INJECT BLUE DYE LEFT BREAST;  Surgeon: Dawn Favorite, MD;  Location: [facility];  Service: General;  Laterality: Left;   CHOLECYSTECTOMY     TUBAL LIGATION      I have reviewed the social history and family history with the patient and they are unchanged from previous note.  ALLERGIES:  has no known allergies.  MEDICATIONS:  Current Outpatient Medications  Medication Sig  Dispense Refill   albuterol  (VENTOLIN  HFA) 108 (90 Base) MCG/ACT inhaler Inhale 2 puffs into the lungs every 6 (six) hours as needed for wheezing or shortness of breath. 8 g 0   cyclobenzaprine  (FLEXERIL ) 5 MG tablet Take 1 tablet (5 mg total) by mouth 3 (three) times daily as needed for muscle spasms. 30 tablet 6   esomeprazole  (NEXIUM ) 40 MG capsule Take 1 capsule (40 mg total) by mouth daily. 90 capsule 1   FLUoxetine  (PROZAC ) 10 MG capsule Take 1 capsule (10 mg total) by mouth daily. 90 capsule 1   fluticasone  (FLONASE ) 50 MCG/ACT nasal spray SPRAY 2 SPRAYS INTO EACH NOSTRIL EVERY DAY 48 mL 2   levocetirizine (XYZAL ) 5 MG tablet Take 1 tablet (5 mg total) by mouth every evening. 90 tablet 1   levothyroxine  (SYNTHROID ) 25 MCG tablet Take 1 tablet (25 mcg total) by mouth daily before breakfast. 90 tablet 3   Magnesium  Citrate (MAGNESIUM  GUMMIES PO) Take by mouth.     Nasal Dilators (NASAL STRIPS) STRP 1 strip by Does not apply route at bedtime. Breathing strips     polyethylene glycol powder (GLYCOLAX /MIRALAX ) 17 GM/SCOOP powder Take 17 g by mouth daily. (Patient not taking: Reported on 09/24/2024) 3350 g 1   Vitamin D , Ergocalciferol , (DRISDOL )  1.25 MG (50000 UNIT) CAPS capsule TAKE 1 CAPSULE (50,000 UNITS TOTAL) BY MOUTH EVERY 7 (SEVEN) DAYS 12 capsule 0   No current facility-administered medications for this visit.    PHYSICAL EXAMINATION: Not performed   LABORATORY DATA:  I have reviewed the data as listed    Latest Ref Rng & Units 09/24/2024    1:07 PM 06/18/2024   10:41 AM 12/12/2023   10:49 AM  CBC  WBC 4.0 - 10.5 Rogers/uL 5.5  5.0  4.5   Hemoglobin 12.0 - 15.0 g/dL 88.6  86.6  87.2   Hematocrit 36.0 - 46.0 % 34.6  41.4  38.2   Platelets 150 - 400 Rogers/uL 277  268  286         Latest Ref Rng & Units 09/24/2024    1:07 PM 06/18/2024   10:41 AM 12/12/2023   10:49 AM  CMP  Glucose 70 - 99 mg/dL 95  92  90   BUN 8 - 23 mg/dL 12  12  8    Creatinine 0.44 - 1.00 mg/dL 9.04  9.27  9.20    Sodium 135 - 145 mmol/L 135  136  137   Potassium 3.5 - 5.1 mmol/L 4.9  4.4  4.2   Chloride 98 - 111 mmol/L 101  102  99   CO2 22 - 32 mmol/L 25  18  21    Calcium 8.9 - 10.3 mg/dL 9.3  9.7  8.9   Total Protein 6.5 - 8.1 g/dL 6.5  6.2  6.0   Total Bilirubin 0.0 - 1.2 mg/dL 0.4  0.2  0.2   Alkaline Phos 38 - 126 U/L 59  63  66   AST 15 - 41 U/L 26  22  26    ALT 0 - 44 U/L 21  20  18        RADIOGRAPHIC STUDIES: I have personally reviewed the radiological images as listed and agreed with the findings in the report. No results found.     I discussed the assessment and treatment plan with the patient. The patient was provided an opportunity to ask questions and all were answered. The patient agreed with the plan and demonstrated an understanding of the instructions.   The patient was advised to call back or seek an in-person evaluation if the symptoms worsen or if the condition fails to improve as anticipated.  I provided 25 minutes of non face-to-face telephone visit time during this encounter, including review of chart and various tests results, discussions about plan of care and coordination of care plan.    Onita Mattock, MD 10/09/24

## 2024-10-09 NOTE — Telephone Encounter (Signed)
 Pt called wanting to know the results of CT Scan and whole body bone scan results.  Informed pt that the CT Scan and whole body scan has not been read as of today.  Stated this nurse will give [facility] a call to expedite the read on the scans.  Scheduled a telephone visit with Dr Lanny on 10/10/2024 to discuss pt's test results.

## 2024-10-09 NOTE — Assessment & Plan Note (Signed)
 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 Grade I -diagnosed 07/2018, s/p left breast lumpectomy and adjuvant radiation. Due to small tumor size, oncotype was not recommended.  -She discontinued tamoxifen  after 10 months due to arthritis. She is on surveillance now -continue annual mammogram  -Dawn Rogers is clinically doing well. Breast exam is benign. Labs are normal. Overall no clinical or radiographic concern for recurrence -continue surveillance

## 2024-10-10 ENCOUNTER — Telehealth: Payer: Self-pay | Admitting: Hematology

## 2024-10-10 ENCOUNTER — Inpatient Hospital Stay: Admitting: Hematology

## 2024-10-10 LAB — GUARDANT REVEAL

## 2024-10-10 NOTE — Telephone Encounter (Signed)
 Called the pt and confirm time and date.

## 2024-10-11 ENCOUNTER — Ambulatory Visit (HOSPITAL_COMMUNITY)

## 2024-10-21 ENCOUNTER — Encounter (HOSPITAL_COMMUNITY): Admission: RE | Admit: 2024-10-21 | Discharge: 2024-10-21 | Attending: Hematology

## 2024-10-21 DIAGNOSIS — C50212 Malignant neoplasm of upper-inner quadrant of left female breast: Secondary | ICD-10-CM

## 2024-10-21 DIAGNOSIS — Z170 Estrogen receptor positive status [ER+]: Secondary | ICD-10-CM | POA: Insufficient documentation

## 2024-10-21 DIAGNOSIS — R918 Other nonspecific abnormal finding of lung field: Secondary | ICD-10-CM | POA: Diagnosis not present

## 2024-10-21 DIAGNOSIS — I700 Atherosclerosis of aorta: Secondary | ICD-10-CM | POA: Insufficient documentation

## 2024-10-21 LAB — GLUCOSE, CAPILLARY: Glucose-Capillary: 104 mg/dL — ABNORMAL HIGH (ref 70–99)

## 2024-10-21 MED ORDER — FLUDEOXYGLUCOSE F - 18 (FDG) INJECTION
7.6500 | Freq: Once | INTRAVENOUS | Status: AC | PRN
  Administered 2024-10-21: 09:00:00 7.65 via INTRAVENOUS

## 2024-10-23 ENCOUNTER — Other Ambulatory Visit: Payer: Self-pay

## 2024-10-23 NOTE — Assessment & Plan Note (Signed)
 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 Grade I -diagnosed 07/2018, s/p left breast lumpectomy and adjuvant radiation. Due to small tumor size, oncotype was not recommended.  -She discontinued tamoxifen  after 10 months due to arthritis. She is on surveillance now -continue annual mammogram  -continue surveillance

## 2024-10-23 NOTE — Progress Notes (Signed)
 As per Dr. Lanny, called patient to relay results of her Guardant Reveal, results were negative, patient had no further questions at this time and voice full understanding.

## 2024-10-24 ENCOUNTER — Inpatient Hospital Stay: Admitting: Hematology

## 2024-10-24 ENCOUNTER — Encounter: Payer: Self-pay | Admitting: Internal Medicine

## 2024-10-24 ENCOUNTER — Ambulatory Visit: Admitting: Internal Medicine

## 2024-10-24 VITALS — BP 137/83 | HR 73 | Ht 64.0 in | Wt 149.0 lb

## 2024-10-24 DIAGNOSIS — C50212 Malignant neoplasm of upper-inner quadrant of left female breast: Secondary | ICD-10-CM

## 2024-10-24 DIAGNOSIS — R058 Other specified cough: Secondary | ICD-10-CM | POA: Diagnosis not present

## 2024-10-24 DIAGNOSIS — Z170 Estrogen receptor positive status [ER+]: Secondary | ICD-10-CM

## 2024-10-24 DIAGNOSIS — R911 Solitary pulmonary nodule: Secondary | ICD-10-CM | POA: Diagnosis not present

## 2024-10-24 NOTE — Progress Notes (Addendum)
 Dawn Rogers, female    DOB: [birth year]    MRN: [id number]   Brief patient profile:  43  yowf never smoker with L breast ca> lumbectomy/RT no chemo in 2019 [facility] oncologist referred to pulmonary clinic in [place]  10/24/2024 by Rock Bruns NP  for abnormal CT  in setting of chronic doe x inclines x 2-3 years then acutely ill 6 weeks  prior to OV  sob and dry cough with deep breath along with midline cp improved p pred/ alb/abx 50-75%    Pt not previously seen by PCCM service.     History of Present Illness  10/24/2024  Pulmonary/ 1st office eval/ Scotti Kosta / [facility]  Chief Complaint  Patient presents with   Establish Care    Shob deep breathing causing discomfort - Had pet on Monday waiting for results/ had CT w abn results (sister passed from cancer)   Dyspnea:  inclines only  Cough: pnds x years with globus allergy testing [facility] dust / rx flonase  / xyzal  help some Sleep: does fine with breath rites s noct or early am cough/ excess mucus productions SABA use: none  02: none    No obvious day to day or daytime pattern/variability or assoc excess/ purulent sputum or mucus plugs or hemoptysis or chest tightness, subjective wheeze or overt  hb symptoms.    Also denies any obvious fluctuation of symptoms with weather or environmental changes or other aggravating or alleviating factors except as outlined above   No unusual exposure hx or h/o childhood pna/ asthma or knowledge of premature birth.  Current Allergies, Complete Past Medical History, Past Surgical History, Family History, and Social History were reviewed in [employer] record.  ROS  The following are not active complaints unless bolded Hoarseness, sore throat/globus , dysphagia, dental problems, itching, sneezing,  nasal congestion or discharge of excess mucus or purulent secretions, ear ache,   fever, chills, sweats, unintended wt loss or wt gain, classically pleuritic or exertional cp,   orthopnea pnd or arm/hand swelling  or leg swelling, presyncope, palpitations, abdominal pain, anorexia, nausea, vomiting, diarrhea  or change in bowel habits or change in bladder habits, change in stools or change in urine, dysuria, hematuria,  rash, arthralgias, visual complaints, headache, numbness, weakness or ataxia or problems with walking or coordination,  change in mood or  memory.            Outpatient Medications Prior to Visit  Medication Sig Dispense Refill   albuterol  (VENTOLIN  HFA) 108 (90 Base) MCG/ACT inhaler Inhale 2 puffs into the lungs every 6 (six) hours as needed for wheezing or shortness of breath. 8 g 0   cyclobenzaprine  (FLEXERIL ) 5 MG tablet Take 1 tablet (5 mg total) by mouth 3 (three) times daily as needed for muscle spasms. 30 tablet 6   esomeprazole  (NEXIUM ) 40 MG capsule Take 1 capsule (40 mg total) by mouth daily. 90 capsule 1   FLUoxetine  (PROZAC ) 10 MG capsule Take 1 capsule (10 mg total) by mouth daily. 90 capsule 1   fluticasone  (FLONASE ) 50 MCG/ACT nasal spray SPRAY 2 SPRAYS INTO EACH NOSTRIL EVERY DAY 48 mL 2   levocetirizine (XYZAL ) 5 MG tablet Take 1 tablet (5 mg total) by mouth every evening. 90 tablet 1   levothyroxine  (SYNTHROID ) 25 MCG tablet Take 1 tablet (25 mcg total) by mouth daily before breakfast. 90 tablet 3   Magnesium  Citrate (MAGNESIUM  GUMMIES PO) Take by mouth.     Nasal Dilators (NASAL  STRIPS) STRP 1 strip by Does not apply route at bedtime. Breathing strips     Vitamin D , Ergocalciferol , (DRISDOL ) 1.25 MG (50000 UNIT) CAPS capsule TAKE 1 CAPSULE (50,000 UNITS TOTAL) BY MOUTH EVERY 7 (SEVEN) DAYS 12 capsule 0   polyethylene glycol powder (GLYCOLAX /MIRALAX ) 17 GM/SCOOP powder Take 17 g by mouth daily. (Patient not taking: Reported on 10/24/2024) 3350 g 1   No facility-administered medications prior to visit.    Past Medical History:  Diagnosis Date   Anxiety    Arthritis Last year   Cancer (HCC) 08/02/2018   left breast cancer   Family  history of breast cancer    Family history of esophageal cancer    Family history of lymphoma    Family history of ovarian cancer    GERD (gastroesophageal reflux disease)    Hypothyroidism    Neck pain    Neuromuscular disorder (HCC) Around 2010   Had Pinched nerves for several years   Personal history of radiation therapy       Objective:     BP 137/83   Pulse 73   Ht 5' 4 (1.626 m)   Wt 149 lb (67.6 kg)   LMP 03/12/2018 Comment: going thru change  SpO2 100% Comment: ra  BMI 25.58 kg/m   SpO2: 100 % (ra) [facility]   HEENT : Oropharynx  slt erythema pos pharynx chewing mint gum     Nasal turbinates min edema    NECK :  without  apparent JVD/ palpable Nodes/TM    LUNGS: no acc muscle use,  Nl contour chest which is clear to A and P bilaterally without cough on insp or exp maneuvers   CV:  RRR  no s3 or murmur or increase in P2, and no edema   ABD:  soft and nontender   MS:  Gait nl   ext warm without deformities Or obvious joint restrictions  calf tenderness, cyanosis or clubbing    SKIN: warm and dry without lesions    NEURO:  alert, approp, nl sensorium with  no motor or cerebellar deficits apparent.     I personally reviewed images and agree with radiology impression as follows:  PET CT    10/21/24  1. The subpleural predominant pulmonary nodules are primarily below and at the low end of PET resolution. No correlate hypermetabolism identified. These remain indeterminate, and may represent subpleural lymph nodes. Recommend chest CT follow-up at 6 months. 2. No hypermetabolic metastatic disease or primary malignancy identified.      Assessment   Assessment & Plan Upper airway cough syndrome Onset ? How remote with neg allergy w/u by [facility] x for dust - Acutely worse in setting of URI since early Nov 2025 with residual globus - 10/24/2024 rec max gerd rx x 6 weeks then consider refer to Dr Brien at [facility] voice center if persists   Upper  airway cough syndrome (previously labeled PNDS),  is so named because it's frequently impossible to sort out how much is  CR/sinusitis with freq throat clearing (which can be related to primary GERD)   vs  causing  secondary ( extra esophageal)  GERD from wide swings in gastric pressure that occur with throat clearing, often  promoting self use of mint and menthol lozenges that reduce the lower esophageal sphincter tone and exacerbate the problem further in a cyclical fashion.   These are the same pts (now being labeled as having irritable larynx syndrome by some cough centers) who  not infrequently have a history of having failed to tolerate ace inhibitors,  dry powder inhalers or biphosphonates or report having atypical/extraesophageal reflux symptoms from LPR (globus, throat clearing)  that don't respond to standard doses of PPI  and are easily confused as having aecopd or asthma flares by even experienced allergists/ pulmonologists (myself included).   Of the three most common causes of  Sub-acute / recurrent or chronic cough, only one (GERD)  can actually contribute to/ trigger  the other two (asthma and post nasal drip syndrome)  and perpetuate the cylce of cough.  While not intuitively obvious, many patients with chronic low grade reflux do not cough until there is a primary insult that disturbs the protective epithelial barrier and exposes sensitive nerve endings.   This is typically viral but can due to PNDS and  either may apply here.    >>>   The point is that once this occurs, it is difficult to eliminate the cycle  using anything but a maximally effective acid suppression regimen at least in the short run, accompanied by an appropriate diet to address non acid GERD and control / eliminate the cough itself for at least 3 days with use of non-mint/menthol hard rock candies and control pnds with flonase  and zyxal.   If not 100% satisfied in 6 weeks rec call for referral to ENT           Each maintenance medication was reviewed in detail including emphasizing most importantly the difference between maintenance and prns and under what circumstances the prns are to be triggered using an action plan format where appropriate.  Total time for H and P, chart review, counseling, reviewing nasal/hfa device(s) and generating customized AVS unique to this office visit / same day charting = 48 min new pt with   refractory respiratory  symptoms of uncertain etiology             AVS  Patient Instructions  Nexium  40 mg Take 30-60 min before first meal of the day and Pepcid 20 mg after supper x 6 weeks to see if it helps your sense of drainage - if not I'm happy to refer you to [facility]/ voice center Dr Garnette Silvan    GERD (REFLUX)  is an extremely common cause of respiratory symptoms just like yours , many times with no obvious heartburn at all.    It can be treated with medication, but also with lifestyle changes including elevation of the head of your bed (ideally with 6-8inch blocks under the headboard of your bed),  Smoking cessation, avoidance of late meals, excessive alcohol, and avoid fatty foods, chocolate, peppermint, colas, red wine, and acidic juices such as orange juice.  NO MINT OR MENTHOL PRODUCTS SO NO COUGH DROPS  USE SUGARLESS CANDY INSTEAD (Jolley ranchers or Stover's or Life Savers) or even ice chips will also do - the key is to swallow to prevent all throat clearing. NO OIL BASED VITAMINS - use powdered substitutes.  Avoid fish oil when coughing.   You will need a follow up CT chest in 6 months to follow up your PET scan but I strongly doubt this will show any growth in the nodules which have probably been there for years and are not at all unusual benign  findings  Pulmonary follow up is as needed     Ozell America, MD 10/24/2024

## 2024-10-24 NOTE — Assessment & Plan Note (Addendum)
 Onset ? How remote with neg allergy w/u by [facility] x for dust - Acutely worse in setting of URI since early Nov 2025 with residual globus - 10/24/2024 rec max gerd rx x 6 weeks then consider refer to Dr Brien at [facility] voice center if persists   Upper airway cough syndrome (previously labeled PNDS),  is so named because it's frequently impossible to sort out how much is  CR/sinusitis with freq throat clearing (which can be related to primary GERD)   vs  causing  secondary ( extra esophageal)  GERD from wide swings in gastric pressure that occur with throat clearing, often  promoting self use of mint and menthol lozenges that reduce the lower esophageal sphincter tone and exacerbate the problem further in a cyclical fashion.   These are the same pts (now being labeled as having irritable larynx syndrome by some cough centers) who not infrequently have a history of having failed to tolerate ace inhibitors,  dry powder inhalers or biphosphonates or report having atypical/extraesophageal reflux symptoms from LPR (globus, throat clearing)  that don't respond to standard doses of PPI  and are easily confused as having aecopd or asthma flares by even experienced allergists/ pulmonologists (myself included).   Of the three most common causes of  Sub-acute / recurrent or chronic cough, only one (GERD)  can actually contribute to/ trigger  the other two (asthma and post nasal drip syndrome)  and perpetuate the cylce of cough.  While not intuitively obvious, many patients with chronic low grade reflux do not cough until there is a primary insult that disturbs the protective epithelial barrier and exposes sensitive nerve endings.   This is typically viral but can due to PNDS and  either may apply here.    >>>   The point is that once this occurs, it is difficult to eliminate the cycle  using anything but a maximally effective acid suppression regimen at least in the short run, accompanied by an appropriate diet to  address non acid GERD and control / eliminate the cough itself for at least 3 days with use of non-mint/menthol hard rock candies and control pnds with flonase  and zyxal.   If not 100% satisfied in 6 weeks rec call for referral to ENT          Each maintenance medication was reviewed in detail including emphasizing most importantly the difference between maintenance and prns and under what circumstances the prns are to be triggered using an action plan format where appropriate.  Total time for H and P, chart review, counseling, reviewing nasal/hfa device(s) and generating customized AVS unique to this office visit / same day charting = 48 min new pt with   refractory respiratory  symptoms of uncertain etiology

## 2024-10-24 NOTE — Progress Notes (Signed)
 [facility]   [phone] [phone]   Clinic Follow up Note   Patient Care Team: Severa Rock HERO, FNP as PCP - General (Family Medicine) Gail Favorite, MD as Consulting Physician (General Surgery) Lanny Callander, MD as Consulting Physician (Hematology) Shannon Agent, MD as Consulting Physician (Radiation Oncology) Burton, Lacie K, NP as Nurse Practitioner (Nurse Practitioner) 10/24/2024  I connected with [name] on 10/24/2024 at  8:00 AM EST by telephone and verified that I am speaking with the correct person using two identifiers.   I discussed the limitations, risks, security and privacy concerns of performing an evaluation and management service by telephone and the availability of in person appointments. I also discussed with the patient that there may be a patient responsible charge related to this service. The patient expressed understanding and agreed to proceed.   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 Grade I -diagnosed 07/2018, s/p left breast lumpectomy and adjuvant radiation. Due to small tumor size, oncotype was not recommended.  -She discontinued tamoxifen  after 10 months due to arthritis. She is on surveillance now -continue annual mammogram  -continue surveillance   Assessment & Plan Lung nodule, right lung The right lung nodule demonstrated no abnormal uptake on PET scan, which, despite limited sensitivity due to small size, is reassuring for benign etiology. She is a never-smoker. Pulmonology attributed her symptoms to viral illness and gastroesophageal reflux, with anticipated resolution as reflux is managed. - Ordered follow-up chest CT in six  months to monitor the nodule.  Estrogen receptor positive breast cancer of the left upper-inner quadrant, in remission She remains in remission with no evidence of recurrence. Guardian Reveal blood test was negative for circulating breast cancer cells. Current chest pain is not attributed to breast cancer. - Reviewed Guardian Reveal blood test results, which were negative for circulating breast cancer cells. - Reassured regarding absence of evidence for breast cancer recurrence. - Scheduled follow-up visit one week after the next CT scan.  Plan - I personally reviewed her PET scan images and discussed the findings with her, her guardian reveal was also negative, I do not have suspicion for cancer recurrence at this point. - Continue breast cancer surveillance, follow-up in 6 months with lab and repeated CT chest 1 week before   SUMMARY OF ONCOLOGIC HISTORY: Oncology History Overview Note  Cancer Staging Malignant neoplasm of upper-inner quadrant of left breast in female, estrogen receptor positive (HCC) Staging form: Breast, AJCC 8th Edition - Clinical stage from 07/23/2018: Stage IA (cT1b, cN0, cM0, G1, ER+, PR+, HER2-) - Signed by Lanny Callander, MD on 08/01/2018 - Pathologic stage from 08/31/2018: Stage IA (pT1b, pN0, cM0, G1, ER+, PR+, HER2-) - Signed by Lanny Callander, MD on 11/26/2018     Malignant neoplasm of upper-inner quadrant of left breast in female, estrogen receptor positive (HCC)  07/13/2018 Mammogram   07/13/2018 Screening Mammogram IMPRESSION: Further evaluation is suggested for possible distortion in the left breast   07/19/2018 Breast US     07/19/2018 Breast US  IMPRESSION: 0.6 cm irregular mass with associated architectural distortion in the 9:30 position of the left breast 4 cm from the nipple. Findings are suspicious for malignancy.   Negative for left axillary lymphadenopathy  07/19/2018 Mammogram   07/19/2018 Diagnostic Mammogram IMPRESSION: 0.6 cm irregular mass with  associated architectural distortion in the 9:30 position of the left breast 4 cm from the nipple. Findings are suspicious for malignancy.   Negative for left axillary lymphadenopathy.   07/23/2018 Cancer Staging   Staging form: Breast, AJCC 8th Edition - Clinical stage from 07/23/2018: Stage IA (cT1b, cN0, cM0, G1, ER+, PR+, HER2-) - Signed by Lanny Callander, MD on 08/01/2018   07/23/2018 Receptors her2   ADDITIONAL INFORMATION: PROGNOSTIC INDICATORS Results: IMMUNOHISTOCHEMICAL AND MORPHOMETRIC ANALYSIS PERFORMED MANUALLY The tumor cells are Negative for Her2 (1+). Estrogen Receptor: 90%, POSITIVE, STRONG STAINING INTENSITY Progesterone Receptor: 90%, POSITIVE, STRONG STAINING INTENSITY Proliferation Marker Ki67: 10%   07/23/2018 Pathology Results   Diagnosis Breast, left, needle core biopsy, 9:30 o'clock, 4 cm fn - INVASIVE DUCTAL CARCINOMA, GRADE I. SEE NOTE.   07/27/2018 Initial Diagnosis   Malignant neoplasm of upper-inner quadrant of left breast in female, estrogen receptor positive (HCC)   08/16/2018 Genetic Testing   The Multi-Cancer Panel offered by [facility] includes sequencing and/or deletion duplication testing of the following 91 genes: AIP, ALK, APC, ATM, AXIN2, BAP1, BARD1, BLM, BMPR1A, BRCA1, BRCA2, BRIP1, BUB1B, CASR, CDC73, CDH1, CDK4, CDKN1B, CDKN1C, CDKN2A, CEBPA, CEP57, CHEK2, CTNNA1, DICER1, DIS3L2, EGFR, ENG, EPCAM, FH, FLCN, GALNT12, GATA2, GPC3, GREM1, HOXB13, HRAS, KIT, MAX, MEN1, MET, MITF, MLH1, MLH3, MSH2, MSH3, MSH6, MUTYH, NBN, NF1, NF2, NTHL1, PALB2, PDGFRA, PHOX2B, PMS2, POLD1, POLE, POT1, PRKAR1A, PTCH1, PTEN, RAD50, RAD51C, RAD51D, RB1, RECQL4, RET, RNF43, RPS20, RUNX1, SDHA, SDHAF2, SDHB, SDHC, SDHD, SMAD4, SMARCA4, SMARCB1, SMARCE1, STK11, SUFU, TERC, TERT, TMEM127, TP53, TSC1, TSC2, VHL, WRN, WT1  Results: Negative, no pathogenic variants identified.  The date of this test report is 08/16/2018.    08/31/2018 Surgery   LEFT DOUBLE BRACKETED LUMPECTOMY WITH  RADIOACTIVE SEED X'S 2, LEFT SENTINEL LYMPH NODE BIOPSY, INJECT BLUE DYE LEFT BREAST by Dr. Gail 08/31/18   08/31/2018 Pathology Results   Diagnosis 08/31/18  1. Breast, lumpectomy, left - INVASIVE DUCTAL CARCINOMA, [place] GRADE 1 OF 3, 0.7 CM - CALCIFICATIONS ASSOCIATED WITH CARCINOMA - MARGINS UNINVOLVED BY CARCINOMA (0.5 CM; POSTERIOR MARGIN) - COLUMNAR CELL AND FIBROCYSTIC CHANGES INCLUDING APOCRINE METAPLASIA - PSEUDOANGIOMATOUS STROMAL HYPERPLASIA - PREVIOUS BIOPSY SITE CHANGES PRESENT - SEE ONCOLOGY TABLE AND COMMENT BELOW 2. Lymph node, sentinel, biopsy, left axillary #1 - NO CARCINOMA IDENTIFIED IN ONE LYMPH NODE (0/1) 3. Lymph node, sentinel, biopsy, left axillary #2 - NO CARCINOMA IDENTIFIED IN ONE LYMPH NODE (0/1) 4. Lymph node, sentinel, biopsy, left axillary #3 - NO CARCINOMA IDENTIFIED IN ONE LYMPH NODE (0/1)   08/31/2018 Cancer Staging   Staging form: Breast, AJCC 8th Edition - Pathologic stage from 08/31/2018: Stage IA (pT1b, pN0, cM0, G1, ER+, PR+, HER2-) - Signed by Lanny Callander, MD on 11/26/2018   10/30/2018 - 11/28/2018 Radiation Therapy   Adjuvant Radiation with Dr. Brigid 10/30/18-11/28/18   11/2018 - 09/2019 Anti-estrogen oral therapy   Tamoxifen  20mg  daily started 11/2018. Stopped in 09/2019 due to arthritis. She opted to not restart.    06/06/2019 Survivorship   Per Mayme Silversmith, NP       Discussed the use of AI scribe software for clinical note transcription with the patient, who gave verbal consent to proceed.  History of Present Illness Dawn Rogers is a 63 year old female with estrogen receptor positive breast cancer, status post breast surgery, presenting for review of PET scan and laboratory results.  She underwent a recent PET scan to evaluate a  solitary right pulmonary nodule, which showed no abnormal uptake. She has never smoked.  A recent Guardian Reveal blood test was negative for circulating breast cancer cells.  She has chest pain  that has not been attributed to malignancy. Pulmonology recently evaluated her and suspects reflux as the cause despite lack of typical symptoms, and she is currently on reflux treatment.     REVIEW OF SYSTEMS:   Constitutional: Denies fevers, chills or abnormal weight loss Eyes: Denies blurriness of vision Ears, nose, mouth, throat, and face: Denies mucositis or sore throat Respiratory: Denies cough, dyspnea or wheezes Cardiovascular: Denies palpitation, chest discomfort or lower extremity swelling Gastrointestinal:  Denies nausea, heartburn or change in bowel habits Skin: Denies abnormal skin rashes Lymphatics: Denies new lymphadenopathy or easy bruising Neurological:Denies numbness, tingling or new weaknesses Behavioral/Psych: Mood is stable, no new changes  All other systems were reviewed with the patient and are negative.  MEDICAL HISTORY:  Past Medical History:  Diagnosis Date   Anxiety    Arthritis Last year   Cancer (HCC) 08/02/2018   left breast cancer   Family history of breast cancer    Family history of esophageal cancer    Family history of lymphoma    Family history of ovarian cancer    GERD (gastroesophageal reflux disease)    Hypothyroidism    Neck pain    Neuromuscular disorder (HCC) Around 2010   Had Pinched nerves for several years   Personal history of radiation therapy     SURGICAL HISTORY: Past Surgical History:  Procedure Laterality Date   ANKLE FRACTURE SURGERY Left    BREAST LUMPECTOMY Left 08/2018   BREAST LUMPECTOMY WITH RADIOACTIVE SEED AND SENTINEL LYMPH NODE BIOPSY Left 08/31/2018   Procedure: LEFT DOUBLE BRACKETED LUMPECTOMY WITH RADIOACTIVE SEED X'S 2, LEFT SENTINEL LYMPH NODE BIOPSY, INJECT BLUE DYE LEFT BREAST;  Surgeon: Gail Favorite, MD;  Location: [facility];  Service: General;  Laterality: Left;   CHOLECYSTECTOMY     TUBAL LIGATION      I have reviewed the social history and family history with the patient and they  are unchanged from previous note.  ALLERGIES:  has no known allergies.  MEDICATIONS:  Current Outpatient Medications  Medication Sig Dispense Refill   albuterol  (VENTOLIN  HFA) 108 (90 Base) MCG/ACT inhaler Inhale 2 puffs into the lungs every 6 (six) hours as needed for wheezing or shortness of breath. 8 g 0   cyclobenzaprine  (FLEXERIL ) 5 MG tablet Take 1 tablet (5 mg total) by mouth 3 (three) times daily as needed for muscle spasms. 30 tablet 6   esomeprazole  (NEXIUM ) 40 MG capsule Take 1 capsule (40 mg total) by mouth daily. 90 capsule 1   FLUoxetine  (PROZAC ) 10 MG capsule Take 1 capsule (10 mg total) by mouth daily. 90 capsule 1   fluticasone  (FLONASE ) 50 MCG/ACT nasal spray SPRAY 2 SPRAYS INTO EACH NOSTRIL EVERY DAY 48 mL 2   levocetirizine (XYZAL ) 5 MG tablet Take 1 tablet (5 mg total) by mouth every evening. 90 tablet 1   levothyroxine  (SYNTHROID ) 25 MCG tablet Take 1 tablet (25 mcg total) by mouth daily before breakfast. 90 tablet 3   Magnesium  Citrate (MAGNESIUM  GUMMIES PO) Take by mouth.     Nasal Dilators (NASAL STRIPS) STRP 1 strip by Does not apply route at bedtime. Breathing strips     polyethylene glycol powder (GLYCOLAX /MIRALAX ) 17 GM/SCOOP powder Take 17 g by mouth daily. (Patient not taking: Reported on 10/24/2024) 3350 g 1  Vitamin D , Ergocalciferol , (DRISDOL ) 1.25 MG (50000 UNIT) CAPS capsule TAKE 1 CAPSULE (50,000 UNITS TOTAL) BY MOUTH EVERY 7 (SEVEN) DAYS 12 capsule 0   No current facility-administered medications for this visit.    PHYSICAL EXAMINATION: Not performed   LABORATORY DATA:  I have reviewed the data as listed    Latest Ref Rng & Units 09/24/2024    1:07 PM 06/18/2024   10:41 AM 12/12/2023   10:49 AM  CBC  WBC 4.0 - 10.5 K/uL 5.5  5.0  4.5   Hemoglobin 12.0 - 15.0 g/dL 88.6  86.6  87.2   Hematocrit 36.0 - 46.0 % 34.6  41.4  38.2   Platelets 150 - 400 K/uL 277  268  286         Latest Ref Rng & Units 09/24/2024    1:07 PM 06/18/2024   10:41 AM  12/12/2023   10:49 AM  CMP  Glucose 70 - 99 mg/dL 95  92  90   BUN 8 - 23 mg/dL 12  12  8    Creatinine 0.44 - 1.00 mg/dL 9.04  9.27  9.20   Sodium 135 - 145 mmol/L 135  136  137   Potassium 3.5 - 5.1 mmol/L 4.9  4.4  4.2   Chloride 98 - 111 mmol/L 101  102  99   CO2 22 - 32 mmol/L 25  18  21    Calcium 8.9 - 10.3 mg/dL 9.3  9.7  8.9   Total Protein 6.5 - 8.1 g/dL 6.5  6.2  6.0   Total Bilirubin 0.0 - 1.2 mg/dL 0.4  0.2  0.2   Alkaline Phos 38 - 126 U/L 59  63  66   AST 15 - 41 U/L 26  22  26    ALT 0 - 44 U/L 21  20  18        RADIOGRAPHIC STUDIES: I have personally reviewed the radiological images as listed and agreed with the findings in the report. No results found.     I discussed the assessment and treatment plan with the patient. The patient was provided an opportunity to ask questions and all were answered. The patient agreed with the plan and demonstrated an understanding of the instructions.   The patient was advised to call back or seek an in-person evaluation if the symptoms worsen or if the condition fails to improve as anticipated.  I provided 25 minutes of non face-to-face telephone visit time during this encounter, including review of chart and various tests results, discussions about plan of care and coordination of care plan.    Onita Mattock, MD 10/24/2024

## 2024-10-24 NOTE — Patient Instructions (Signed)
 Nexium  40 mg Take 30-60 min before first meal of the day and Pepcid 20 mg after supper x 6 weeks to see if it helps your sense of drainage - if not I'm happy to refer you to [facility]/ voice center Dr Garnette Silvan    GERD (REFLUX)  is an extremely common cause of respiratory symptoms just like yours , many times with no obvious heartburn at all.    It can be treated with medication, but also with lifestyle changes including elevation of the head of your bed (ideally with 6-8inch blocks under the headboard of your bed),  Smoking cessation, avoidance of late meals, excessive alcohol, and avoid fatty foods, chocolate, peppermint, colas, red wine, and acidic juices such as orange juice.  NO MINT OR MENTHOL PRODUCTS SO NO COUGH DROPS  USE SUGARLESS CANDY INSTEAD (Jolley ranchers or Stover's or Life Savers) or even ice chips will also do - the key is to swallow to prevent all throat clearing. NO OIL BASED VITAMINS - use powdered substitutes.  Avoid fish oil when coughing.   You will need a follow up CT chest in 6 months to follow up your PET scan but I strongly doubt this will show any growth in the nodules which have probably been there for years and are not at all unusual benign  findings  Pulmonary follow up is as needed

## 2024-11-11 ENCOUNTER — Other Ambulatory Visit: Payer: 59

## 2024-11-11 ENCOUNTER — Ambulatory Visit: Payer: 59 | Admitting: Nurse Practitioner

## 2024-11-13 ENCOUNTER — Other Ambulatory Visit: Payer: Self-pay

## 2024-11-14 ENCOUNTER — Telehealth (HOSPITAL_COMMUNITY): Payer: Self-pay | Admitting: *Deleted

## 2024-11-14 NOTE — Telephone Encounter (Signed)
 Left message on voicemail as a reminder about a ETT scheduled for 11/21/24 at 1:45.

## 2024-11-19 ENCOUNTER — Inpatient Hospital Stay: Admitting: Hematology

## 2024-11-21 ENCOUNTER — Ambulatory Visit (HOSPITAL_COMMUNITY)
Admission: RE | Admit: 2024-11-21 | Discharge: 2024-11-21 | Disposition: A | Source: Ambulatory Visit | Attending: Internal Medicine | Admitting: Internal Medicine

## 2024-11-21 ENCOUNTER — Ambulatory Visit (HOSPITAL_COMMUNITY)
Admission: RE | Admit: 2024-11-21 | Discharge: 2024-11-21 | Disposition: A | Discharge status: Home / Self Care | Source: Ambulatory Visit | Attending: Internal Medicine | Admitting: Internal Medicine

## 2024-11-21 DIAGNOSIS — T733XXA Exhaustion due to excessive exertion, initial encounter: Secondary | ICD-10-CM | POA: Insufficient documentation

## 2024-11-21 DIAGNOSIS — R0609 Other forms of dyspnea: Secondary | ICD-10-CM

## 2024-11-21 LAB — EXERCISE TOLERANCE TEST
Angina Index: 0
Estimated workload: 8.5
Exercise duration (min): 7 min
Exercise duration (sec): 0 s
MPHR: 158 {beats}/min
Peak HR: 155 {beats}/min
Percent HR: 98 %
Rest HR: 78 {beats}/min

## 2024-11-21 LAB — ECHOCARDIOGRAM COMPLETE
Area-P 1/2: 3.91 cm2
S' Lateral: 2.2 cm

## 2025-04-16 ENCOUNTER — Inpatient Hospital Stay

## 2025-04-23 ENCOUNTER — Inpatient Hospital Stay: Admitting: Hematology

## 2025-06-20 ENCOUNTER — Encounter: Payer: Self-pay | Admitting: Family Medicine
